# Patient Record
Sex: Female | Born: 1935 | Race: White | Hispanic: No | Smoking: Never smoker
Health system: Southern US, Community
[De-identification: ages and names within clinical notes are randomized; demographics above are authoritative.]

## PROBLEM LIST (undated history)

## (undated) DIAGNOSIS — F419 Anxiety disorder, unspecified: Secondary | ICD-10-CM

## (undated) DIAGNOSIS — I2510 Atherosclerotic heart disease of native coronary artery without angina pectoris: Secondary | ICD-10-CM

## (undated) DIAGNOSIS — E119 Type 2 diabetes mellitus without complications: Secondary | ICD-10-CM

## (undated) DIAGNOSIS — I10 Essential (primary) hypertension: Secondary | ICD-10-CM

## (undated) DIAGNOSIS — N289 Disorder of kidney and ureter, unspecified: Secondary | ICD-10-CM

## (undated) DIAGNOSIS — K219 Gastro-esophageal reflux disease without esophagitis: Secondary | ICD-10-CM

## (undated) HISTORY — PX: CATARACT EXTRACTION: SUR2

---

## 2011-05-16 DIAGNOSIS — Z8249 Family history of ischemic heart disease and other diseases of the circulatory system: Secondary | ICD-10-CM | POA: Insufficient documentation

## 2011-05-16 DIAGNOSIS — E789 Disorder of lipoprotein metabolism, unspecified: Secondary | ICD-10-CM | POA: Insufficient documentation

## 2012-05-07 DIAGNOSIS — M3501 Sicca syndrome with keratoconjunctivitis: Secondary | ICD-10-CM | POA: Insufficient documentation

## 2012-05-07 DIAGNOSIS — H16223 Keratoconjunctivitis sicca, not specified as Sjogren's, bilateral: Secondary | ICD-10-CM | POA: Insufficient documentation

## 2012-05-07 DIAGNOSIS — H4010X0 Unspecified open-angle glaucoma, stage unspecified: Secondary | ICD-10-CM | POA: Insufficient documentation

## 2012-05-07 DIAGNOSIS — H02889 Meibomian gland dysfunction of unspecified eye, unspecified eyelid: Secondary | ICD-10-CM | POA: Insufficient documentation

## 2015-08-31 DIAGNOSIS — R0600 Dyspnea, unspecified: Secondary | ICD-10-CM | POA: Insufficient documentation

## 2015-09-01 DIAGNOSIS — R251 Tremor, unspecified: Secondary | ICD-10-CM | POA: Insufficient documentation

## 2015-10-06 DIAGNOSIS — I472 Ventricular tachycardia, unspecified: Secondary | ICD-10-CM | POA: Insufficient documentation

## 2015-11-15 DIAGNOSIS — R0600 Dyspnea, unspecified: Secondary | ICD-10-CM | POA: Diagnosis not present

## 2015-11-15 DIAGNOSIS — E119 Type 2 diabetes mellitus without complications: Secondary | ICD-10-CM | POA: Diagnosis not present

## 2015-11-15 DIAGNOSIS — I471 Supraventricular tachycardia: Secondary | ICD-10-CM | POA: Diagnosis not present

## 2015-11-15 DIAGNOSIS — I472 Ventricular tachycardia: Secondary | ICD-10-CM | POA: Diagnosis not present

## 2015-11-15 DIAGNOSIS — Z6831 Body mass index (BMI) 31.0-31.9, adult: Secondary | ICD-10-CM | POA: Diagnosis not present

## 2015-11-15 DIAGNOSIS — R251 Tremor, unspecified: Secondary | ICD-10-CM | POA: Diagnosis not present

## 2015-11-15 DIAGNOSIS — E785 Hyperlipidemia, unspecified: Secondary | ICD-10-CM | POA: Diagnosis not present

## 2015-11-15 DIAGNOSIS — I10 Essential (primary) hypertension: Secondary | ICD-10-CM | POA: Diagnosis not present

## 2015-11-26 DIAGNOSIS — R42 Dizziness and giddiness: Secondary | ICD-10-CM | POA: Diagnosis not present

## 2015-11-26 DIAGNOSIS — H6901 Patulous Eustachian tube, right ear: Secondary | ICD-10-CM | POA: Diagnosis not present

## 2015-11-26 DIAGNOSIS — H903 Sensorineural hearing loss, bilateral: Secondary | ICD-10-CM | POA: Diagnosis not present

## 2015-11-26 DIAGNOSIS — H9313 Tinnitus, bilateral: Secondary | ICD-10-CM | POA: Diagnosis not present

## 2015-12-20 DIAGNOSIS — J019 Acute sinusitis, unspecified: Secondary | ICD-10-CM | POA: Diagnosis not present

## 2015-12-20 DIAGNOSIS — E119 Type 2 diabetes mellitus without complications: Secondary | ICD-10-CM | POA: Diagnosis not present

## 2015-12-20 DIAGNOSIS — Z719 Counseling, unspecified: Secondary | ICD-10-CM | POA: Diagnosis not present

## 2016-01-25 DIAGNOSIS — E113291 Type 2 diabetes mellitus with mild nonproliferative diabetic retinopathy without macular edema, right eye: Secondary | ICD-10-CM | POA: Diagnosis not present

## 2016-01-25 DIAGNOSIS — Z961 Presence of intraocular lens: Secondary | ICD-10-CM | POA: Diagnosis not present

## 2016-01-25 DIAGNOSIS — H35372 Puckering of macula, left eye: Secondary | ICD-10-CM | POA: Diagnosis not present

## 2016-01-25 DIAGNOSIS — H2640 Unspecified secondary cataract: Secondary | ICD-10-CM | POA: Diagnosis not present

## 2016-01-25 DIAGNOSIS — H16223 Keratoconjunctivitis sicca, not specified as Sjogren's, bilateral: Secondary | ICD-10-CM | POA: Diagnosis not present

## 2016-01-26 DIAGNOSIS — G500 Trigeminal neuralgia: Secondary | ICD-10-CM | POA: Diagnosis not present

## 2016-01-26 DIAGNOSIS — R5383 Other fatigue: Secondary | ICD-10-CM | POA: Diagnosis not present

## 2016-01-26 DIAGNOSIS — I10 Essential (primary) hypertension: Secondary | ICD-10-CM | POA: Diagnosis not present

## 2016-01-26 DIAGNOSIS — E118 Type 2 diabetes mellitus with unspecified complications: Secondary | ICD-10-CM | POA: Diagnosis not present

## 2016-01-26 DIAGNOSIS — E7800 Pure hypercholesterolemia, unspecified: Secondary | ICD-10-CM | POA: Diagnosis not present

## 2016-01-26 DIAGNOSIS — R5382 Chronic fatigue, unspecified: Secondary | ICD-10-CM | POA: Insufficient documentation

## 2016-02-23 DIAGNOSIS — R2689 Other abnormalities of gait and mobility: Secondary | ICD-10-CM | POA: Diagnosis not present

## 2016-02-23 DIAGNOSIS — E1142 Type 2 diabetes mellitus with diabetic polyneuropathy: Secondary | ICD-10-CM | POA: Diagnosis not present

## 2016-03-02 DIAGNOSIS — M791 Myalgia: Secondary | ICD-10-CM | POA: Diagnosis not present

## 2016-03-02 DIAGNOSIS — G500 Trigeminal neuralgia: Secondary | ICD-10-CM | POA: Diagnosis not present

## 2016-03-06 DIAGNOSIS — R5381 Other malaise: Secondary | ICD-10-CM | POA: Insufficient documentation

## 2016-03-06 DIAGNOSIS — I10 Essential (primary) hypertension: Secondary | ICD-10-CM | POA: Diagnosis not present

## 2016-03-06 DIAGNOSIS — R251 Tremor, unspecified: Secondary | ICD-10-CM | POA: Diagnosis not present

## 2016-03-06 DIAGNOSIS — Z6831 Body mass index (BMI) 31.0-31.9, adult: Secondary | ICD-10-CM | POA: Diagnosis not present

## 2016-03-06 DIAGNOSIS — E785 Hyperlipidemia, unspecified: Secondary | ICD-10-CM | POA: Diagnosis not present

## 2016-03-06 DIAGNOSIS — I471 Supraventricular tachycardia: Secondary | ICD-10-CM | POA: Diagnosis not present

## 2016-03-06 DIAGNOSIS — I472 Ventricular tachycardia: Secondary | ICD-10-CM | POA: Diagnosis not present

## 2016-03-06 DIAGNOSIS — R5383 Other fatigue: Secondary | ICD-10-CM | POA: Diagnosis not present

## 2016-03-09 DIAGNOSIS — R7989 Other specified abnormal findings of blood chemistry: Secondary | ICD-10-CM | POA: Diagnosis not present

## 2016-03-09 DIAGNOSIS — R0600 Dyspnea, unspecified: Secondary | ICD-10-CM | POA: Diagnosis not present

## 2016-03-09 DIAGNOSIS — R791 Abnormal coagulation profile: Secondary | ICD-10-CM | POA: Diagnosis not present

## 2016-03-17 DIAGNOSIS — I472 Ventricular tachycardia: Secondary | ICD-10-CM | POA: Diagnosis not present

## 2016-03-17 DIAGNOSIS — I471 Supraventricular tachycardia: Secondary | ICD-10-CM | POA: Diagnosis not present

## 2016-03-17 DIAGNOSIS — I10 Essential (primary) hypertension: Secondary | ICD-10-CM | POA: Diagnosis not present

## 2016-04-03 DIAGNOSIS — M5442 Lumbago with sciatica, left side: Secondary | ICD-10-CM | POA: Diagnosis not present

## 2016-04-03 DIAGNOSIS — G500 Trigeminal neuralgia: Secondary | ICD-10-CM | POA: Diagnosis not present

## 2016-04-03 DIAGNOSIS — R531 Weakness: Secondary | ICD-10-CM | POA: Diagnosis not present

## 2016-04-03 DIAGNOSIS — K210 Gastro-esophageal reflux disease with esophagitis: Secondary | ICD-10-CM | POA: Diagnosis not present

## 2016-04-03 DIAGNOSIS — G8929 Other chronic pain: Secondary | ICD-10-CM | POA: Diagnosis not present

## 2016-04-03 DIAGNOSIS — M25552 Pain in left hip: Secondary | ICD-10-CM | POA: Diagnosis not present

## 2016-04-03 DIAGNOSIS — Z0000 Encounter for general adult medical examination without abnormal findings: Secondary | ICD-10-CM | POA: Diagnosis not present

## 2016-04-03 DIAGNOSIS — R609 Edema, unspecified: Secondary | ICD-10-CM | POA: Diagnosis not present

## 2016-04-03 DIAGNOSIS — I509 Heart failure, unspecified: Secondary | ICD-10-CM | POA: Diagnosis not present

## 2016-04-03 DIAGNOSIS — M5441 Lumbago with sciatica, right side: Secondary | ICD-10-CM | POA: Diagnosis not present

## 2016-04-03 DIAGNOSIS — M25551 Pain in right hip: Secondary | ICD-10-CM | POA: Diagnosis not present

## 2016-04-25 DIAGNOSIS — I509 Heart failure, unspecified: Secondary | ICD-10-CM | POA: Diagnosis not present

## 2016-04-25 DIAGNOSIS — I110 Hypertensive heart disease with heart failure: Secondary | ICD-10-CM | POA: Diagnosis not present

## 2016-04-25 DIAGNOSIS — E1142 Type 2 diabetes mellitus with diabetic polyneuropathy: Secondary | ICD-10-CM | POA: Diagnosis not present

## 2016-04-25 DIAGNOSIS — Z7984 Long term (current) use of oral hypoglycemic drugs: Secondary | ICD-10-CM | POA: Diagnosis not present

## 2016-04-27 DIAGNOSIS — I509 Heart failure, unspecified: Secondary | ICD-10-CM | POA: Diagnosis not present

## 2016-04-27 DIAGNOSIS — I110 Hypertensive heart disease with heart failure: Secondary | ICD-10-CM | POA: Diagnosis not present

## 2016-04-27 DIAGNOSIS — E1142 Type 2 diabetes mellitus with diabetic polyneuropathy: Secondary | ICD-10-CM | POA: Diagnosis not present

## 2016-04-27 DIAGNOSIS — Z7984 Long term (current) use of oral hypoglycemic drugs: Secondary | ICD-10-CM | POA: Diagnosis not present

## 2016-04-28 DIAGNOSIS — E1142 Type 2 diabetes mellitus with diabetic polyneuropathy: Secondary | ICD-10-CM | POA: Diagnosis not present

## 2016-04-28 DIAGNOSIS — I509 Heart failure, unspecified: Secondary | ICD-10-CM | POA: Diagnosis not present

## 2016-04-28 DIAGNOSIS — Z7984 Long term (current) use of oral hypoglycemic drugs: Secondary | ICD-10-CM | POA: Diagnosis not present

## 2016-04-28 DIAGNOSIS — I110 Hypertensive heart disease with heart failure: Secondary | ICD-10-CM | POA: Diagnosis not present

## 2016-05-01 DIAGNOSIS — Z7984 Long term (current) use of oral hypoglycemic drugs: Secondary | ICD-10-CM | POA: Diagnosis not present

## 2016-05-01 DIAGNOSIS — I509 Heart failure, unspecified: Secondary | ICD-10-CM | POA: Diagnosis not present

## 2016-05-01 DIAGNOSIS — I110 Hypertensive heart disease with heart failure: Secondary | ICD-10-CM | POA: Diagnosis not present

## 2016-05-01 DIAGNOSIS — E1142 Type 2 diabetes mellitus with diabetic polyneuropathy: Secondary | ICD-10-CM | POA: Diagnosis not present

## 2016-05-04 DIAGNOSIS — E1142 Type 2 diabetes mellitus with diabetic polyneuropathy: Secondary | ICD-10-CM | POA: Diagnosis not present

## 2016-05-04 DIAGNOSIS — Z7984 Long term (current) use of oral hypoglycemic drugs: Secondary | ICD-10-CM | POA: Diagnosis not present

## 2016-05-04 DIAGNOSIS — I110 Hypertensive heart disease with heart failure: Secondary | ICD-10-CM | POA: Diagnosis not present

## 2016-05-04 DIAGNOSIS — I509 Heart failure, unspecified: Secondary | ICD-10-CM | POA: Diagnosis not present

## 2016-05-09 DIAGNOSIS — I110 Hypertensive heart disease with heart failure: Secondary | ICD-10-CM | POA: Diagnosis not present

## 2016-05-09 DIAGNOSIS — E1142 Type 2 diabetes mellitus with diabetic polyneuropathy: Secondary | ICD-10-CM | POA: Diagnosis not present

## 2016-05-09 DIAGNOSIS — I509 Heart failure, unspecified: Secondary | ICD-10-CM | POA: Diagnosis not present

## 2016-05-09 DIAGNOSIS — Z7984 Long term (current) use of oral hypoglycemic drugs: Secondary | ICD-10-CM | POA: Diagnosis not present

## 2016-05-11 DIAGNOSIS — I110 Hypertensive heart disease with heart failure: Secondary | ICD-10-CM | POA: Diagnosis not present

## 2016-05-11 DIAGNOSIS — E1142 Type 2 diabetes mellitus with diabetic polyneuropathy: Secondary | ICD-10-CM | POA: Diagnosis not present

## 2016-05-11 DIAGNOSIS — I509 Heart failure, unspecified: Secondary | ICD-10-CM | POA: Diagnosis not present

## 2016-05-11 DIAGNOSIS — Z7984 Long term (current) use of oral hypoglycemic drugs: Secondary | ICD-10-CM | POA: Diagnosis not present

## 2016-05-15 DIAGNOSIS — Z888 Allergy status to other drugs, medicaments and biological substances status: Secondary | ICD-10-CM | POA: Diagnosis not present

## 2016-05-15 DIAGNOSIS — I509 Heart failure, unspecified: Secondary | ICD-10-CM | POA: Diagnosis not present

## 2016-05-15 DIAGNOSIS — Z7984 Long term (current) use of oral hypoglycemic drugs: Secondary | ICD-10-CM | POA: Diagnosis not present

## 2016-05-15 DIAGNOSIS — M3501 Sicca syndrome with keratoconjunctivitis: Secondary | ICD-10-CM | POA: Diagnosis not present

## 2016-05-15 DIAGNOSIS — E113299 Type 2 diabetes mellitus with mild nonproliferative diabetic retinopathy without macular edema, unspecified eye: Secondary | ICD-10-CM | POA: Diagnosis not present

## 2016-05-15 DIAGNOSIS — Z7982 Long term (current) use of aspirin: Secondary | ICD-10-CM | POA: Diagnosis not present

## 2016-05-15 DIAGNOSIS — E7800 Pure hypercholesterolemia, unspecified: Secondary | ICD-10-CM | POA: Diagnosis not present

## 2016-05-15 DIAGNOSIS — Z79899 Other long term (current) drug therapy: Secondary | ICD-10-CM | POA: Diagnosis not present

## 2016-05-15 DIAGNOSIS — E1140 Type 2 diabetes mellitus with diabetic neuropathy, unspecified: Secondary | ICD-10-CM | POA: Diagnosis not present

## 2016-05-15 DIAGNOSIS — H538 Other visual disturbances: Secondary | ICD-10-CM | POA: Diagnosis not present

## 2016-05-15 DIAGNOSIS — Z961 Presence of intraocular lens: Secondary | ICD-10-CM | POA: Diagnosis not present

## 2016-05-15 DIAGNOSIS — H539 Unspecified visual disturbance: Secondary | ICD-10-CM | POA: Diagnosis not present

## 2016-05-15 DIAGNOSIS — I110 Hypertensive heart disease with heart failure: Secondary | ICD-10-CM | POA: Diagnosis not present

## 2016-05-16 DIAGNOSIS — E1142 Type 2 diabetes mellitus with diabetic polyneuropathy: Secondary | ICD-10-CM | POA: Diagnosis not present

## 2016-05-16 DIAGNOSIS — Z7984 Long term (current) use of oral hypoglycemic drugs: Secondary | ICD-10-CM | POA: Diagnosis not present

## 2016-05-16 DIAGNOSIS — I509 Heart failure, unspecified: Secondary | ICD-10-CM | POA: Diagnosis not present

## 2016-05-16 DIAGNOSIS — I110 Hypertensive heart disease with heart failure: Secondary | ICD-10-CM | POA: Diagnosis not present

## 2016-05-19 DIAGNOSIS — I509 Heart failure, unspecified: Secondary | ICD-10-CM | POA: Diagnosis not present

## 2016-05-19 DIAGNOSIS — Z7984 Long term (current) use of oral hypoglycemic drugs: Secondary | ICD-10-CM | POA: Diagnosis not present

## 2016-05-19 DIAGNOSIS — I110 Hypertensive heart disease with heart failure: Secondary | ICD-10-CM | POA: Diagnosis not present

## 2016-05-19 DIAGNOSIS — E1142 Type 2 diabetes mellitus with diabetic polyneuropathy: Secondary | ICD-10-CM | POA: Diagnosis not present

## 2016-05-23 DIAGNOSIS — I509 Heart failure, unspecified: Secondary | ICD-10-CM | POA: Diagnosis not present

## 2016-05-23 DIAGNOSIS — E1142 Type 2 diabetes mellitus with diabetic polyneuropathy: Secondary | ICD-10-CM | POA: Diagnosis not present

## 2016-05-23 DIAGNOSIS — Z7984 Long term (current) use of oral hypoglycemic drugs: Secondary | ICD-10-CM | POA: Diagnosis not present

## 2016-05-23 DIAGNOSIS — I110 Hypertensive heart disease with heart failure: Secondary | ICD-10-CM | POA: Diagnosis not present

## 2016-05-25 DIAGNOSIS — I110 Hypertensive heart disease with heart failure: Secondary | ICD-10-CM | POA: Diagnosis not present

## 2016-05-25 DIAGNOSIS — I509 Heart failure, unspecified: Secondary | ICD-10-CM | POA: Diagnosis not present

## 2016-05-25 DIAGNOSIS — Z7984 Long term (current) use of oral hypoglycemic drugs: Secondary | ICD-10-CM | POA: Diagnosis not present

## 2016-05-25 DIAGNOSIS — E1142 Type 2 diabetes mellitus with diabetic polyneuropathy: Secondary | ICD-10-CM | POA: Diagnosis not present

## 2016-05-26 DIAGNOSIS — K219 Gastro-esophageal reflux disease without esophagitis: Secondary | ICD-10-CM | POA: Diagnosis not present

## 2016-05-30 DIAGNOSIS — I110 Hypertensive heart disease with heart failure: Secondary | ICD-10-CM | POA: Diagnosis not present

## 2016-05-30 DIAGNOSIS — I509 Heart failure, unspecified: Secondary | ICD-10-CM | POA: Diagnosis not present

## 2016-05-30 DIAGNOSIS — E1142 Type 2 diabetes mellitus with diabetic polyneuropathy: Secondary | ICD-10-CM | POA: Diagnosis not present

## 2016-05-30 DIAGNOSIS — Z7984 Long term (current) use of oral hypoglycemic drugs: Secondary | ICD-10-CM | POA: Diagnosis not present

## 2016-05-31 DIAGNOSIS — I10 Essential (primary) hypertension: Secondary | ICD-10-CM | POA: Diagnosis not present

## 2016-05-31 DIAGNOSIS — I471 Supraventricular tachycardia: Secondary | ICD-10-CM | POA: Diagnosis not present

## 2016-05-31 DIAGNOSIS — Z6831 Body mass index (BMI) 31.0-31.9, adult: Secondary | ICD-10-CM | POA: Diagnosis not present

## 2016-05-31 DIAGNOSIS — E785 Hyperlipidemia, unspecified: Secondary | ICD-10-CM | POA: Diagnosis not present

## 2016-05-31 DIAGNOSIS — R0600 Dyspnea, unspecified: Secondary | ICD-10-CM | POA: Diagnosis not present

## 2016-05-31 DIAGNOSIS — E119 Type 2 diabetes mellitus without complications: Secondary | ICD-10-CM | POA: Diagnosis not present

## 2016-05-31 DIAGNOSIS — R5381 Other malaise: Secondary | ICD-10-CM | POA: Diagnosis not present

## 2016-05-31 DIAGNOSIS — R5383 Other fatigue: Secondary | ICD-10-CM | POA: Diagnosis not present

## 2016-05-31 DIAGNOSIS — R251 Tremor, unspecified: Secondary | ICD-10-CM | POA: Diagnosis not present

## 2016-06-02 DIAGNOSIS — E1142 Type 2 diabetes mellitus with diabetic polyneuropathy: Secondary | ICD-10-CM | POA: Diagnosis not present

## 2016-06-02 DIAGNOSIS — Z7984 Long term (current) use of oral hypoglycemic drugs: Secondary | ICD-10-CM | POA: Diagnosis not present

## 2016-06-02 DIAGNOSIS — I509 Heart failure, unspecified: Secondary | ICD-10-CM | POA: Diagnosis not present

## 2016-06-02 DIAGNOSIS — I110 Hypertensive heart disease with heart failure: Secondary | ICD-10-CM | POA: Diagnosis not present

## 2016-06-20 DIAGNOSIS — I509 Heart failure, unspecified: Secondary | ICD-10-CM | POA: Diagnosis not present

## 2016-06-20 DIAGNOSIS — I110 Hypertensive heart disease with heart failure: Secondary | ICD-10-CM | POA: Diagnosis not present

## 2016-06-20 DIAGNOSIS — Z7984 Long term (current) use of oral hypoglycemic drugs: Secondary | ICD-10-CM | POA: Diagnosis not present

## 2016-06-20 DIAGNOSIS — E1142 Type 2 diabetes mellitus with diabetic polyneuropathy: Secondary | ICD-10-CM | POA: Diagnosis not present

## 2016-06-24 DIAGNOSIS — Z7984 Long term (current) use of oral hypoglycemic drugs: Secondary | ICD-10-CM | POA: Diagnosis not present

## 2016-06-24 DIAGNOSIS — I509 Heart failure, unspecified: Secondary | ICD-10-CM | POA: Diagnosis not present

## 2016-06-24 DIAGNOSIS — I110 Hypertensive heart disease with heart failure: Secondary | ICD-10-CM | POA: Diagnosis not present

## 2016-06-24 DIAGNOSIS — E1142 Type 2 diabetes mellitus with diabetic polyneuropathy: Secondary | ICD-10-CM | POA: Diagnosis not present

## 2016-06-27 DIAGNOSIS — E1142 Type 2 diabetes mellitus with diabetic polyneuropathy: Secondary | ICD-10-CM | POA: Diagnosis not present

## 2016-06-27 DIAGNOSIS — I509 Heart failure, unspecified: Secondary | ICD-10-CM | POA: Diagnosis not present

## 2016-06-27 DIAGNOSIS — I110 Hypertensive heart disease with heart failure: Secondary | ICD-10-CM | POA: Diagnosis not present

## 2016-06-27 DIAGNOSIS — Z7984 Long term (current) use of oral hypoglycemic drugs: Secondary | ICD-10-CM | POA: Diagnosis not present

## 2016-07-04 DIAGNOSIS — E1165 Type 2 diabetes mellitus with hyperglycemia: Secondary | ICD-10-CM | POA: Diagnosis not present

## 2016-07-04 DIAGNOSIS — K2100 Gastro-esophageal reflux disease with esophagitis, without bleeding: Secondary | ICD-10-CM | POA: Insufficient documentation

## 2016-07-04 DIAGNOSIS — E1149 Type 2 diabetes mellitus with other diabetic neurological complication: Secondary | ICD-10-CM | POA: Diagnosis not present

## 2016-07-04 DIAGNOSIS — E1140 Type 2 diabetes mellitus with diabetic neuropathy, unspecified: Secondary | ICD-10-CM | POA: Diagnosis not present

## 2016-07-04 DIAGNOSIS — E1169 Type 2 diabetes mellitus with other specified complication: Secondary | ICD-10-CM | POA: Diagnosis not present

## 2016-07-04 DIAGNOSIS — I10 Essential (primary) hypertension: Secondary | ICD-10-CM | POA: Diagnosis not present

## 2016-07-04 DIAGNOSIS — E785 Hyperlipidemia, unspecified: Secondary | ICD-10-CM | POA: Diagnosis not present

## 2016-07-04 DIAGNOSIS — I5022 Chronic systolic (congestive) heart failure: Secondary | ICD-10-CM | POA: Diagnosis not present

## 2016-07-06 DIAGNOSIS — I110 Hypertensive heart disease with heart failure: Secondary | ICD-10-CM | POA: Diagnosis not present

## 2016-07-06 DIAGNOSIS — Z7984 Long term (current) use of oral hypoglycemic drugs: Secondary | ICD-10-CM | POA: Diagnosis not present

## 2016-07-06 DIAGNOSIS — E1142 Type 2 diabetes mellitus with diabetic polyneuropathy: Secondary | ICD-10-CM | POA: Diagnosis not present

## 2016-07-06 DIAGNOSIS — I509 Heart failure, unspecified: Secondary | ICD-10-CM | POA: Diagnosis not present

## 2016-07-07 DIAGNOSIS — G629 Polyneuropathy, unspecified: Secondary | ICD-10-CM | POA: Diagnosis not present

## 2016-07-07 DIAGNOSIS — R293 Abnormal posture: Secondary | ICD-10-CM | POA: Diagnosis not present

## 2016-07-10 DIAGNOSIS — I10 Essential (primary) hypertension: Secondary | ICD-10-CM | POA: Diagnosis not present

## 2016-07-10 DIAGNOSIS — R251 Tremor, unspecified: Secondary | ICD-10-CM | POA: Diagnosis not present

## 2016-07-13 DIAGNOSIS — Z7984 Long term (current) use of oral hypoglycemic drugs: Secondary | ICD-10-CM | POA: Diagnosis not present

## 2016-07-13 DIAGNOSIS — E1142 Type 2 diabetes mellitus with diabetic polyneuropathy: Secondary | ICD-10-CM | POA: Diagnosis not present

## 2016-07-13 DIAGNOSIS — I110 Hypertensive heart disease with heart failure: Secondary | ICD-10-CM | POA: Diagnosis not present

## 2016-07-13 DIAGNOSIS — I509 Heart failure, unspecified: Secondary | ICD-10-CM | POA: Diagnosis not present

## 2016-07-18 DIAGNOSIS — E1142 Type 2 diabetes mellitus with diabetic polyneuropathy: Secondary | ICD-10-CM | POA: Diagnosis not present

## 2016-07-18 DIAGNOSIS — I509 Heart failure, unspecified: Secondary | ICD-10-CM | POA: Diagnosis not present

## 2016-07-18 DIAGNOSIS — Z7984 Long term (current) use of oral hypoglycemic drugs: Secondary | ICD-10-CM | POA: Diagnosis not present

## 2016-07-18 DIAGNOSIS — I110 Hypertensive heart disease with heart failure: Secondary | ICD-10-CM | POA: Diagnosis not present

## 2016-07-25 DIAGNOSIS — I110 Hypertensive heart disease with heart failure: Secondary | ICD-10-CM | POA: Diagnosis not present

## 2016-07-25 DIAGNOSIS — I509 Heart failure, unspecified: Secondary | ICD-10-CM | POA: Diagnosis not present

## 2016-07-25 DIAGNOSIS — E1142 Type 2 diabetes mellitus with diabetic polyneuropathy: Secondary | ICD-10-CM | POA: Diagnosis not present

## 2016-07-25 DIAGNOSIS — Z7984 Long term (current) use of oral hypoglycemic drugs: Secondary | ICD-10-CM | POA: Diagnosis not present

## 2016-08-03 DIAGNOSIS — I509 Heart failure, unspecified: Secondary | ICD-10-CM | POA: Diagnosis not present

## 2016-08-03 DIAGNOSIS — Z7984 Long term (current) use of oral hypoglycemic drugs: Secondary | ICD-10-CM | POA: Diagnosis not present

## 2016-08-03 DIAGNOSIS — E1142 Type 2 diabetes mellitus with diabetic polyneuropathy: Secondary | ICD-10-CM | POA: Diagnosis not present

## 2016-08-03 DIAGNOSIS — I110 Hypertensive heart disease with heart failure: Secondary | ICD-10-CM | POA: Diagnosis not present

## 2016-08-22 DIAGNOSIS — Z23 Encounter for immunization: Secondary | ICD-10-CM | POA: Diagnosis not present

## 2016-08-23 DIAGNOSIS — E1140 Type 2 diabetes mellitus with diabetic neuropathy, unspecified: Secondary | ICD-10-CM | POA: Diagnosis not present

## 2016-08-23 DIAGNOSIS — R2689 Other abnormalities of gait and mobility: Secondary | ICD-10-CM | POA: Diagnosis not present

## 2016-09-20 DIAGNOSIS — R0602 Shortness of breath: Secondary | ICD-10-CM | POA: Diagnosis not present

## 2016-09-20 DIAGNOSIS — R05 Cough: Secondary | ICD-10-CM | POA: Diagnosis not present

## 2016-10-03 DIAGNOSIS — Z23 Encounter for immunization: Secondary | ICD-10-CM | POA: Diagnosis not present

## 2016-10-03 DIAGNOSIS — B379 Candidiasis, unspecified: Secondary | ICD-10-CM | POA: Diagnosis not present

## 2016-10-03 DIAGNOSIS — I5042 Chronic combined systolic (congestive) and diastolic (congestive) heart failure: Secondary | ICD-10-CM | POA: Diagnosis not present

## 2016-10-03 DIAGNOSIS — E538 Deficiency of other specified B group vitamins: Secondary | ICD-10-CM | POA: Diagnosis not present

## 2016-10-03 DIAGNOSIS — E119 Type 2 diabetes mellitus without complications: Secondary | ICD-10-CM | POA: Diagnosis not present

## 2016-10-03 DIAGNOSIS — I10 Essential (primary) hypertension: Secondary | ICD-10-CM | POA: Diagnosis not present

## 2016-10-03 DIAGNOSIS — K219 Gastro-esophageal reflux disease without esophagitis: Secondary | ICD-10-CM | POA: Diagnosis not present

## 2016-10-04 DIAGNOSIS — H93A9 Pulsatile tinnitus, unspecified ear: Secondary | ICD-10-CM | POA: Diagnosis not present

## 2016-10-19 DIAGNOSIS — H8112 Benign paroxysmal vertigo, left ear: Secondary | ICD-10-CM | POA: Diagnosis not present

## 2016-10-19 DIAGNOSIS — R42 Dizziness and giddiness: Secondary | ICD-10-CM | POA: Diagnosis not present

## 2016-11-27 DIAGNOSIS — I471 Supraventricular tachycardia: Secondary | ICD-10-CM | POA: Diagnosis not present

## 2016-11-27 DIAGNOSIS — R5381 Other malaise: Secondary | ICD-10-CM | POA: Diagnosis not present

## 2016-11-27 DIAGNOSIS — R5383 Other fatigue: Secondary | ICD-10-CM | POA: Diagnosis not present

## 2016-11-27 DIAGNOSIS — Z6830 Body mass index (BMI) 30.0-30.9, adult: Secondary | ICD-10-CM | POA: Diagnosis not present

## 2016-11-27 DIAGNOSIS — R0609 Other forms of dyspnea: Secondary | ICD-10-CM | POA: Diagnosis not present

## 2016-11-27 DIAGNOSIS — R251 Tremor, unspecified: Secondary | ICD-10-CM | POA: Diagnosis not present

## 2016-11-27 DIAGNOSIS — I10 Essential (primary) hypertension: Secondary | ICD-10-CM | POA: Diagnosis not present

## 2016-12-13 DIAGNOSIS — M19042 Primary osteoarthritis, left hand: Secondary | ICD-10-CM | POA: Diagnosis not present

## 2016-12-13 DIAGNOSIS — M79642 Pain in left hand: Secondary | ICD-10-CM | POA: Diagnosis not present

## 2017-01-02 DIAGNOSIS — I110 Hypertensive heart disease with heart failure: Secondary | ICD-10-CM | POA: Diagnosis not present

## 2017-01-02 DIAGNOSIS — I10 Essential (primary) hypertension: Secondary | ICD-10-CM | POA: Diagnosis not present

## 2017-01-02 DIAGNOSIS — R4689 Other symptoms and signs involving appearance and behavior: Secondary | ICD-10-CM | POA: Diagnosis not present

## 2017-01-02 DIAGNOSIS — E559 Vitamin D deficiency, unspecified: Secondary | ICD-10-CM | POA: Diagnosis not present

## 2017-01-02 DIAGNOSIS — S61301A Unspecified open wound of left index finger with damage to nail, initial encounter: Secondary | ICD-10-CM | POA: Diagnosis not present

## 2017-01-02 DIAGNOSIS — E1142 Type 2 diabetes mellitus with diabetic polyneuropathy: Secondary | ICD-10-CM | POA: Diagnosis not present

## 2017-01-02 DIAGNOSIS — E1149 Type 2 diabetes mellitus with other diabetic neurological complication: Secondary | ICD-10-CM | POA: Diagnosis not present

## 2017-01-02 DIAGNOSIS — K210 Gastro-esophageal reflux disease with esophagitis: Secondary | ICD-10-CM | POA: Diagnosis not present

## 2017-01-02 DIAGNOSIS — I5042 Chronic combined systolic (congestive) and diastolic (congestive) heart failure: Secondary | ICD-10-CM | POA: Diagnosis not present

## 2017-01-02 DIAGNOSIS — E538 Deficiency of other specified B group vitamins: Secondary | ICD-10-CM | POA: Diagnosis not present

## 2017-01-30 DIAGNOSIS — H01005 Unspecified blepharitis left lower eyelid: Secondary | ICD-10-CM | POA: Diagnosis not present

## 2017-01-30 DIAGNOSIS — E119 Type 2 diabetes mellitus without complications: Secondary | ICD-10-CM | POA: Diagnosis not present

## 2017-01-30 DIAGNOSIS — H01002 Unspecified blepharitis right lower eyelid: Secondary | ICD-10-CM | POA: Diagnosis not present

## 2017-01-30 DIAGNOSIS — H40002 Preglaucoma, unspecified, left eye: Secondary | ICD-10-CM | POA: Diagnosis not present

## 2017-01-30 DIAGNOSIS — H01001 Unspecified blepharitis right upper eyelid: Secondary | ICD-10-CM | POA: Diagnosis not present

## 2017-01-30 DIAGNOSIS — H35372 Puckering of macula, left eye: Secondary | ICD-10-CM | POA: Diagnosis not present

## 2017-01-30 DIAGNOSIS — Z961 Presence of intraocular lens: Secondary | ICD-10-CM | POA: Diagnosis not present

## 2017-01-30 DIAGNOSIS — H01004 Unspecified blepharitis left upper eyelid: Secondary | ICD-10-CM | POA: Diagnosis not present

## 2017-02-05 DIAGNOSIS — F329 Major depressive disorder, single episode, unspecified: Secondary | ICD-10-CM | POA: Insufficient documentation

## 2017-02-05 DIAGNOSIS — M1990 Unspecified osteoarthritis, unspecified site: Secondary | ICD-10-CM | POA: Insufficient documentation

## 2017-02-05 DIAGNOSIS — E1140 Type 2 diabetes mellitus with diabetic neuropathy, unspecified: Secondary | ICD-10-CM | POA: Diagnosis not present

## 2017-02-05 DIAGNOSIS — F320 Major depressive disorder, single episode, mild: Secondary | ICD-10-CM | POA: Diagnosis not present

## 2017-02-05 DIAGNOSIS — E1149 Type 2 diabetes mellitus with other diabetic neurological complication: Secondary | ICD-10-CM | POA: Diagnosis not present

## 2017-02-05 DIAGNOSIS — I10 Essential (primary) hypertension: Secondary | ICD-10-CM | POA: Diagnosis not present

## 2017-02-21 DIAGNOSIS — G500 Trigeminal neuralgia: Secondary | ICD-10-CM | POA: Diagnosis not present

## 2017-02-21 DIAGNOSIS — E1140 Type 2 diabetes mellitus with diabetic neuropathy, unspecified: Secondary | ICD-10-CM | POA: Diagnosis not present

## 2017-02-21 DIAGNOSIS — E1149 Type 2 diabetes mellitus with other diabetic neurological complication: Secondary | ICD-10-CM | POA: Diagnosis not present

## 2017-03-12 DIAGNOSIS — E1140 Type 2 diabetes mellitus with diabetic neuropathy, unspecified: Secondary | ICD-10-CM | POA: Diagnosis not present

## 2017-03-12 DIAGNOSIS — R5383 Other fatigue: Secondary | ICD-10-CM | POA: Diagnosis not present

## 2017-03-12 DIAGNOSIS — I10 Essential (primary) hypertension: Secondary | ICD-10-CM | POA: Diagnosis not present

## 2017-03-12 DIAGNOSIS — E1149 Type 2 diabetes mellitus with other diabetic neurological complication: Secondary | ICD-10-CM | POA: Diagnosis not present

## 2017-03-19 DIAGNOSIS — J019 Acute sinusitis, unspecified: Secondary | ICD-10-CM | POA: Diagnosis not present

## 2017-05-31 DIAGNOSIS — R5382 Chronic fatigue, unspecified: Secondary | ICD-10-CM | POA: Diagnosis not present

## 2017-05-31 DIAGNOSIS — E1142 Type 2 diabetes mellitus with diabetic polyneuropathy: Secondary | ICD-10-CM | POA: Diagnosis not present

## 2017-05-31 DIAGNOSIS — K210 Gastro-esophageal reflux disease with esophagitis: Secondary | ICD-10-CM | POA: Diagnosis not present

## 2017-05-31 DIAGNOSIS — I10 Essential (primary) hypertension: Secondary | ICD-10-CM | POA: Diagnosis not present

## 2017-09-06 DIAGNOSIS — E7800 Pure hypercholesterolemia, unspecified: Secondary | ICD-10-CM | POA: Diagnosis not present

## 2017-09-06 DIAGNOSIS — Z7984 Long term (current) use of oral hypoglycemic drugs: Secondary | ICD-10-CM | POA: Diagnosis not present

## 2017-09-06 DIAGNOSIS — I10 Essential (primary) hypertension: Secondary | ICD-10-CM | POA: Diagnosis not present

## 2017-09-06 DIAGNOSIS — G500 Trigeminal neuralgia: Secondary | ICD-10-CM | POA: Diagnosis not present

## 2017-09-06 DIAGNOSIS — E119 Type 2 diabetes mellitus without complications: Secondary | ICD-10-CM | POA: Diagnosis not present

## 2017-12-05 DIAGNOSIS — Z961 Presence of intraocular lens: Secondary | ICD-10-CM | POA: Diagnosis not present

## 2017-12-05 DIAGNOSIS — E119 Type 2 diabetes mellitus without complications: Secondary | ICD-10-CM | POA: Diagnosis not present

## 2018-01-10 DIAGNOSIS — Z79899 Other long term (current) drug therapy: Secondary | ICD-10-CM | POA: Diagnosis not present

## 2018-01-10 DIAGNOSIS — Z0000 Encounter for general adult medical examination without abnormal findings: Secondary | ICD-10-CM | POA: Diagnosis not present

## 2018-01-10 DIAGNOSIS — K219 Gastro-esophageal reflux disease without esophagitis: Secondary | ICD-10-CM | POA: Diagnosis not present

## 2018-01-10 DIAGNOSIS — Z1389 Encounter for screening for other disorder: Secondary | ICD-10-CM | POA: Diagnosis not present

## 2018-01-10 DIAGNOSIS — Z780 Asymptomatic menopausal state: Secondary | ICD-10-CM | POA: Diagnosis not present

## 2018-01-10 DIAGNOSIS — E119 Type 2 diabetes mellitus without complications: Secondary | ICD-10-CM | POA: Diagnosis not present

## 2018-01-10 DIAGNOSIS — H938X1 Other specified disorders of right ear: Secondary | ICD-10-CM | POA: Diagnosis not present

## 2018-01-10 DIAGNOSIS — E7800 Pure hypercholesterolemia, unspecified: Secondary | ICD-10-CM | POA: Diagnosis not present

## 2018-01-10 DIAGNOSIS — E1121 Type 2 diabetes mellitus with diabetic nephropathy: Secondary | ICD-10-CM | POA: Diagnosis not present

## 2018-01-10 DIAGNOSIS — Z1331 Encounter for screening for depression: Secondary | ICD-10-CM | POA: Diagnosis not present

## 2018-01-10 DIAGNOSIS — I10 Essential (primary) hypertension: Secondary | ICD-10-CM | POA: Diagnosis not present

## 2018-01-10 DIAGNOSIS — N183 Chronic kidney disease, stage 3 (moderate): Secondary | ICD-10-CM | POA: Diagnosis not present

## 2018-01-22 DIAGNOSIS — H903 Sensorineural hearing loss, bilateral: Secondary | ICD-10-CM | POA: Diagnosis not present

## 2018-01-22 DIAGNOSIS — H9311 Tinnitus, right ear: Secondary | ICD-10-CM | POA: Diagnosis not present

## 2018-01-22 DIAGNOSIS — J310 Chronic rhinitis: Secondary | ICD-10-CM | POA: Diagnosis not present

## 2018-01-22 DIAGNOSIS — H6981 Other specified disorders of Eustachian tube, right ear: Secondary | ICD-10-CM | POA: Diagnosis not present

## 2018-01-22 DIAGNOSIS — H6983 Other specified disorders of Eustachian tube, bilateral: Secondary | ICD-10-CM | POA: Diagnosis not present

## 2018-02-14 DIAGNOSIS — Z79899 Other long term (current) drug therapy: Secondary | ICD-10-CM | POA: Diagnosis not present

## 2018-03-01 ENCOUNTER — Encounter (HOSPITAL_COMMUNITY): Payer: Self-pay | Admitting: Emergency Medicine

## 2018-03-01 ENCOUNTER — Inpatient Hospital Stay (HOSPITAL_COMMUNITY)
Admission: EM | Admit: 2018-03-01 | Discharge: 2018-03-04 | DRG: 418 | Disposition: A | Discharge status: Home / Self Care | Payer: Medicare Other | Attending: Family Medicine | Admitting: Family Medicine

## 2018-03-01 ENCOUNTER — Inpatient Hospital Stay (HOSPITAL_COMMUNITY): Payer: Medicare Other

## 2018-03-01 ENCOUNTER — Emergency Department (HOSPITAL_COMMUNITY): Payer: Medicare Other

## 2018-03-01 DIAGNOSIS — N183 Chronic kidney disease, stage 3 (moderate): Secondary | ICD-10-CM | POA: Diagnosis not present

## 2018-03-01 DIAGNOSIS — K219 Gastro-esophageal reflux disease without esophagitis: Secondary | ICD-10-CM | POA: Diagnosis present

## 2018-03-01 DIAGNOSIS — I129 Hypertensive chronic kidney disease with stage 1 through stage 4 chronic kidney disease, or unspecified chronic kidney disease: Secondary | ICD-10-CM | POA: Diagnosis present

## 2018-03-01 DIAGNOSIS — K660 Peritoneal adhesions (postprocedural) (postinfection): Secondary | ICD-10-CM | POA: Diagnosis present

## 2018-03-01 DIAGNOSIS — I471 Supraventricular tachycardia, unspecified: Secondary | ICD-10-CM

## 2018-03-01 DIAGNOSIS — Z7984 Long term (current) use of oral hypoglycemic drugs: Secondary | ICD-10-CM

## 2018-03-01 DIAGNOSIS — K810 Acute cholecystitis: Secondary | ICD-10-CM | POA: Diagnosis not present

## 2018-03-01 DIAGNOSIS — I10 Essential (primary) hypertension: Secondary | ICD-10-CM

## 2018-03-01 DIAGNOSIS — R110 Nausea: Secondary | ICD-10-CM | POA: Diagnosis not present

## 2018-03-01 DIAGNOSIS — R509 Fever, unspecified: Secondary | ICD-10-CM | POA: Diagnosis not present

## 2018-03-01 DIAGNOSIS — R0789 Other chest pain: Secondary | ICD-10-CM | POA: Diagnosis not present

## 2018-03-01 DIAGNOSIS — E119 Type 2 diabetes mellitus without complications: Secondary | ICD-10-CM | POA: Diagnosis not present

## 2018-03-01 DIAGNOSIS — Z66 Do not resuscitate: Secondary | ICD-10-CM | POA: Diagnosis not present

## 2018-03-01 DIAGNOSIS — E1122 Type 2 diabetes mellitus with diabetic chronic kidney disease: Secondary | ICD-10-CM | POA: Diagnosis present

## 2018-03-01 DIAGNOSIS — Z888 Allergy status to other drugs, medicaments and biological substances status: Secondary | ICD-10-CM | POA: Diagnosis not present

## 2018-03-01 DIAGNOSIS — G500 Trigeminal neuralgia: Secondary | ICD-10-CM | POA: Diagnosis not present

## 2018-03-01 DIAGNOSIS — K8012 Calculus of gallbladder with acute and chronic cholecystitis without obstruction: Secondary | ICD-10-CM | POA: Diagnosis not present

## 2018-03-01 DIAGNOSIS — E118 Type 2 diabetes mellitus with unspecified complications: Secondary | ICD-10-CM

## 2018-03-01 DIAGNOSIS — Z79899 Other long term (current) drug therapy: Secondary | ICD-10-CM | POA: Diagnosis not present

## 2018-03-01 DIAGNOSIS — R1013 Epigastric pain: Secondary | ICD-10-CM | POA: Diagnosis not present

## 2018-03-01 DIAGNOSIS — K8000 Calculus of gallbladder with acute cholecystitis without obstruction: Principal | ICD-10-CM | POA: Diagnosis present

## 2018-03-01 DIAGNOSIS — K8020 Calculus of gallbladder without cholecystitis without obstruction: Secondary | ICD-10-CM | POA: Diagnosis not present

## 2018-03-01 DIAGNOSIS — K819 Cholecystitis, unspecified: Secondary | ICD-10-CM

## 2018-03-01 DIAGNOSIS — N179 Acute kidney failure, unspecified: Secondary | ICD-10-CM

## 2018-03-01 DIAGNOSIS — I2510 Atherosclerotic heart disease of native coronary artery without angina pectoris: Secondary | ICD-10-CM | POA: Diagnosis not present

## 2018-03-01 DIAGNOSIS — R079 Chest pain, unspecified: Secondary | ICD-10-CM | POA: Diagnosis not present

## 2018-03-01 DIAGNOSIS — K8066 Calculus of gallbladder and bile duct with acute and chronic cholecystitis without obstruction: Secondary | ICD-10-CM | POA: Diagnosis not present

## 2018-03-01 DIAGNOSIS — R1084 Generalized abdominal pain: Secondary | ICD-10-CM | POA: Diagnosis not present

## 2018-03-01 DIAGNOSIS — F411 Generalized anxiety disorder: Secondary | ICD-10-CM

## 2018-03-01 DIAGNOSIS — K760 Fatty (change of) liver, not elsewhere classified: Secondary | ICD-10-CM | POA: Diagnosis not present

## 2018-03-01 HISTORY — DX: Essential (primary) hypertension: I10

## 2018-03-01 HISTORY — DX: Atherosclerotic heart disease of native coronary artery without angina pectoris: I25.10

## 2018-03-01 HISTORY — DX: Type 2 diabetes mellitus without complications: E11.9

## 2018-03-01 HISTORY — DX: Disorder of kidney and ureter, unspecified: N28.9

## 2018-03-01 HISTORY — DX: Gastro-esophageal reflux disease without esophagitis: K21.9

## 2018-03-01 HISTORY — DX: Anxiety disorder, unspecified: F41.9

## 2018-03-01 LAB — COMPREHENSIVE METABOLIC PANEL
ALK PHOS: 56 U/L (ref 38–126)
ALT: 65 U/L — ABNORMAL HIGH (ref 14–54)
AST: 57 U/L — AB (ref 15–41)
Albumin: 3.6 g/dL (ref 3.5–5.0)
Anion gap: 11 (ref 5–15)
BILIRUBIN TOTAL: 1 mg/dL (ref 0.3–1.2)
BUN: 25 mg/dL — AB (ref 6–20)
CALCIUM: 9.4 mg/dL (ref 8.9–10.3)
CO2: 28 mmol/L (ref 22–32)
Chloride: 98 mmol/L — ABNORMAL LOW (ref 101–111)
Creatinine, Ser: 1.51 mg/dL — ABNORMAL HIGH (ref 0.44–1.00)
GFR, EST AFRICAN AMERICAN: 35 mL/min — AB (ref >60)
GFR, EST NON AFRICAN AMERICAN: 30 mL/min — AB (ref >60)
Glucose, Bld: 199 mg/dL — ABNORMAL HIGH (ref 65–99)
Potassium: 3.7 mmol/L (ref 3.5–5.1)
Sodium: 137 mmol/L (ref 135–145)
Total Protein: 7.5 g/dL (ref 6.5–8.1)

## 2018-03-01 LAB — CBC WITH DIFFERENTIAL/PLATELET
Basophils Absolute: 0 10*3/uL (ref 0.0–0.1)
Basophils Relative: 0 %
EOS PCT: 0 %
Eosinophils Absolute: 0 10*3/uL (ref 0.0–0.7)
HEMATOCRIT: 35.4 % — AB (ref 36.0–46.0)
HEMOGLOBIN: 11.6 g/dL — AB (ref 12.0–15.0)
LYMPHS ABS: 3.1 10*3/uL (ref 0.7–4.0)
LYMPHS PCT: 16 %
MCH: 31.1 pg (ref 26.0–34.0)
MCHC: 32.8 g/dL (ref 30.0–36.0)
MCV: 94.9 fL (ref 78.0–100.0)
Monocytes Absolute: 0.9 10*3/uL (ref 0.1–1.0)
Monocytes Relative: 5 %
NEUTROS ABS: 15 10*3/uL — AB (ref 1.7–7.7)
Neutrophils Relative %: 79 %
PLATELETS: 284 10*3/uL (ref 150–400)
RBC: 3.73 MIL/uL — AB (ref 3.87–5.11)
RDW: 13.2 % (ref 11.5–15.5)
WBC: 19.1 10*3/uL — AB (ref 4.0–10.5)

## 2018-03-01 LAB — LIPASE, BLOOD: LIPASE: 28 U/L (ref 11–51)

## 2018-03-01 LAB — I-STAT TROPONIN, ED: Troponin i, poc: 0.03 ng/mL (ref 0.00–0.08)

## 2018-03-01 MED ORDER — SODIUM CHLORIDE 0.9 % IV SOLN
1.0000 g | Freq: Once | INTRAVENOUS | Status: AC
  Administered 2018-03-01: 1 g via INTRAVENOUS
  Filled 2018-03-01: qty 10

## 2018-03-01 MED ORDER — PANTOPRAZOLE SODIUM 40 MG PO TBEC
40.0000 mg | DELAYED_RELEASE_TABLET | Freq: Every day | ORAL | Status: DC
  Administered 2018-03-02 – 2018-03-04 (×3): 40 mg via ORAL
  Filled 2018-03-01 (×3): qty 1

## 2018-03-01 MED ORDER — SODIUM CHLORIDE 0.9 % IV SOLN
2.0000 g | INTRAVENOUS | Status: AC
  Administered 2018-03-02 – 2018-03-03 (×2): 2 g via INTRAVENOUS
  Filled 2018-03-01 (×2): qty 20

## 2018-03-01 MED ORDER — IOHEXOL 300 MG/ML  SOLN
100.0000 mL | Freq: Once | INTRAMUSCULAR | Status: AC | PRN
  Administered 2018-03-01: 100 mL via INTRAVENOUS

## 2018-03-01 MED ORDER — GABAPENTIN 100 MG PO CAPS
100.0000 mg | ORAL_CAPSULE | Freq: Every day | ORAL | Status: DC
  Administered 2018-03-02 – 2018-03-03 (×2): 100 mg via ORAL
  Filled 2018-03-01 (×2): qty 1

## 2018-03-01 MED ORDER — DEXTROSE-NACL 5-0.45 % IV SOLN
INTRAVENOUS | Status: DC
  Administered 2018-03-02: 16:00:00 via INTRAVENOUS
  Administered 2018-03-02: 100 mL/h via INTRAVENOUS

## 2018-03-01 MED ORDER — ALPRAZOLAM 0.5 MG PO TABS
0.7500 mg | ORAL_TABLET | Freq: Every day | ORAL | Status: DC
  Administered 2018-03-02 – 2018-03-03 (×2): 0.75 mg via ORAL
  Filled 2018-03-01 (×3): qty 1

## 2018-03-01 MED ORDER — CYCLOSPORINE 0.05 % OP EMUL
1.0000 [drp] | Freq: Two times a day (BID) | OPHTHALMIC | Status: DC
  Administered 2018-03-02 – 2018-03-04 (×5): 1 [drp] via OPHTHALMIC
  Filled 2018-03-01 (×7): qty 1

## 2018-03-01 MED ORDER — INSULIN ASPART 100 UNIT/ML ~~LOC~~ SOLN
0.0000 [IU] | Freq: Three times a day (TID) | SUBCUTANEOUS | Status: DC
  Administered 2018-03-02 – 2018-03-03 (×3): 3 [IU] via SUBCUTANEOUS
  Administered 2018-03-03: 11 [IU] via SUBCUTANEOUS
  Administered 2018-03-04: 3 [IU] via SUBCUTANEOUS
  Administered 2018-03-04: 5 [IU] via SUBCUTANEOUS

## 2018-03-01 MED ORDER — SODIUM CHLORIDE 0.9 % IV SOLN
1.0000 g | INTRAVENOUS | Status: AC
  Administered 2018-03-01: 1 g via INTRAVENOUS
  Filled 2018-03-01: qty 10

## 2018-03-01 NOTE — ED Notes (Signed)
Pt was evaluated by NP

## 2018-03-01 NOTE — Consult Note (Signed)
Reason for Consult:abd pain Referring Physician: Dr Julious Oka  Nancy Webster is an 84 y.o. female.  HPI: 24 yof who lives in independent living at [facility] presents with couple days of abd pain. This initially started as chest pressure but now is diffuse abdominal pain but she states is worse in epigastrium.  Her back hurts.  She has no n/v. No change in diet. No change in bms. She states she did have fever of 101 Thursday night when her nurse checked vitals.  She was seen by Dr Felipa Eth today and referred to er. She has ct concerning for cholecystitis with elevated wbc.    Past Medical History:  Diagnosis Date  . Anxiety   . Coronary artery disease   . Diabetes mellitus without complication ([place])   . GERD (gastroesophageal reflux disease)   . Hypertension   . Renal disorder    stage 3   psh appy, gamma knife for trigeminal neuralgia, foot surgery, tonsils  No family history on file.  Social History:  has no tobacco, alcohol, and drug history on file.  Allergies:  Allergies  Allergen Reactions  . Statins Other (See Comments)    "cramps"    Medications:reviewed, on 81 mg asa  Results for orders placed or performed during the hospital encounter of 03/01/18 (from the past 48 hour(s))  CBC with Differential     Status: Abnormal   Collection Time: 03/01/18  4:56 PM  Result Value Ref Range   WBC 19.1 (H) 4.0 - 10.5 K/uL   RBC 3.73 (L) 3.87 - 5.11 MIL/uL   Hemoglobin 11.6 (L) 12.0 - 15.0 g/dL   HCT 35.4 (L) 36.0 - 46.0 %   MCV 94.9 78.0 - 100.0 fL   MCH 31.1 26.0 - 34.0 pg   MCHC 32.8 30.0 - 36.0 g/dL   RDW 13.2 11.5 - 15.5 %   Platelets 284 150 - 400 K/uL   Neutrophils Relative % 79 %   Neutro Abs 15.0 (H) 1.7 - 7.7 K/uL   Lymphocytes Relative 16 %   Lymphs Abs 3.1 0.7 - 4.0 K/uL   Monocytes Relative 5 %   Monocytes Absolute 0.9 0.1 - 1.0 K/uL   Eosinophils Relative 0 %   Eosinophils Absolute 0.0 0.0 - 0.7 K/uL   Basophils Relative 0 %   Basophils Absolute 0.0 0.0 - 0.1  K/uL    Comment: Performed at [facility], [address] [place], [place] [address]  Comprehensive metabolic panel     Status: Abnormal   Collection Time: 03/01/18  4:56 PM  Result Value Ref Range   Sodium 137 135 - 145 mmol/L   Potassium 3.7 3.5 - 5.1 mmol/L   Chloride 98 (L) 101 - 111 mmol/L   CO2 28 22 - 32 mmol/L   Glucose, Bld 199 (H) 65 - 99 mg/dL   BUN 25 (H) 6 - 20 mg/dL   Creatinine, Ser 1.51 (H) 0.44 - 1.00 mg/dL   Calcium 9.4 8.9 - 10.3 mg/dL   Total Protein 7.5 6.5 - 8.1 g/dL   Albumin 3.6 3.5 - 5.0 g/dL   AST 57 (H) 15 - 41 U/L   ALT 65 (H) 14 - 54 U/L   Alkaline Phosphatase 56 38 - 126 U/L   Total Bilirubin 1.0 0.3 - 1.2 mg/dL   GFR calc non Af Amer 30 (L) >60 mL/min   GFR calc Af Amer 35 (L) >60 mL/min    Comment: (NOTE) The eGFR has been calculated using  the CKD EPI equation. This calculation has not been validated in all clinical situations. eGFR's persistently <60 mL/min signify possible Chronic Kidney Disease.    Anion gap 11 5 - 15    Comment: Performed at [facility] [address], [place], [place] [address]  Lipase, blood     Status: None   Collection Time: 03/01/18  4:56 PM  Result Value Ref Range   Lipase 28 11 - 51 U/L    Comment: Performed at [facility] [address], [place], [place] [address]  I-stat troponin, ED     Status: None   Collection Time: 03/01/18  5:19 PM  Result Value Ref Range   Troponin i, poc 0.03 0.00 - 0.08 ng/mL   Comment 3            Comment: Due to the release kinetics of cTnI, a negative result within the first hours of the onset of symptoms does not rule out myocardial infarction with certainty. If myocardial infarction is still suspected, repeat the test at appropriate intervals.     Dg Chest 2 View  Result Date: 03/01/2018 CLINICAL DATA:  Centralized chest pressure EXAM: CHEST - 2 VIEW COMPARISON:  Report 11/03/2011 FINDINGS: Atelectasis or scarring at the left base. No pleural effusion.  Normal heart size. No pneumothorax. IMPRESSION: No active cardiopulmonary disease. Minimal atelectasis or scarring at the left base. Electronically Signed   By: Donavan Foil M.D.   On: 03/01/2018 19:19   Ct Abdomen Pelvis W Contrast  Result Date: 03/01/2018 CLINICAL DATA:  Abdominal pain, fever EXAM: CT ABDOMEN AND PELVIS WITH CONTRAST TECHNIQUE: Multidetector CT imaging of the abdomen and pelvis was performed using the standard protocol following bolus administration of intravenous contrast. CONTRAST:  171m OMNIPAQUE IOHEXOL 300 MG/ML  SOLN COMPARISON:  None. FINDINGS: Lower chest: Lung bases are clear. Hepatobiliary: Liver is within normal limits. Layering small gallstones (series 3/image 30), with mild gallbladder wall thickening/pericholecystic fluid. No intrahepatic or extrahepatic ductal dilatation. Pancreas: Within normal limits. Spleen: Within normal limits. Adrenals/Urinary Tract: Adrenal glands are within normal limits. Right kidney is within normal limits. 3.2 cm posterior interpolar left renal cyst (series 3/image 33). No hydronephrosis. Bladder is within normal limits. Stomach/Bowel: Stomach is notable for a moderate hiatal hernia. No evidence of bowel obstruction. Appendix is not discretely visualized. Sigmoid diverticulosis, without evidence of diverticulitis. Vascular/Lymphatic: No evidence of abdominal aortic aneurysm. Atherosclerotic calcifications of the abdominal aorta and branch vessels. No suspicious abdominopelvic lymphadenopathy. Reproductive: Uterus is within normal limits. Bilateral ovaries are within normal limits. Other: No abdominopelvic ascites. Musculoskeletal: Mild degenerative changes of the visualized thoracolumbar spine. IMPRESSION: Cholelithiasis with mild gallbladder wall thickening/pericholecystic fluid, raising concern for early acute cholecystitis. Consider hepatobiliary nuclear medicine scan for further evaluation, as clinically warranted. No evidence of bowel  obstruction. Appendix not discretely visualized. Sigmoid diverticulosis, without evidence of diverticulitis. Electronically Signed   By: SJulian HyM.D.   On: 03/01/2018 20:32    Review of Systems  Gastrointestinal: Positive for abdominal pain.  Musculoskeletal: Positive for back pain.  All other systems reviewed and are negative.  Blood pressure 107/60, pulse 93, temperature 99.1 F (37.3 C), temperature source Oral, resp. rate (!) 22, height 5' 3.5" (1.613 m), weight 73 kg (161 lb), SpO2 94 %. Physical Exam  Vitals reviewed. Constitutional: She is oriented to person, place, and time. She appears well-developed and well-nourished.  HENT:  Head: Normocephalic and atraumatic.  Mouth/Throat: Oropharynx is clear and moist.  Eyes: No  scleral icterus.  Neck: Neck supple.  Cardiovascular: Normal rate and regular rhythm.  Respiratory: Effort normal and breath sounds normal.  GI: Soft. Bowel sounds are normal. She exhibits no distension. There is tenderness in the right upper quadrant and epigastric area.  Lymphadenopathy:    She has no cervical adenopathy.  Neurological: She is alert and oriented to person, place, and time.  Skin: Skin is warm and dry.  Psychiatric: Her behavior is normal.    Assessment/Plan: Likely cholecystitis  I think by exam and ct she has cholecystitis.  Medicine has agreed to see and will make sure no cardiac source of pain.  Will get [place] as is better test to visualize gb.  If this appears to be gb will make npo after midnight and likely plan for cholecystectomy tomorrow. I discussed this plan with patient and her daughter.   Rolm Bookbinder 03/01/2018, 9:54 PM

## 2018-03-01 NOTE — ED Notes (Signed)
Patient transported to X-ray 

## 2018-03-01 NOTE — Progress Notes (Signed)
Pharmacy Antibiotic Note  Nancy Webster is a 84 y.o. female admitted on 03/01/2018 with intra-abdominal infection.  Pharmacy has been consulted for ceftriaxone dosing. Already given ceftriaxone 1g IV x 1 dose in the ED.  Plan: Ceftriaxone additional 1g IV x 1 now; then ceftriaxone 2g IV q24h Monitor clinical progress, LOT  Height: 5' 3.5" (161.3 cm) Weight: 161 lb (73 kg) IBW/kg (Calculated) : 53.55  Temp (24hrs), Avg:99.1 F (37.3 C), Min:99.1 F (37.3 C), Max:99.1 F (37.3 C)  Recent Labs  Lab 03/01/18 1656  WBC 19.1*  CREATININE 1.51*    Estimated Creatinine Clearance: 25.4 mL/min (A) (by C-G formula based on SCr of 1.51 mg/dL (H)).    Allergies  Allergen Reactions  . Statins Other (See Comments)    "cramps"   Elicia Lamp, PharmD, BCPS Clinical Pharmacist 03/01/2018 10:34 PM

## 2018-03-01 NOTE — ED Notes (Signed)
Taken to US at this time. 

## 2018-03-01 NOTE — H&P (Addendum)
History and Physical    Nancy Webster ZOX:[id number] DOB: 1928-09-25 DOA: 03/01/2018  PCP: Lajean Manes, MD  Patient coming from: home   Chief Complaint: abdominal pain  HPI: Nancy Webster is a 84 y.o. female with medical history significant for htn, dm, trigeminal neuralgia, paroxysmal svt, presenting with one day of abdominal pain.  New pain. Generalized upper abdominal but worse right upper quadrant. No nausea/vomiting but appetite is decreased. Is a constant pain, worse w/ movement, not associated w/ eating. No diarrhea or constipation. No chest pain or sob. No cough or pleuritic chest discomfort. Denies recent antibiotics. No hematuria or dysuria or suprapubic pain.  ED Course: ceftriaxone, imaging, labs, gen surg consult ([facility])  Review of Systems: As per HPI otherwise 10 point review of systems negative.    Past Medical History:  Diagnosis Date  . Anxiety   . Coronary artery disease   . Diabetes mellitus without complication ([place])   . GERD (gastroesophageal reflux disease)   . Hypertension   . Renal disorder    stage 3      has no tobacco, alcohol, and drug history on file.  Allergies  Allergen Reactions  . Statins Other (See Comments)    "cramps"    No family history on file.  Prior to Admission medications   Not on File    Physical Exam: Vitals:   03/01/18 1900 03/01/18 1915 03/01/18 1945 03/01/18 2030  BP: (!) 106/93  (!) 117/55 107/60  Pulse:  99 92 93  Resp:  20 (!) 29 (!) 22  Temp:      TempSrc:      SpO2:  95% 93% 94%  Weight:      Height:        Constitutional: No acute distress Head: Atraumatic Eyes: Conjunctiva clear ENM: Moist mucous membranes. Normal dentition.  Neck: Supple Respiratory: Clear to auscultation bilaterally, no wheezing/rales/rhonchi. Normal respiratory effort. No accessory muscle use. . Cardiovascular: Regular rate and rhythm. No murmurs/rubs/gallops. Abdomen: soft, ttp upper quadrants worse on the right, mild  rebound Musculoskeletal: No joint deformity upper and lower extremities. Normal ROM, no contractures. Normal muscle tone.  Skin: No rashes, lesions, or ulcers.  Extremities: No peripheral edema. Palpable peripheral pulses. Neurologic: Alert, moving all 4 extremities. Psychiatric: Normal insight and judgement.   Labs on Admission: I have personally reviewed following labs and imaging studies  CBC: Recent Labs  Lab 03/01/18 1656  WBC 19.1*  NEUTROABS 15.0*  HGB 11.6*  HCT 35.4*  MCV 94.9  PLT 811   Basic Metabolic Panel: Recent Labs  Lab 03/01/18 1656  NA 137  K 3.7  CL 98*  CO2 28  GLUCOSE 199*  BUN 25*  CREATININE 1.51*  CALCIUM 9.4   GFR: Estimated Creatinine Clearance: 25.4 mL/min (A) (by C-G formula based on SCr of 1.51 mg/dL (H)). Liver Function Tests: Recent Labs  Lab 03/01/18 1656  AST 57*  ALT 65*  ALKPHOS 56  BILITOT 1.0  PROT 7.5  ALBUMIN 3.6   Recent Labs  Lab 03/01/18 1656  LIPASE 28   No results for input(s): AMMONIA in the last 168 hours. Coagulation Profile: No results for input(s): INR, PROTIME in the last 168 hours. Cardiac Enzymes: No results for input(s): CKTOTAL, CKMB, CKMBINDEX, TROPONINI in the last 168 hours. BNP (last 3 results) No results for input(s): PROBNP in the last 8760 hours. HbA1C: No results for input(s): HGBA1C in the last 72 hours. CBG: No results for input(s): GLUCAP in the last 168  hours. Lipid Profile: No results for input(s): CHOL, HDL, LDLCALC, TRIG, CHOLHDL, LDLDIRECT in the last 72 hours. Thyroid Function Tests: No results for input(s): TSH, T4TOTAL, FREET4, T3FREE, THYROIDAB in the last 72 hours. Anemia Panel: No results for input(s): VITAMINB12, FOLATE, FERRITIN, TIBC, IRON, RETICCTPCT in the last 72 hours. Urine analysis: No results found for: COLORURINE, APPEARANCEUR, [place], [place], GLUCOSEU, HGBUR, BILIRUBINUR, KETONESUR, PROTEINUR, UROBILINOGEN, NITRITE, LEUKOCYTESUR  Radiological Exams on  Admission: Dg Chest 2 View  Result Date: 03/01/2018 CLINICAL DATA:  Centralized chest pressure EXAM: CHEST - 2 VIEW COMPARISON:  Report 11/03/2011 FINDINGS: Atelectasis or scarring at the left base. No pleural effusion. Normal heart size. No pneumothorax. IMPRESSION: No active cardiopulmonary disease. Minimal atelectasis or scarring at the left base. Electronically Signed   By: Donavan Foil M.D.   On: 03/01/2018 19:19   Ct Abdomen Pelvis W Contrast  Result Date: 03/01/2018 CLINICAL DATA:  Abdominal pain, fever EXAM: CT ABDOMEN AND PELVIS WITH CONTRAST TECHNIQUE: Multidetector CT imaging of the abdomen and pelvis was performed using the standard protocol following bolus administration of intravenous contrast. CONTRAST:  181mL OMNIPAQUE IOHEXOL 300 MG/ML  SOLN COMPARISON:  None. FINDINGS: Lower chest: Lung bases are clear. Hepatobiliary: Liver is within normal limits. Layering small gallstones (series 3/image 30), with mild gallbladder wall thickening/pericholecystic fluid. No intrahepatic or extrahepatic ductal dilatation. Pancreas: Within normal limits. Spleen: Within normal limits. Adrenals/Urinary Tract: Adrenal glands are within normal limits. Right kidney is within normal limits. 3.2 cm posterior interpolar left renal cyst (series 3/image 33). No hydronephrosis. Bladder is within normal limits. Stomach/Bowel: Stomach is notable for a moderate hiatal hernia. No evidence of bowel obstruction. Appendix is not discretely visualized. Sigmoid diverticulosis, without evidence of diverticulitis. Vascular/Lymphatic: No evidence of abdominal aortic aneurysm. Atherosclerotic calcifications of the abdominal aorta and branch vessels. No suspicious abdominopelvic lymphadenopathy. Reproductive: Uterus is within normal limits. Bilateral ovaries are within normal limits. Other: No abdominopelvic ascites. Musculoskeletal: Mild degenerative changes of the visualized thoracolumbar spine. IMPRESSION: Cholelithiasis with mild  gallbladder wall thickening/pericholecystic fluid, raising concern for early acute cholecystitis. Consider hepatobiliary nuclear medicine scan for further evaluation, as clinically warranted. No evidence of bowel obstruction. Appendix not discretely visualized. Sigmoid diverticulosis, without evidence of diverticulitis. Electronically Signed   By: Julian Hy M.D.   On: 03/01/2018 20:32    EKG: Independently reviewed. Non-ischemic  Assessment/Plan Active Problems:   AKI (acute kidney injury) ([place])   Essential hypertension   Paroxysmal SVT (supraventricular tachycardia) ([facility])   Diabetes mellitus ([facility])   Acute cholecystitis   Trigeminal neuralgia   Generalized anxiety disorder   # acute cholesystitis - hemodynamically stable. Symptoms consistent. Labs sig for leukocytosis, possible aki vs ckd, mild transaminitis. CT suggestive. Gen surg has seen, advising u/s, if still suggestive of cholecystitis, surgery in AM. Is not complaining of chest pain, trop is negative after 24 hrs of pain, and ekg non-ischemic - do not think cardiac cause. - continue ceftriaxone - npo past midnight - appreciate further surgery recs  # Transaminitis - mild, likely 2/2 above - trend, further w/u if persists  # Acute kidney injury - though unclear if baseline ckd as recent cr not available.  - d5 1/2 ns @ 100/hr - trend  # HTN - hold home benazepril, imdur, metoprolol, and lasix give low normal BPs and active infection  # DM - here glucose upper 100s.  - f/u a1c - ssi - hold home glipizide and metformin  # trigeminal neuralgia - continue gabapentin  # anxiety -  continue alprazolam qhs  # paroxysmal svt - here nsr - re-start home metop when able  DVT prophylaxis: SCDs for now, holding on pharmacologic given likely surgery in AM Code Status: dnr, confirmed w/ pt  Family Communication: daughter 15 chicc[address]lla  Disposition Plan: tbd  Consults called: [facility] (gen surg)  Admission status:  med/surg    Desma Maxim MD [facility] Pager [phone]  If 7PM-7AM, please contact night-coverage [email or web address] Password [facility]  03/01/2018, 9:57 PM

## 2018-03-01 NOTE — ED Provider Notes (Signed)
[facility] EMERGENCY DEPARTMENT Provider Note   CSN: [id number] Arrival date & time: 03/01/18  1621     History   Chief Complaint Chief Complaint  Patient presents with  . Chest Pain    HPI Nancy Webster is a 84 y.o. female with a past medical history of CAD, hypertension, diabetes, who presents to ED for evaluation of epigastric abdominal pain radiating to her back.  Symptoms began Wednesday night/Thursday morning.  Today is Friday.  On Wednesday night, she ate half a sandwich and then felt like "it turned into cardboard, it was just stuck there in my stomach."  She reports intermittent nausea as well.  States that she did have some chest discomfort when the symptoms began but it is now resolved and now only located in her abdomen.  The nurse at the skilled nursing facility checked her temperature last night and it was 101.18F.  She was seen and evaluated by her PCP earlier this morning and was afebrile but was sent over for evaluation of her abdominal pain for possible diverticulitis versus cholecystitis.  She denies any dysuria, hematuria, shortness of breath, hemoptysis, leg swelling, constipation, diarrhea, vomiting.  Prior abdominal surgeries include appendectomy in the 1970s.  HPI  Past Medical History:  Diagnosis Date  . Anxiety   . Coronary artery disease   . Diabetes mellitus without complication ([place])   . GERD (gastroesophageal reflux disease)   . Hypertension   . Renal disorder    stage 3    There are no active problems to display for this patient.     OB History   None      Home Medications    Prior to Admission medications   Not on File    Family History No family history on file.  Social History Social History   Tobacco Use  . Smoking status: Not on file  Substance Use Topics  . Alcohol use: Not on file  . Drug use: Not on file     Allergies   Patient has no allergy information on record.   Review of Systems Review of  Systems  Constitutional: Negative for appetite change, chills and fever.  HENT: Negative for ear pain, rhinorrhea, sneezing and sore throat.   Eyes: Negative for photophobia and visual disturbance.  Respiratory: Negative for cough, chest tightness, shortness of breath and wheezing.   Cardiovascular: Positive for chest pain. Negative for palpitations.  Gastrointestinal: Positive for abdominal pain and nausea. Negative for blood in stool, constipation, diarrhea and vomiting.  Genitourinary: Positive for flank pain. Negative for dysuria, hematuria and urgency.  Musculoskeletal: Negative for myalgias.  Skin: Negative for rash.  Neurological: Negative for dizziness, weakness and light-headedness.     Physical Exam Updated Vital Signs BP (!) 117/55   Pulse 92   Temp 99.1 F (37.3 C) (Oral)   Resp (!) 29   Ht 5' 3.5" (1.613 m)   Wt 73 kg (161 lb)   SpO2 93%   BMI 28.07 kg/m   Physical Exam  Constitutional: She appears well-developed and well-nourished. No distress.  HENT:  Head: Normocephalic and atraumatic.  Nose: Nose normal.  Eyes: Conjunctivae and EOM are normal. Left eye exhibits no discharge. No scleral icterus.  Neck: Normal range of motion. Neck supple.  Cardiovascular: Normal rate, regular rhythm, normal heart sounds and intact distal pulses. Exam reveals no gallop and no friction rub.  No murmur heard. Pulmonary/Chest: Effort normal and breath sounds normal. No respiratory distress.  Abdominal:  Soft. Bowel sounds are normal. She exhibits no distension. There is tenderness in the right upper quadrant and epigastric area. There is CVA tenderness (R). There is no guarding.    Musculoskeletal: Normal range of motion. She exhibits no edema.  Neurological: She is alert. She exhibits normal muscle tone. Coordination normal.  Skin: Skin is warm and dry. No rash noted.  Psychiatric: She has a normal mood and affect.  Nursing note and vitals reviewed.    ED Treatments /  Results  Labs (all labs ordered are listed, but only abnormal results are displayed) Labs Reviewed  CBC WITH DIFFERENTIAL/PLATELET - Abnormal; Notable for the following components:      Result Value   WBC 19.1 (*)    RBC 3.73 (*)    Hemoglobin 11.6 (*)    HCT 35.4 (*)    Neutro Abs 15.0 (*)    All other components within normal limits  COMPREHENSIVE METABOLIC PANEL - Abnormal; Notable for the following components:   Chloride 98 (*)    Glucose, Bld 199 (*)    BUN 25 (*)    Creatinine, Ser 1.51 (*)    AST 57 (*)    ALT 65 (*)    GFR calc non Af Amer 30 (*)    GFR calc Af Amer 35 (*)    All other components within normal limits  LIPASE, BLOOD  I-STAT TROPONIN, ED    EKG EKG Interpretation  Date/Time:  Friday Mar 01 2018 16:36:13 EDT Ventricular Rate:  96 PR Interval:    QRS Duration: 72 QT Interval:  372 QTC Calculation: 469 R Axis:   74 Text Interpretation:  no evidence of ischemia noted.  Confirmed by Zenovia Jarred [phone]) on 03/01/2018 7:20:14 PM   Radiology Dg Chest 2 View  Result Date: 03/01/2018 CLINICAL DATA:  Centralized chest pressure EXAM: CHEST - 2 VIEW COMPARISON:  Report 11/03/2011 FINDINGS: Atelectasis or scarring at the left base. No pleural effusion. Normal heart size. No pneumothorax. IMPRESSION: No active cardiopulmonary disease. Minimal atelectasis or scarring at the left base. Electronically Signed   By: Donavan Foil M.D.   On: 03/01/2018 19:19   Ct Abdomen Pelvis W Contrast  Result Date: 03/01/2018 CLINICAL DATA:  Abdominal pain, fever EXAM: CT ABDOMEN AND PELVIS WITH CONTRAST TECHNIQUE: Multidetector CT imaging of the abdomen and pelvis was performed using the standard protocol following bolus administration of intravenous contrast. CONTRAST:  134mL OMNIPAQUE IOHEXOL 300 MG/ML  SOLN COMPARISON:  None. FINDINGS: Lower chest: Lung bases are clear. Hepatobiliary: Liver is within normal limits. Layering small gallstones (series 3/image 30), with mild  gallbladder wall thickening/pericholecystic fluid. No intrahepatic or extrahepatic ductal dilatation. Pancreas: Within normal limits. Spleen: Within normal limits. Adrenals/Urinary Tract: Adrenal glands are within normal limits. Right kidney is within normal limits. 3.2 cm posterior interpolar left renal cyst (series 3/image 33). No hydronephrosis. Bladder is within normal limits. Stomach/Bowel: Stomach is notable for a moderate hiatal hernia. No evidence of bowel obstruction. Appendix is not discretely visualized. Sigmoid diverticulosis, without evidence of diverticulitis. Vascular/Lymphatic: No evidence of abdominal aortic aneurysm. Atherosclerotic calcifications of the abdominal aorta and branch vessels. No suspicious abdominopelvic lymphadenopathy. Reproductive: Uterus is within normal limits. Bilateral ovaries are within normal limits. Other: No abdominopelvic ascites. Musculoskeletal: Mild degenerative changes of the visualized thoracolumbar spine. IMPRESSION: Cholelithiasis with mild gallbladder wall thickening/pericholecystic fluid, raising concern for early acute cholecystitis. Consider hepatobiliary nuclear medicine scan for further evaluation, as clinically warranted. No evidence of bowel obstruction. Appendix not discretely visualized. Sigmoid  diverticulosis, without evidence of diverticulitis. Electronically Signed   By: Julian Hy M.D.   On: 03/01/2018 20:32    Procedures Procedures (including critical care time)  Medications Ordered in ED Medications  cefTRIAXone (ROCEPHIN) 1 g in sodium chloride 0.9 % 100 mL IVPB (has no administration in time range)  iohexol (OMNIPAQUE) 300 MG/ML solution 100 mL (100 mLs Intravenous Contrast Given 03/01/18 2004)     Initial Impression / Assessment and Plan / ED Course  I have reviewed the triage vital signs and the nursing notes.  Pertinent labs & imaging results that were available during my care of the patient were reviewed by me and  considered in my medical decision making (see chart for details).     Patient presents to ED for evaluation of 2-day history of epigastric abdominal pain radiating to her back.  She states that the symptoms began after she ate a sandwich.  She reported some chest discomfort when the symptoms began but it has now resolved and is now only located in her abdomen.  She reports intermittent nausea as well.  She was febrile to 101.3 yesterday.  She denies any hematuria, dysuria, shortness of breath, hemoptysis, history of DVT, PE or MI.  She does have epigastric and right upper quadrant tenderness to palpation without rebound or guarding.  She is afebrile here.  Other vital signs are within normal limits.  Lab work significant for mild elevation in AST and ALT, leukocytosis at 19.  Troponin is negative.  EKG with no ischemic changes.  Chest x-ray unremarkable.  CT of the abdomen and pelvis showing early acute cholecystitis.  Spoke to Dr. Donne Hazel of surgery who recommends hospital admission, antibiotics and consult.  Will admit to hospitalist for further management.  Portions of this note were generated with [employer]. Dictation errors may occur despite best attempts at proofreading.   Final Clinical Impressions(s) / ED Diagnoses   Final diagnoses:  None    ED Discharge Orders    None       Delia Heady, PA-C 03/01/18 2103    Macarthur Critchley, MD 03/01/18 2148

## 2018-03-01 NOTE — ED Provider Notes (Signed)
Patient placed in Quick Look pathway, seen and evaluated   Chief Complaint: chest pain and abdominal pain  HPI:   Nancy Webster is a 84 y.o. female who presents to the ED with centralized chest pain that started yesterday and has gotten worse. Patient reports that the pain radiates to her back and abdomen and that her legs feel weak. Patient denies UTI symptoms. Patient was evaluated by the RN where she lives and it was reported that she had a fever. Patient went to her PCP but did not have a fever there.   ROS: General: ? fever, chills   GI: abdominal pain  CV: chest pain    Physical Exam:  BP (!) 124/50 (BP Location: Right Arm)   Pulse 96   Temp 99.1 F (37.3 C) (Oral)   Resp 18   Ht 5' 3.5" (1.613 m)   Wt 73 kg (161 lb)   SpO2 95%   BMI 28.07 kg/m    Gen: No distress  Neuro: Awake and Alert  Skin: Warm and dry  Abdomen: soft, generalized tenderness with increased pain in the epigastric area. Right CVA tenderness.        Initiation of care has begun. The patient has been counseled on the process, plan, and necessity for staying for the completion/evaluation, and the remainder of the medical screening examination    Ashley Murrain, NP 03/01/18 1708    Carmin Muskrat, MD 03/02/18 [phone]

## 2018-03-01 NOTE — ED Triage Notes (Signed)
Pt states centralized chest pressure starting yesterday morning radiating into the stomach and bilateral sides, sore to touch, also having lower back pain with leg weakness.

## 2018-03-02 ENCOUNTER — Encounter (HOSPITAL_COMMUNITY)
Admission: EM | Disposition: A | Discharge status: Home / Self Care | Payer: Self-pay | Source: Home / Self Care | Attending: Family Medicine

## 2018-03-02 ENCOUNTER — Other Ambulatory Visit: Payer: Self-pay

## 2018-03-02 ENCOUNTER — Inpatient Hospital Stay (HOSPITAL_COMMUNITY): Payer: Medicare Other | Admitting: Certified Registered"

## 2018-03-02 ENCOUNTER — Encounter (HOSPITAL_COMMUNITY): Payer: Self-pay

## 2018-03-02 DIAGNOSIS — K810 Acute cholecystitis: Secondary | ICD-10-CM

## 2018-03-02 HISTORY — PX: CHOLECYSTECTOMY: SHX55

## 2018-03-02 LAB — CBC
HEMATOCRIT: 34.5 % — AB (ref 36.0–46.0)
HEMOGLOBIN: 11.2 g/dL — AB (ref 12.0–15.0)
MCH: 30.9 pg (ref 26.0–34.0)
MCHC: 32.5 g/dL (ref 30.0–36.0)
MCV: 95 fL (ref 78.0–100.0)
Platelets: 262 10*3/uL (ref 150–400)
RBC: 3.63 MIL/uL — AB (ref 3.87–5.11)
RDW: 13.3 % (ref 11.5–15.5)
WBC: 16.3 10*3/uL — AB (ref 4.0–10.5)

## 2018-03-02 LAB — GLUCOSE, CAPILLARY
GLUCOSE-CAPILLARY: 223 mg/dL — AB (ref 65–99)
GLUCOSE-CAPILLARY: 192 mg/dL — AB (ref 65–99)
Glucose-Capillary: 221 mg/dL — ABNORMAL HIGH (ref 65–99)
Glucose-Capillary: 147 mg/dL — ABNORMAL HIGH (ref 65–99)
Glucose-Capillary: 156 mg/dL — ABNORMAL HIGH (ref 65–99)
Glucose-Capillary: 264 mg/dL — ABNORMAL HIGH (ref 65–99)

## 2018-03-02 LAB — COMPREHENSIVE METABOLIC PANEL
ALT: 51 U/L (ref 14–54)
AST: 39 U/L (ref 15–41)
Albumin: 3.2 g/dL — ABNORMAL LOW (ref 3.5–5.0)
Alkaline Phosphatase: 58 U/L (ref 38–126)
Anion gap: 10 (ref 5–15)
BUN: 23 mg/dL — ABNORMAL HIGH (ref 6–20)
CHLORIDE: 97 mmol/L — AB (ref 101–111)
CO2: 29 mmol/L (ref 22–32)
Calcium: 9 mg/dL (ref 8.9–10.3)
Creatinine, Ser: 1.26 mg/dL — ABNORMAL HIGH (ref 0.44–1.00)
GFR calc Af Amer: 43 mL/min — ABNORMAL LOW (ref >60)
GFR, EST NON AFRICAN AMERICAN: 37 mL/min — AB (ref >60)
Glucose, Bld: 167 mg/dL — ABNORMAL HIGH (ref 65–99)
POTASSIUM: 4.4 mmol/L (ref 3.5–5.1)
SODIUM: 136 mmol/L (ref 135–145)
Total Bilirubin: 0.8 mg/dL (ref 0.3–1.2)
Total Protein: 6.4 g/dL — ABNORMAL LOW (ref 6.5–8.1)

## 2018-03-02 LAB — TYPE AND SCREEN
ABO/RH(D): O POS
Antibody Screen: NEGATIVE

## 2018-03-02 LAB — HEMOGLOBIN A1C
Hgb A1c MFr Bld: 7.8 % — ABNORMAL HIGH (ref 4.8–5.6)
Mean Plasma Glucose: 177.16 mg/dL

## 2018-03-02 LAB — ABO/RH: ABO/RH(D): O POS

## 2018-03-02 LAB — PROTIME-INR
INR: 1.25
PROTHROMBIN TIME: 15.6 s — AB (ref 11.4–15.2)

## 2018-03-02 SURGERY — LAPAROSCOPIC CHOLECYSTECTOMY
Anesthesia: General | Site: Abdomen

## 2018-03-02 MED ORDER — HYDROCORTISONE 1 % EX CREA: 1.0000 "application " | TOPICAL_CREAM | Freq: Three times a day (TID) | CUTANEOUS | Status: DC | PRN

## 2018-03-02 MED ORDER — HEMOSTATIC AGENTS (NO CHARGE) OPTIME
TOPICAL | Status: DC | PRN
  Administered 2018-03-02: 1 via TOPICAL

## 2018-03-02 MED ORDER — FENTANYL CITRATE (PF) 100 MCG/2ML IJ SOLN
INTRAMUSCULAR | Status: AC
  Filled 2018-03-02: qty 2

## 2018-03-02 MED ORDER — GLYCOPYRROLATE 0.2 MG/ML IJ SOLN
INTRAMUSCULAR | Status: DC | PRN
  Administered 2018-03-02: .4 mg via INTRAVENOUS

## 2018-03-02 MED ORDER — BUPIVACAINE-EPINEPHRINE 0.25% -1:200000 IJ SOLN
INTRAMUSCULAR | Status: DC | PRN
  Administered 2018-03-02: 15 mL

## 2018-03-02 MED ORDER — SODIUM CHLORIDE 0.9 % IR SOLN
Status: DC | PRN
  Administered 2018-03-02: 1000 mL

## 2018-03-02 MED ORDER — CISATRACURIUM BESYLATE 20 MG/10ML IV SOLN
INTRAVENOUS | Status: AC
  Filled 2018-03-02: qty 10

## 2018-03-02 MED ORDER — PHENYLEPHRINE 40 MCG/ML (10ML) SYRINGE FOR IV PUSH (FOR BLOOD PRESSURE SUPPORT)
PREFILLED_SYRINGE | INTRAVENOUS | Status: AC
  Filled 2018-03-02: qty 10

## 2018-03-02 MED ORDER — ASPIRIN-ACETAMINOPHEN-CAFFEINE 250-250-65 MG PO TABS
1.0000 | ORAL_TABLET | Freq: Three times a day (TID) | ORAL | Status: DC | PRN
  Filled 2018-03-02: qty 1

## 2018-03-02 MED ORDER — METOCLOPRAMIDE HCL 5 MG/ML IJ SOLN: 10.0000 mg | Freq: Four times a day (QID) | INTRAMUSCULAR | Status: DC | PRN

## 2018-03-02 MED ORDER — FENTANYL CITRATE (PF) 250 MCG/5ML IJ SOLN
INTRAMUSCULAR | Status: AC
  Filled 2018-03-02: qty 5

## 2018-03-02 MED ORDER — LIP MEDEX EX OINT
TOPICAL_OINTMENT | Freq: Two times a day (BID) | CUTANEOUS | Status: DC
Start: 2018-03-02 — End: 2018-03-04
  Administered 2018-03-02: 1 via TOPICAL
  Administered 2018-03-02 – 2018-03-04 (×3): via TOPICAL
  Filled 2018-03-02: qty 7

## 2018-03-02 MED ORDER — INSULIN ASPART 100 UNIT/ML ~~LOC~~ SOLN
SUBCUTANEOUS | Status: AC
  Administered 2018-03-02: 5 [IU] via SUBCUTANEOUS
  Filled 2018-03-02: qty 1

## 2018-03-02 MED ORDER — ARTIFICIAL TEARS OPHTHALMIC OINT
TOPICAL_OINTMENT | OPHTHALMIC | Status: AC
  Filled 2018-03-02: qty 3.5

## 2018-03-02 MED ORDER — PROPOFOL 10 MG/ML IV BOLUS
INTRAVENOUS | Status: AC
  Filled 2018-03-02: qty 20

## 2018-03-02 MED ORDER — PHENYLEPHRINE HCL 10 MG/ML IJ SOLN
INTRAMUSCULAR | Status: DC | PRN
  Administered 2018-03-02: 80 ug via INTRAVENOUS

## 2018-03-02 MED ORDER — HYDROCORTISONE 2.5 % RE CREA
1.0000 | TOPICAL_CREAM | Freq: Four times a day (QID) | RECTAL | Status: DC | PRN
Start: 2018-03-02 — End: 2018-03-04
  Filled 2018-03-02: qty 28.35

## 2018-03-02 MED ORDER — CISATRACURIUM BESYLATE (PF) 10 MG/5ML IV SOLN
INTRAVENOUS | Status: DC | PRN
  Administered 2018-03-02: 12 mg via INTRAVENOUS

## 2018-03-02 MED ORDER — MORPHINE SULFATE (PF) 2 MG/ML IV SOLN: 1.0000 mg | INTRAVENOUS | Status: DC | PRN

## 2018-03-02 MED ORDER — LIDOCAINE HCL (CARDIAC) PF 100 MG/5ML IV SOSY
PREFILLED_SYRINGE | INTRAVENOUS | Status: DC | PRN
  Administered 2018-03-02: 60 mg via INTRAVENOUS

## 2018-03-02 MED ORDER — ENALAPRILAT 1.25 MG/ML IV SOLN
0.6250 mg | Freq: Four times a day (QID) | INTRAVENOUS | Status: DC | PRN
  Filled 2018-03-02: qty 1

## 2018-03-02 MED ORDER — SODIUM CHLORIDE 0.9 % IV SOLN
INTRAVENOUS | Status: DC | PRN
  Administered 2018-03-02: .01 mL

## 2018-03-02 MED ORDER — METOPROLOL TARTRATE 5 MG/5ML IV SOLN
INTRAVENOUS | Status: DC | PRN
  Administered 2018-03-02: 2 mg via INTRAVENOUS

## 2018-03-02 MED ORDER — MENTHOL 3 MG MT LOZG: 1.0000 | LOZENGE | OROMUCOSAL | Status: DC | PRN

## 2018-03-02 MED ORDER — METOPROLOL TARTRATE 5 MG/5ML IV SOLN: 5.0000 mg | Freq: Four times a day (QID) | INTRAVENOUS | Status: DC | PRN

## 2018-03-02 MED ORDER — EPHEDRINE 5 MG/ML INJ
INTRAVENOUS | Status: AC
  Filled 2018-03-02: qty 10

## 2018-03-02 MED ORDER — LIDOCAINE 2% (20 MG/ML) 5 ML SYRINGE
INTRAMUSCULAR | Status: AC
  Filled 2018-03-02: qty 10

## 2018-03-02 MED ORDER — PROCHLORPERAZINE EDISYLATE 10 MG/2ML IJ SOLN: 5.0000 mg | INTRAMUSCULAR | Status: DC | PRN

## 2018-03-02 MED ORDER — ALUM & MAG HYDROXIDE-SIMETH 200-200-20 MG/5ML PO SUSP: 30.0000 mL | Freq: Four times a day (QID) | ORAL | Status: DC | PRN

## 2018-03-02 MED ORDER — GABAPENTIN 300 MG PO CAPS: 300.0000 mg | ORAL_CAPSULE | ORAL | Status: DC

## 2018-03-02 MED ORDER — INSULIN ASPART 100 UNIT/ML ~~LOC~~ SOLN
5.0000 [IU] | Freq: Once | SUBCUTANEOUS | Status: AC
  Administered 2018-03-02: 5 [IU] via SUBCUTANEOUS

## 2018-03-02 MED ORDER — PHENOL 1.4 % MT LIQD: 1.0000 | OROMUCOSAL | Status: DC | PRN

## 2018-03-02 MED ORDER — SODIUM CHLORIDE 0.9 % IV SOLN
INTRAVENOUS | Status: DC
  Administered 2018-03-02: 11:00:00 via INTRAVENOUS

## 2018-03-02 MED ORDER — ENOXAPARIN SODIUM 40 MG/0.4ML ~~LOC~~ SOLN
40.0000 mg | SUBCUTANEOUS | Status: DC
Start: 2018-03-03 — End: 2018-03-04
  Administered 2018-03-03 – 2018-03-04 (×2): 40 mg via SUBCUTANEOUS
  Filled 2018-03-02 (×2): qty 0.4

## 2018-03-02 MED ORDER — ACETAMINOPHEN 500 MG PO TABS
500.0000 mg | ORAL_TABLET | Freq: Four times a day (QID) | ORAL | Status: DC
  Administered 2018-03-02 – 2018-03-04 (×6): 500 mg via ORAL
  Filled 2018-03-02 (×6): qty 1

## 2018-03-02 MED ORDER — NEOSTIGMINE METHYLSULFATE 10 MG/10ML IV SOLN
INTRAVENOUS | Status: DC | PRN
  Administered 2018-03-02: 3 mg via INTRAVENOUS

## 2018-03-02 MED ORDER — ONDANSETRON HCL 4 MG/2ML IJ SOLN
INTRAMUSCULAR | Status: DC | PRN
  Administered 2018-03-02: 4 mg via INTRAVENOUS

## 2018-03-02 MED ORDER — BUPIVACAINE-EPINEPHRINE (PF) 0.25% -1:200000 IJ SOLN
INTRAMUSCULAR | Status: AC
  Filled 2018-03-02: qty 30

## 2018-03-02 MED ORDER — LACTATED RINGERS IV BOLUS: 1000.0000 mL | Freq: Three times a day (TID) | INTRAVENOUS | Status: DC | PRN

## 2018-03-02 MED ORDER — CHLORHEXIDINE GLUCONATE CLOTH 2 % EX PADS: 6.0000 | MEDICATED_PAD | Freq: Once | CUTANEOUS | Status: DC

## 2018-03-02 MED ORDER — ACETAMINOPHEN 500 MG PO TABS: 1000.0000 mg | ORAL_TABLET | ORAL | Status: DC

## 2018-03-02 MED ORDER — METHOCARBAMOL 1000 MG/10ML IJ SOLN
1000.0000 mg | Freq: Four times a day (QID) | INTRAVENOUS | Status: DC | PRN
  Filled 2018-03-02: qty 10

## 2018-03-02 MED ORDER — EPHEDRINE SULFATE 50 MG/ML IJ SOLN
INTRAMUSCULAR | Status: DC | PRN
  Administered 2018-03-02: 5 mg via INTRAVENOUS

## 2018-03-02 MED ORDER — ONDANSETRON HCL 4 MG/2ML IJ SOLN
INTRAMUSCULAR | Status: AC
  Filled 2018-03-02: qty 2

## 2018-03-02 MED ORDER — GUAIFENESIN-DM 100-10 MG/5ML PO SYRP: 10.0000 mL | ORAL_SOLUTION | ORAL | Status: DC | PRN

## 2018-03-02 MED ORDER — OXYCODONE HCL 5 MG PO TABS: 5.0000 mg | ORAL_TABLET | Freq: Once | ORAL | Status: DC | PRN

## 2018-03-02 MED ORDER — ROCURONIUM BROMIDE 50 MG/5ML IV SOLN
INTRAVENOUS | Status: AC
  Filled 2018-03-02: qty 1

## 2018-03-02 MED ORDER — ONDANSETRON HCL 4 MG/2ML IJ SOLN: 4.0000 mg | Freq: Once | INTRAMUSCULAR | Status: DC | PRN

## 2018-03-02 MED ORDER — ESMOLOL HCL 100 MG/10ML IV SOLN
INTRAVENOUS | Status: AC
  Filled 2018-03-02: qty 10

## 2018-03-02 MED ORDER — METHOCARBAMOL 1000 MG/10ML IJ SOLN
1000.0000 mg | Freq: Three times a day (TID) | INTRAMUSCULAR | Status: DC | PRN
  Filled 2018-03-02: qty 10

## 2018-03-02 MED ORDER — ASPIRIN EC 81 MG PO TBEC: 81.0000 mg | DELAYED_RELEASE_TABLET | Freq: Every day | ORAL | Status: DC

## 2018-03-02 MED ORDER — MAGIC MOUTHWASH
15.0000 mL | Freq: Four times a day (QID) | ORAL | Status: DC | PRN
  Administered 2018-03-02: 15 mL via ORAL
  Administered 2018-03-02: 5 mL via ORAL
  Administered 2018-03-03 (×3): 15 mL via ORAL
  Filled 2018-03-02 (×5): qty 15

## 2018-03-02 MED ORDER — BISACODYL 10 MG RE SUPP: 10.0000 mg | Freq: Two times a day (BID) | RECTAL | Status: DC | PRN

## 2018-03-02 MED ORDER — METOPROLOL TARTRATE 5 MG/5ML IV SOLN
INTRAVENOUS | Status: AC
  Filled 2018-03-02: qty 5

## 2018-03-02 MED ORDER — OXYCODONE HCL 5 MG PO TABS
2.5000 mg | ORAL_TABLET | ORAL | Status: DC | PRN
  Administered 2018-03-02 – 2018-03-04 (×3): 5 mg via ORAL
  Filled 2018-03-02 (×3): qty 1

## 2018-03-02 MED ORDER — FENTANYL CITRATE (PF) 100 MCG/2ML IJ SOLN
25.0000 ug | INTRAMUSCULAR | Status: DC | PRN
  Administered 2018-03-02 (×3): 25 ug via INTRAVENOUS

## 2018-03-02 MED ORDER — IOPAMIDOL (ISOVUE-300) INJECTION 61%
INTRAVENOUS | Status: AC
  Filled 2018-03-02: qty 50

## 2018-03-02 MED ORDER — DEXAMETHASONE SODIUM PHOSPHATE 10 MG/ML IJ SOLN
INTRAMUSCULAR | Status: AC
  Filled 2018-03-02: qty 1

## 2018-03-02 MED ORDER — ISOSORBIDE MONONITRATE ER 30 MG PO TB24
30.0000 mg | ORAL_TABLET | Freq: Every day | ORAL | Status: DC
  Administered 2018-03-03 – 2018-03-04 (×2): 30 mg via ORAL
  Filled 2018-03-02 (×2): qty 1

## 2018-03-02 MED ORDER — CHLORHEXIDINE GLUCONATE CLOTH 2 % EX PADS
6.0000 | MEDICATED_PAD | Freq: Once | CUTANEOUS | Status: AC
  Administered 2018-03-02: 6 via TOPICAL

## 2018-03-02 MED ORDER — FENTANYL CITRATE (PF) 100 MCG/2ML IJ SOLN
INTRAMUSCULAR | Status: DC | PRN
  Administered 2018-03-02: 50 ug via INTRAVENOUS
  Administered 2018-03-02 (×2): 25 ug via INTRAVENOUS

## 2018-03-02 MED ORDER — 0.9 % SODIUM CHLORIDE (POUR BTL) OPTIME
TOPICAL | Status: DC | PRN
  Administered 2018-03-02: 1000 mL

## 2018-03-02 MED ORDER — PROPOFOL 10 MG/ML IV BOLUS
INTRAVENOUS | Status: DC | PRN
  Administered 2018-03-02: 110 mg via INTRAVENOUS

## 2018-03-02 MED ORDER — BLISTEX MEDICATED EX OINT
1.0000 "application " | TOPICAL_OINTMENT | Freq: Two times a day (BID) | CUTANEOUS | Status: DC
  Filled 2018-03-02: qty 6.3

## 2018-03-02 MED ORDER — NEOSTIGMINE METHYLSULFATE 5 MG/5ML IV SOSY
PREFILLED_SYRINGE | INTRAVENOUS | Status: AC
  Filled 2018-03-02: qty 5

## 2018-03-02 MED ORDER — LIDOCAINE 2% (20 MG/ML) 5 ML SYRINGE
INTRAMUSCULAR | Status: AC
  Filled 2018-03-02: qty 5

## 2018-03-02 MED ORDER — OXYCODONE HCL 5 MG/5ML PO SOLN: 5.0000 mg | Freq: Once | ORAL | Status: DC | PRN

## 2018-03-02 SURGICAL SUPPLY — 45 items
APPLIER CLIP 5 13 M/L LIGAMAX5 (MISCELLANEOUS) ×4
BANDAGE ADH SHEER 1  50/CT (GAUZE/BANDAGES/DRESSINGS) ×3 IMPLANT
BENZOIN TINCTURE PRP APPL 2/3 (GAUZE/BANDAGES/DRESSINGS) ×4 IMPLANT
CANISTER SUCT 3000ML PPV (MISCELLANEOUS) ×4 IMPLANT
CHLORAPREP W/TINT 26ML (MISCELLANEOUS) ×4 IMPLANT
CLIP APPLIE 5 13 M/L LIGAMAX5 (MISCELLANEOUS) ×2 IMPLANT
CLOSURE WOUND 1/2 X4 (GAUZE/BANDAGES/DRESSINGS) ×1
COVER MAYO STAND STRL (DRAPES) ×1 IMPLANT
COVER SURGICAL LIGHT HANDLE (MISCELLANEOUS) ×4 IMPLANT
DRAPE C-ARM 42X72 X-RAY (DRAPES) ×1 IMPLANT
DRSG TEGADERM 4X4.75 (GAUZE/BANDAGES/DRESSINGS) ×4 IMPLANT
ELECT REM PT RETURN 9FT ADLT (ELECTROSURGICAL) ×4
ELECTRODE REM PT RTRN 9FT ADLT (ELECTROSURGICAL) ×2 IMPLANT
GAUZE SPONGE 2X2 8PLY STRL LF (GAUZE/BANDAGES/DRESSINGS) ×2 IMPLANT
GLOVE BIOGEL M STRL SZ7.5 (GLOVE) ×4 IMPLANT
GLOVE BIOGEL PI IND STRL 8 (GLOVE) ×3 IMPLANT
GLOVE BIOGEL PI INDICATOR 8 (GLOVE) ×2
GOWN STRL REUS W/ TWL LRG LVL3 (GOWN DISPOSABLE) ×4 IMPLANT
GOWN STRL REUS W/ TWL XL LVL3 (GOWN DISPOSABLE) ×2 IMPLANT
GOWN STRL REUS W/TWL 2XL LVL3 (GOWN DISPOSABLE) ×4 IMPLANT
GOWN STRL REUS W/TWL LRG LVL3 (GOWN DISPOSABLE) ×2
GOWN STRL REUS W/TWL XL LVL3 (GOWN DISPOSABLE) ×4
GRASPER SUT TROCAR 14GX15 (MISCELLANEOUS) IMPLANT
HEMOSTAT SNOW SURGICEL 2X4 (HEMOSTASIS) ×3 IMPLANT
KIT BASIN OR (CUSTOM PROCEDURE TRAY) ×4 IMPLANT
KIT TURNOVER KIT B (KITS) ×4 IMPLANT
NS IRRIG 1000ML POUR BTL (IV SOLUTION) ×4 IMPLANT
PAD ARMBOARD 7.5X6 YLW CONV (MISCELLANEOUS) ×4 IMPLANT
POUCH RETRIEVAL ECOSAC 10 (ENDOMECHANICALS) ×2 IMPLANT
POUCH RETRIEVAL ECOSAC 10MM (ENDOMECHANICALS) ×2
SCISSORS LAP 5X35 DISP (ENDOMECHANICALS) ×4 IMPLANT
SET CHOLANGIOGRAPH 5 50 .035 (SET/KITS/TRAYS/PACK) ×1 IMPLANT
SET IRRIG TUBING LAPAROSCOPIC (IRRIGATION / IRRIGATOR) ×4 IMPLANT
SLEEVE ENDOPATH XCEL 5M (ENDOMECHANICALS) ×11 IMPLANT
SPECIMEN JAR SMALL (MISCELLANEOUS) ×4 IMPLANT
SPONGE GAUZE 2X2 STER 10/PKG (GAUZE/BANDAGES/DRESSINGS) ×2
STRIP CLOSURE SKIN 1/2X4 (GAUZE/BANDAGES/DRESSINGS) ×3 IMPLANT
SUT MNCRL AB 4-0 PS2 18 (SUTURE) ×7 IMPLANT
SUT VICRYL 0 UR6 27IN ABS (SUTURE) IMPLANT
TOWEL OR 17X24 6PK STRL BLUE (TOWEL DISPOSABLE) ×4 IMPLANT
TRAY LAPAROSCOPIC MC (CUSTOM PROCEDURE TRAY) ×4 IMPLANT
TROCAR XCEL BLUNT TIP 100MML (ENDOMECHANICALS) ×4 IMPLANT
TROCAR XCEL NON-BLD 5MMX100MML (ENDOMECHANICALS) ×4 IMPLANT
TUBING INSUFFLATION (TUBING) ×4 IMPLANT
WATER STERILE IRR 1000ML POUR (IV SOLUTION) ×4 IMPLANT

## 2018-03-02 NOTE — Anesthesia Preprocedure Evaluation (Signed)
Anesthesia Evaluation  Patient identified by MRN, date of birth, ID band Patient awake    Reviewed: Allergy & Precautions, NPO status , Patient's Chart, lab work & pertinent test results  Airway Mallampati: II  TM Distance: >3 FB Neck ROM: Full    Dental  (+) Dental Advisory Given, Teeth Intact   Pulmonary    breath sounds clear to auscultation       Cardiovascular hypertension,  Rhythm:Regular Rate:Normal     Neuro/Psych    GI/Hepatic   Endo/Other  diabetes  Renal/GU      Musculoskeletal   Abdominal   Peds  Hematology   Anesthesia Other Findings   Reproductive/Obstetrics                             Anesthesia Physical Anesthesia Plan  ASA: III  Anesthesia Plan: General   Post-op Pain Management:    Induction: Intravenous  PONV Risk Score and Plan: Ondansetron  Airway Management Planned: Oral ETT  Additional Equipment:   Intra-op Plan:   Post-operative Plan: Extubation in OR  Informed Consent: I have reviewed the patients History and Physical, chart, labs and discussed the procedure including the risks, benefits and alternatives for the proposed anesthesia with the patient or authorized representative who has indicated his/her understanding and acceptance.   Dental advisory given  Plan Discussed with: CRNA and Anesthesiologist  Anesthesia Plan Comments:         Anesthesia Quick Evaluation

## 2018-03-02 NOTE — Progress Notes (Signed)
Looked for family in waiting area but was unable to locate anyone.  Called and let 5W RN know.

## 2018-03-02 NOTE — Progress Notes (Addendum)
Report called to Short stay. Informed nurse that orders had just been placed for surgery so nothing preop has been done. Per short stay nurse. Check blood sugar and give first CHG bath and he can handle the rest in short stay.

## 2018-03-02 NOTE — Progress Notes (Signed)
[place]  [address] [place], [place] [address] Phone: [phone]  FAX: [phone]      Nancy Webster [id number] Sep 27, 1928  CARE TEAM:  PCP: Lajean Manes, MD  Outpatient Care Team: Patient Care Team: Lajean Manes, MD as PCP - General (Internal Medicine)  Inpatient Treatment Team: Treatment Team: Attending Provider: Roxan Hockey, MD; Consulting Physician: Edison Pace, Md, MD; Rounding Team: Sharyon Medicus, MD; Registered Nurse: Jackson, Niger N, RN   Problem List:   Principal Problem:   Acute cholecystitis Active Problems:   AKI (acute kidney injury) [facility])   Essential hypertension   Paroxysmal SVT (supraventricular tachycardia) ([place])   Diabetes mellitus ([place])   Trigeminal neuralgia   Generalized anxiety disorder      * No surgery found *     Assessment  Acute cholecystitis  Plan:  Lap chole today vs AM:  The anatomy & physiology of hepatobiliary & pancreatic function was discussed.  The pathophysiology of gallbladder dysfunction was discussed.  Natural history risks without surgery was discussed.   I feel the risks of no intervention will lead to serious problems that outweigh the operative risks; therefore, I recommended cholecystectomy to remove the pathology.  I explained laparoscopic techniques with possible need for an open approach.  Probable cholangiogram to evaluate the bilary tract was explained as well.    Risks such as bleeding, infection, abscess, leak, injury to other organs, need for repair of tissues / organs, need for further treatment, stroke, heart attack, death, and other risks were discussed.  I noted a good likelihood this will help address the problem.  Possibility that this will not correct all abdominal symptoms was explained.  Goals of post-operative recovery were discussed as well.  We will work to minimize complications.  An educational handout further explaining the pathology and  treatment options was given as well.  Questions were answered.  The patient expresses understanding & wishes to proceed with surgery.  -IV Abx - ceftriaxone -HTN - PRN for for.  Hold lasix for now -HA - Excedrin OK -VTE prophylaxis- SCDs, etc -mobilize as tolerated to help recovery  25 minutes spent in review, evaluation, examination, counseling, and coordination of care.  More than 50% of that time was spent in counseling.  Adin Hector, M.D., F.A.C.S. Gastrointestinal and Minimally Invasive Surgery [facility] [address] [place], [place] [address] [phone] Main / Paging   03/02/2018    Subjective: (Chief complaint)  Less sore since admission last night C/o HA Worried about missing home meds Wants to get surgery ASAP & "not wait forever"  Objective:  Vital signs:  Vitals:   03/01/18 2330 03/01/18 2341 03/02/18 0006 03/02/18 0536  BP: (!) 119/56  (!) 128/53 (!) 110/44  Pulse: 88  78 81  Resp: (!) 25  18 18   Temp:  98.7 F (37.1 C) 99.2 F (37.3 C)   TempSrc:  Oral    SpO2: 92%  95% 93%  Weight:      Height:        Last BM Date: 02/28/18  Intake/Output   Yesterday:  05/03 0701 - 05/04 0700 In: 740 [I.V.:540; IV Piggyback:200] Out: -  This shift:  No intake/output data recorded.  Bowel function:  Flatus: YES  BM:  No  Drain: (No drain)   Physical Exam:  General: Pt awake/alert/oriented x4 in no acute distress Eyes: PERRL, normal EOM.  Sclera clear.  No icterus Neuro: CN II-XII intact  w/o focal sensory/motor deficits. Lymph: No head/neck/groin lymphadenopathy Psych:  No delerium/psychosis/paranoia HENT: Normocephalic, Mucus membranes moist.  No thrush Neck: Supple, No tracheal deviation Chest: No chest wall pain w good excursion CV:  Pulses intact.  Regular rhythm MS: Normal AROM mjr joints.  No obvious deformity  Abdomen: Soft.  Nondistended.  Tenderness at RUQ/epigastric.  No evidence of  peritonitis.  No incarcerated hernias.  Ext:  No deformity.  No mjr edema.  No cyanosis Skin: No petechiae / purpura  Results:   Labs: Results for orders placed or performed during the hospital encounter of 03/01/18 (from the past 48 hour(s))  CBC with Differential     Status: Abnormal   Collection Time: 03/01/18  4:56 PM  Result Value Ref Range   WBC 19.1 (H) 4.0 - 10.5 K/uL   RBC 3.73 (L) 3.87 - 5.11 MIL/uL   Hemoglobin 11.6 (L) 12.0 - 15.0 g/dL   HCT 35.4 (L) 36.0 - 46.0 %   MCV 94.9 78.0 - 100.0 fL   MCH 31.1 26.0 - 34.0 pg   MCHC 32.8 30.0 - 36.0 g/dL   RDW 13.2 11.5 - 15.5 %   Platelets 284 150 - 400 K/uL   Neutrophils Relative % 79 %   Neutro Abs 15.0 (H) 1.7 - 7.7 K/uL   Lymphocytes Relative 16 %   Lymphs Abs 3.1 0.7 - 4.0 K/uL   Monocytes Relative 5 %   Monocytes Absolute 0.9 0.1 - 1.0 K/uL   Eosinophils Relative 0 %   Eosinophils Absolute 0.0 0.0 - 0.7 K/uL   Basophils Relative 0 %   Basophils Absolute 0.0 0.0 - 0.1 K/uL    Comment: Performed at [facility], [address], [place], [place] [address]  Comprehensive metabolic panel     Status: Abnormal   Collection Time: 03/01/18  4:56 PM  Result Value Ref Range   Sodium 137 135 - 145 mmol/L   Potassium 3.7 3.5 - 5.1 mmol/L   Chloride 98 (L) 101 - 111 mmol/L   CO2 28 22 - 32 mmol/L   Glucose, Bld 199 (H) 65 - 99 mg/dL   BUN 25 (H) 6 - 20 mg/dL   Creatinine, Ser 1.51 (H) 0.44 - 1.00 mg/dL   Calcium 9.4 8.9 - 10.3 mg/dL   Total Protein 7.5 6.5 - 8.1 g/dL   Albumin 3.6 3.5 - 5.0 g/dL   AST 57 (H) 15 - 41 U/L   ALT 65 (H) 14 - 54 U/L   Alkaline Phosphatase 56 38 - 126 U/L   Total Bilirubin 1.0 0.3 - 1.2 mg/dL   GFR calc non Af Amer 30 (L) >60 mL/min   GFR calc Af Amer 35 (L) >60 mL/min    Comment: (NOTE) The eGFR has been calculated using the CKD EPI equation. This calculation has not been validated in all clinical situations. eGFR's persistently <60 mL/min signify possible Chronic Kidney Disease.     Anion gap 11 5 - 15    Comment: Performed at [facility] [address] [place], [place] [address]  Lipase, blood     Status: None   Collection Time: 03/01/18  4:56 PM  Result Value Ref Range   Lipase 28 11 - 51 U/L    Comment: Performed at [facility] [address], [place], [place] [address]  I-stat troponin, ED     Status: None   Collection Time: 03/01/18  5:19 PM  Result Value Ref Range  Troponin i, poc 0.03 0.00 - 0.08 ng/mL   Comment 3            Comment: Due to the release kinetics of cTnI, a negative result within the first hours of the onset of symptoms does not rule out myocardial infarction with certainty. If myocardial infarction is still suspected, repeat the test at appropriate intervals.   Comprehensive metabolic panel     Status: Abnormal   Collection Time: 03/02/18 12:42 AM  Result Value Ref Range   Sodium 136 135 - 145 mmol/L   Potassium 4.4 3.5 - 5.1 mmol/L   Chloride 97 (L) 101 - 111 mmol/L   CO2 29 22 - 32 mmol/L   Glucose, Bld 167 (H) 65 - 99 mg/dL   BUN 23 (H) 6 - 20 mg/dL   Creatinine, Ser 1.26 (H) 0.44 - 1.00 mg/dL   Calcium 9.0 8.9 - 10.3 mg/dL   Total Protein 6.4 (L) 6.5 - 8.1 g/dL   Albumin 3.2 (L) 3.5 - 5.0 g/dL   AST 39 15 - 41 U/L   ALT 51 14 - 54 U/L   Alkaline Phosphatase 58 38 - 126 U/L   Total Bilirubin 0.8 0.3 - 1.2 mg/dL   GFR calc non Af Amer 37 (L) >60 mL/min   GFR calc Af Amer 43 (L) >60 mL/min    Comment: (NOTE) The eGFR has been calculated using the CKD EPI equation. This calculation has not been validated in all clinical situations. eGFR's persistently <60 mL/min signify possible Chronic Kidney Disease.    Anion gap 10 5 - 15    Comment: Performed at [facility] [address], [place], [place] [address]  CBC     Status: Abnormal   Collection Time: 03/02/18 12:42 AM  Result Value Ref Range   WBC 16.3 (H) 4.0 - 10.5 K/uL   RBC 3.63 (L) 3.87 - 5.11 MIL/uL   Hemoglobin 11.2 (L) 12.0 - 15.0 g/dL    HCT 34.5 (L) 36.0 - 46.0 %   MCV 95.0 78.0 - 100.0 fL   MCH 30.9 26.0 - 34.0 pg   MCHC 32.5 30.0 - 36.0 g/dL   RDW 13.3 11.5 - 15.5 %   Platelets 262 150 - 400 K/uL    Comment: Performed at [facility], [place] [address], [place], [place] [address]  Protime-INR     Status: Abnormal   Collection Time: 03/02/18 12:42 AM  Result Value Ref Range   Prothrombin Time 15.6 (H) 11.4 - 15.2 seconds   INR 1.25     Comment: Performed at [facility] [address], [place], [place] [address]  Hemoglobin A1c     Status: Abnormal   Collection Time: 03/02/18 12:42 AM  Result Value Ref Range   Hgb A1c MFr Bld 7.8 (H) 4.8 - 5.6 %    Comment: (NOTE) Pre diabetes:          5.7%-6.4% Diabetes:              >6.4% Glycemic control for   <7.0% adults with diabetes    Mean Plasma Glucose 177.16 mg/dL    Comment: Performed at [facility] [address], [place], [place] [address]  Type and screen [place]     Status: None   Collection Time: 03/02/18 12:48 AM  Result Value Ref Range   ABO/RH(D) O POS    Antibody Screen NEG    Sample Expiration  03/05/2018 Performed at [facility] [address], [place], [place] [address]   ABO/Rh     Status: None   Collection Time: 03/02/18 12:48 AM  Result Value Ref Range   ABO/RH(D)      O POS Performed at [facility] [address] [place], [place] [address]   Glucose, capillary     Status: Abnormal   Collection Time: 03/02/18  8:18 AM  Result Value Ref Range   Glucose-Capillary 221 (H) 65 - 99 mg/dL    Imaging / Studies: Dg Chest 2 View  Result Date: 03/01/2018 CLINICAL DATA:  Centralized chest pressure EXAM: CHEST - 2 VIEW COMPARISON:  Report 11/03/2011 FINDINGS: Atelectasis or scarring at the left base. No pleural effusion. Normal heart size. No pneumothorax. IMPRESSION: No active cardiopulmonary disease. Minimal atelectasis or scarring at the left base. Electronically Signed   By: Donavan Foil M.D.   On: 03/01/2018 19:19   Ct Abdomen Pelvis W Contrast  Result Date: 03/01/2018 CLINICAL DATA:  Abdominal pain, fever EXAM: CT ABDOMEN AND PELVIS WITH CONTRAST TECHNIQUE: Multidetector CT imaging of the abdomen and pelvis was performed using the standard protocol following bolus administration of intravenous contrast. CONTRAST:  17m OMNIPAQUE IOHEXOL 300 MG/ML  SOLN COMPARISON:  None. FINDINGS: Lower chest: Lung bases are clear. Hepatobiliary: Liver is within normal limits. Layering small gallstones (series 3/image 30), with mild gallbladder wall thickening/pericholecystic fluid. No intrahepatic or extrahepatic ductal dilatation. Pancreas: Within normal limits. Spleen: Within normal limits. Adrenals/Urinary Tract: Adrenal glands are within normal limits. Right kidney is within normal limits. 3.2 cm posterior interpolar left renal cyst (series 3/image 33). No hydronephrosis. Bladder is within normal limits. Stomach/Bowel: Stomach is notable for a moderate hiatal hernia. No evidence of bowel obstruction. Appendix is not discretely visualized. Sigmoid diverticulosis, without evidence of diverticulitis. Vascular/Lymphatic: No evidence of abdominal aortic aneurysm. Atherosclerotic calcifications of the abdominal aorta and branch vessels. No suspicious abdominopelvic lymphadenopathy. Reproductive: Uterus is within normal limits. Bilateral ovaries are within normal limits. Other: No abdominopelvic ascites. Musculoskeletal: Mild degenerative changes of the visualized thoracolumbar spine. IMPRESSION: Cholelithiasis with mild gallbladder wall thickening/pericholecystic fluid, raising concern for early acute cholecystitis. Consider hepatobiliary nuclear medicine scan for further evaluation, as clinically warranted. No evidence of bowel obstruction. Appendix not discretely visualized. Sigmoid diverticulosis, without evidence of diverticulitis. Electronically Signed   By: SJulian HyM.D.   On: 03/01/2018  20:32   UKoreaAbdomen Limited Ruq  Result Date: 03/01/2018 CLINICAL DATA:  84year old female with abnormal CT appearance of the gallbladder earlier today in the setting of abdominal pain and fever. EXAM: ULTRASOUND ABDOMEN LIMITED RIGHT UPPER QUADRANT COMPARISON:  CT Abdomen and Pelvis 2013 hours on 03/01/2018. FINDINGS: Gallbladder: Echogenic gallstones are identified individually estimated at 6 millimeters diameter. There is gallbladder wall thickening of 4-5 millimeters. Associated gallbladder wall edema or trace pericholecystic fluid. Equivocal sonographic Murphy sign (epigastric pain during scanning). Common bile duct: Diameter: 4-5 millimeters, normal. Liver: Diffusely echogenic liver parenchyma. No intrahepatic biliary ductal dilatation. No discrete liver lesion identified. Portal vein is patent on color Doppler imaging with normal direction of blood flow towards the liver. Other findings: Negative visible right kidney. IMPRESSION: 1. Highly suspicious for Acute Cholecystitis: cholelithiasis with edematous gallbladder wall but equivocal sonographic Murphy sign. 2. No evidence of CBD obstruction. 3. Fatty liver disease. Electronically Signed   By: HGenevie AnnM.D.   On: 03/01/2018 23:36    Medications / Allergies: per chart  Antibiotics: Anti-infectives (From admission, onward)  Start     Dose/Rate Route Frequency Ordered Stop   03/02/18 2200  cefTRIAXone (ROCEPHIN) 2 g in sodium chloride 0.9 % 100 mL IVPB     2 g 200 mL/hr over 30 Minutes Intravenous Every 24 hours 03/01/18 2233     03/01/18 2245  cefTRIAXone (ROCEPHIN) 1 g in sodium chloride 0.9 % 100 mL IVPB     1 g 200 mL/hr over 30 Minutes Intravenous NOW 03/01/18 2233 03/01/18 2317   03/01/18 2115  cefTRIAXone (ROCEPHIN) 1 g in sodium chloride 0.9 % 100 mL IVPB     1 g 200 mL/hr over 30 Minutes Intravenous  Once 03/01/18 2100 03/01/18 2159        Note: Portions of this report may have been transcribed using voice recognition  software. Every effort was made to ensure accuracy; however, inadvertent computerized transcription errors may be present.   Any transcriptional errors that result from this process are unintentional.     Adin Hector, M.D., F.A.C.S. Gastrointestinal and Minimally Invasive Surgery [facility] [address] [place], [place] [address] [phone] Main / Paging   03/02/2018

## 2018-03-02 NOTE — Transfer of Care (Signed)
Immediate Anesthesia Transfer of Care Note  Patient: Nancy Webster  Procedure(s) Performed: LAPAROSCOPIC CHOLECYSTECTOMY (N/A Abdomen)  Patient Location: PACU  Anesthesia Type:General  Level of Consciousness: awake, alert , oriented and patient cooperative  Airway & Oxygen Therapy: Patient Spontanous Breathing and Patient connected to face mask oxygen  Post-op Assessment: Report given to RN and Post -op Vital signs reviewed and stable  Post vital signs: Reviewed and stable  Last Vitals:  Vitals Value Taken Time  BP 168/66 03/02/2018  1:43 PM  Temp    Pulse 72 03/02/2018  1:48 PM  Resp 21 03/02/2018  1:48 PM  SpO2 100 % 03/02/2018  1:48 PM  Vitals shown include unvalidated device data.  Last Pain:  Vitals:   03/02/18 0900  TempSrc:   PainSc: 2       Patients Stated Pain Goal: 4 (95/74/73 4037)  Complications: No apparent anesthesia complications

## 2018-03-02 NOTE — Interval H&P Note (Signed)
History and Physical Interval Note:  03/02/2018 12:02 PM  [name]  has presented today for surgery, with the diagnosis of cholecystitis  The various methods of treatment have been discussed with the patient and family. After consideration of risks, benefits and other options for treatment, the patient has consented to  Procedure(s): LAPAROSCOPIC CHOLECYSTECTOMY WITH INTRAOPERATIVE CHOLANGIOGRAM (N/A) as a surgical intervention .  The patient's history has been reviewed, patient examined, no change in status, stable for surgery.  I have reviewed the patient's chart and labs.  Questions were answered to the patient's satisfaction.    I believe the patient's symptoms are consistent with gallbladder disease.  We discussed gallbladder disease.   I discussed laparoscopic cholecystectomy with IOC in detail.  The patient was shown diagrams detailing the procedure.  We discussed the risks and benefits of a laparoscopic cholecystectomy including, but not limited to bleeding, infection, injury to surrounding structures such as the intestine or liver, bile leak, retained gallstones, need to convert to an open procedure, prolonged diarrhea, blood clots such as  DVT, common bile duct injury, anesthesia risks, and possible need for additional procedures.  We discussed the typical post-operative recovery course. I explained that the likelihood of improvement of their symptoms is good.   Nancy Webster

## 2018-03-02 NOTE — Op Note (Signed)
Nancy Webster [id number] Oct 24, 1928 03/02/2018  Laparoscopic Cholecystectomy  Procedure Note  Indications: This patient presents with symptomatic gallbladder disease and will undergo laparoscopic cholecystectomy.  Pre-operative Diagnosis: Calculus of gallbladder with acute cholecystitis, without mention of obstruction  Post-operative Diagnosis: purulent calculous cholecystitis  Surgeon: Greer Pickerel MD FACS  Assistants: Michael Boston MD FACS-an assistant was requested due to the acute inflammatory nature of her cholecystitis.  Moreover a fourth trocar and to be added to help retract the left lobe of the liver to adequately expose the gallbladder  Anesthesia: General endotracheal anesthesia   Procedure Details  The patient was seen again in the Holding Room. The risks, benefits, complications, treatment options, and expected outcomes were discussed with the patient. The possibilities of reaction to medication, pulmonary aspiration, perforation of viscus, bleeding, recurrent infection, finding a normal gallbladder, the need for additional procedures, failure to diagnose a condition, the possible need to convert to an open procedure, and creating a complication requiring transfusion or operation were discussed with the patient. The likelihood of improving the patient's symptoms with return to their baseline status is good.  The patient and/or family concurred with the proposed plan, giving informed consent. The site of surgery properly noted. The patient was taken to Operating Room, identified as Nancy Webster and the procedure verified as Laparoscopic Cholecystectomy with Intraoperative Cholangiogram. A Time Out was held and the above information confirmed. Antibiotic prophylaxis was administered.   Prior to the induction of general anesthesia, antibiotic prophylaxis was administered. General endotracheal anesthesia was then administered and tolerated well. After the induction, the abdomen was prepped  with Chloraprep and draped in the sterile fashion. The patient was positioned in the supine position.  Local anesthetic agent was injected into the skin near the umbilicus and an incision made. We dissected down to the abdominal fascia with blunt dissection.  The fascia was incised vertically and we entered the peritoneal cavity bluntly.  A pursestring suture of 0-Vicryl was placed around the fascial opening.  The Hasson cannula was inserted and secured with the stay suture.  Pneumoperitoneum was then created with CO2 and tolerated well without any adverse changes in the patient's vital signs. An 5-mm port was placed in the subxiphoid position.  Two 5-mm ports were placed in the right upper quadrant. All skin incisions were infiltrated with a local anesthetic agent before making the incision and placing the trocars.   We positioned the patient in reverse Trendelenburg, tilted slightly to the patient's left.  The gallbladder was identified.  There was some purulent exudate along the medial portion of the gallbladder.  The gallbladder was distended.  It was aspirated in order to decompress it. the fundus was grasped and retracted cephalad.  Friable omental adhesions were lysed bluntly and with the electrocautery where indicated, taking care not to injure any adjacent organs or viscus.  The left lobe of the liver draped over the infundibulum of the gallbladder.  Therefore I elected to place a fourth 5 mm trocar in the left mid abdomen under direct visualizations of the left lobe of the liver could be retracted out of the way to expose the infundibulum. the infundibulum was grasped and retracted laterally, exposing the peritoneum overlying the triangle of Calot. This was then divided and exposed in a blunt fashion. A critical view of the cystic duct and cystic artery was obtained.  The cystic duct was clearly identified and bluntly dissected circumferentially.  The cystic duct appeared shortened due to all the  surrounding inflammation  therefore I decided not to do a cholangiogram.    The cystic duct was then ligated with clips and divided. The cystic artery which had been identified & dissected free was ligated with clips and divided as well.   The gallbladder was dissected from the liver bed in retrograde fashion with the electrocautery.  The posterior wall of the gallbladder was adhered to the liver capsule due to its inflammation.  The gallbladder was removed and placed in an Ecco sac.  The gallbladder and Ecco sac were then removed through the umbilical port site. The liver bed was irrigated and inspected. Hemostasis was achieved with the electrocautery. Copious irrigation was utilized and was repeatedly aspirated until clear.  I did elect to place a piece of surgical snow in the gallbladder fossa.  The pursestring suture was used to close the umbilical fascia.    We again inspected the right upper quadrant for hemostasis.  The umbilical closure was inspected and there was no air leak and nothing trapped within the closure. Pneumoperitoneum was released as we removed the trocars.  4-0 Monocryl was used to close the skin.   Benzoin, steri-strips, and clean dressings were applied. The patient was then extubated and brought to the recovery room in stable condition. Instrument, sponge, and needle counts were correct at closure and at the conclusion of the case.   Findings: Acute Cholecystitis with Cholelithiasis +snow  Estimated Blood Loss: less than 50 mL         Drains: none         Specimens: Gallbladder           Complications: None; patient tolerated the procedure well.         Disposition: PACU - hemodynamically stable.         Condition: stable  Nancy Webster. Nancy Pulling, MD, FACS General, Bariatric, & Minimally Invasive Surgery [facility], [place]

## 2018-03-02 NOTE — Anesthesia Procedure Notes (Signed)
Procedure Name: Intubation Date/Time: 03/02/2018 12:18 PM Performed by: Cleda Daub, CRNA Pre-anesthesia Checklist: Patient identified, Suction available, Emergency Drugs available and Patient being monitored Patient Re-evaluated:Patient Re-evaluated prior to induction Oxygen Delivery Method: Circle system utilized Preoxygenation: Pre-oxygenation with 100% oxygen Induction Type: IV induction Ventilation: Mask ventilation without difficulty Laryngoscope Size: Mac and 3 Grade View: Grade I Tube type: Oral Tube size: 7.0 mm Number of attempts: 1 Airway Equipment and Method: Stylet Placement Confirmation: ETT inserted through vocal cords under direct vision,  positive ETCO2 and breath sounds checked- equal and bilateral Secured at: 19 cm Tube secured with: Tape Dental Injury: Teeth and Oropharynx as per pre-operative assessment

## 2018-03-02 NOTE — Anesthesia Postprocedure Evaluation (Signed)
Anesthesia Post Note  Patient: Nancy Webster  Procedure(s) Performed: LAPAROSCOPIC CHOLECYSTECTOMY (N/A Abdomen)     Patient location during evaluation: PACU Anesthesia Type: General Level of consciousness: awake and alert Pain management: pain level controlled Vital Signs Assessment: post-procedure vital signs reviewed and stable Respiratory status: spontaneous breathing, nonlabored ventilation, respiratory function stable and patient connected to nasal cannula oxygen Cardiovascular status: blood pressure returned to baseline and stable Postop Assessment: no apparent nausea or vomiting Anesthetic complications: no    Last Vitals:  Vitals:   03/02/18 1423 03/02/18 1430  BP:  (!) 166/62  Pulse:  80  Resp:  13  Temp: 36.5 C   SpO2:  100%    Last Pain:  Vitals:   03/02/18 1423  TempSrc:   PainSc: 8                  Jillana Selph COKER

## 2018-03-02 NOTE — Progress Notes (Signed)
Patient Demographics:    Nancy Webster, is a 84 y.o. female, DOB - 1928/02/15, HUD:[id number]  Admit date - 03/01/2018   Admitting Physician Gwynne Edinger, MD  Outpatient Primary MD for the patient is Lajean Manes, MD  LOS - 1   Chief Complaint  Patient presents with  . Chest Pain        Subjective:    Nancy Webster today has no fevers, no emesis,  No chest pain, family at bedside, questions answered  Assessment  & Plan :    Principal Problem:   Acute cholecystitis Active Problems:   AKI (acute kidney injury) ([place])   Essential hypertension   Paroxysmal SVT (supraventricular tachycardia) ([facility])   Diabetes mellitus ([place])   Trigeminal neuralgia   Generalized anxiety disorder   Brief Summary 84 y.o. female with medical history significant for htn, dm, trigeminal neuralgia, paroxysmal svt admitted on 03/01/2018 with abdominal pain secondary to acute calculus cholecystitis   Plan- 1)purulent calculous cholecystitis- Planned  cholecystectomy on 03/02/2018, further care per surgical team, follow LFT  2)AKI-acute kidney injury, due to poor oral intake in the setting of abdominal pain and acute cholecystitis, hydrate and follow renal function closely, continue to hold benazepril and Lasix until patient is euvolemic  3)DM-last A1c 7.8, Allow some permissive Hyperglycemia rather than risk life-threatening hypoglycemia in a patient with unreliable oral intake. Use Novolog/Humalog Sliding scale insulin with Accu-Cheks/Fingersticks as ordered  4)HTN-  may use IV Hydralazine 10 mg  Every 4 hours Prn for systolic blood pressure over 160 mmhg, otherwise hold routine  BP meds for now   Code Status : DNR   Disposition Plan  : TBD  Consults  :  Gen Surge   DVT Prophylaxis  :  Lovenox -  Lab Results  Component Value Date   PLT 262 03/02/2018    Inpatient Medications  Scheduled Meds: . acetaminophen   500 mg Oral Q6H  . ALPRAZolam  0.75 mg Oral QHS  . cycloSPORINE  1 drop Both Eyes BID  . [START ON 03/03/2018] enoxaparin (LOVENOX) injection  40 mg Subcutaneous Q24H  . fentaNYL      . gabapentin  100 mg Oral QHS  . insulin aspart  0-15 Units Subcutaneous TID WC  . isosorbide mononitrate  30 mg Oral Daily  . lip balm   Topical BID  . pantoprazole  40 mg Oral Daily   Continuous Infusions: . cefTRIAXone (ROCEPHIN)  IV    . dextrose 5 % and 0.45% NaCl 50 mL/hr at 03/02/18 1602  . methocarbamol (ROBAXIN)  IV     PRN Meds:.alum & mag hydroxide-simeth, aspirin-acetaminophen-caffeine, bisacodyl, enalaprilat, guaiFENesin-dextromethorphan, hydrocortisone, hydrocortisone cream, magic mouthwash, menthol-cetylpyridinium, methocarbamol (ROBAXIN)  IV, metoCLOPramide (REGLAN) injection, metoprolol tartrate, morphine injection, oxyCODONE, phenol, prochlorperazine    Anti-infectives (From admission, onward)   Start     Dose/Rate Route Frequency Ordered Stop   03/02/18 2200  cefTRIAXone (ROCEPHIN) 2 g in sodium chloride 0.9 % 100 mL IVPB     2 g 200 mL/hr over 30 Minutes Intravenous Every 24 hours 03/01/18 2233     03/01/18 2245  cefTRIAXone (ROCEPHIN) 1 g in sodium chloride 0.9 % 100 mL IVPB     1 g 200 mL/hr over 30 Minutes Intravenous NOW  03/01/18 2233 03/01/18 2317   03/01/18 2115  cefTRIAXone (ROCEPHIN) 1 g in sodium chloride 0.9 % 100 mL IVPB     1 g 200 mL/hr over 30 Minutes Intravenous  Once 03/01/18 2100 03/01/18 2159        Objective:   Vitals:   03/02/18 1423 03/02/18 1430 03/02/18 1500 03/02/18 1556  BP:  (!) 166/62 131/86 (!) 138/47  Pulse:  80 78 67  Resp:  13 15 20   Temp: 97.7 F (36.5 C)  97.6 F (36.4 C) (!) 97.5 F (36.4 C)  TempSrc:   Oral Oral  SpO2:  100% 100% 100%  Weight:      Height:        Wt Readings from Last 3 Encounters:  03/01/18 73 kg (161 lb)     Intake/Output Summary (Last 24 hours) at 03/02/2018 1750 Last data filed at 03/02/2018 1602 Gross per 24  hour  Intake 1605 ml  Output 5 ml  Net 1600 ml     Physical Exam  Gen:- Awake Alert,   HEENT:- [place].AT, No sclera icterus Neck-Supple Neck,No JVD,.  Lungs-  CTAB , good air movement CV- S1, S2 normal Abd-  +ve B.Sounds, Abd Soft, right upper quadrant and epigastric area tenderness, Extremity/Skin:- No  edema,   Good pulses Psych-affect is appropriate, oriented x3 Neuro-no new focal deficits, no tremors   Data Review:   Micro Results No results found for this or any previous visit (from the past 240 hour(s)).  Radiology Reports Dg Chest 2 View  Result Date: 03/01/2018 CLINICAL DATA:  Centralized chest pressure EXAM: CHEST - 2 VIEW COMPARISON:  Report 11/03/2011 FINDINGS: Atelectasis or scarring at the left base. No pleural effusion. Normal heart size. No pneumothorax. IMPRESSION: No active cardiopulmonary disease. Minimal atelectasis or scarring at the left base. Electronically Signed   By: Donavan Foil M.D.   On: 03/01/2018 19:19   Ct Abdomen Pelvis W Contrast  Result Date: 03/01/2018 CLINICAL DATA:  Abdominal pain, fever EXAM: CT ABDOMEN AND PELVIS WITH CONTRAST TECHNIQUE: Multidetector CT imaging of the abdomen and pelvis was performed using the standard protocol following bolus administration of intravenous contrast. CONTRAST:  15mL OMNIPAQUE IOHEXOL 300 MG/ML  SOLN COMPARISON:  None. FINDINGS: Lower chest: Lung bases are clear. Hepatobiliary: Liver is within normal limits. Layering small gallstones (series 3/image 30), with mild gallbladder wall thickening/pericholecystic fluid. No intrahepatic or extrahepatic ductal dilatation. Pancreas: Within normal limits. Spleen: Within normal limits. Adrenals/Urinary Tract: Adrenal glands are within normal limits. Right kidney is within normal limits. 3.2 cm posterior interpolar left renal cyst (series 3/image 33). No hydronephrosis. Bladder is within normal limits. Stomach/Bowel: Stomach is notable for a moderate hiatal hernia. No evidence of  bowel obstruction. Appendix is not discretely visualized. Sigmoid diverticulosis, without evidence of diverticulitis. Vascular/Lymphatic: No evidence of abdominal aortic aneurysm. Atherosclerotic calcifications of the abdominal aorta and branch vessels. No suspicious abdominopelvic lymphadenopathy. Reproductive: Uterus is within normal limits. Bilateral ovaries are within normal limits. Other: No abdominopelvic ascites. Musculoskeletal: Mild degenerative changes of the visualized thoracolumbar spine. IMPRESSION: Cholelithiasis with mild gallbladder wall thickening/pericholecystic fluid, raising concern for early acute cholecystitis. Consider hepatobiliary nuclear medicine scan for further evaluation, as clinically warranted. No evidence of bowel obstruction. Appendix not discretely visualized. Sigmoid diverticulosis, without evidence of diverticulitis. Electronically Signed   By: Julian Hy M.D.   On: 03/01/2018 20:32   US Abdomen Limited Ruq  Result Date: 03/01/2018 CLINICAL DATA:  84 year old female with abnormal CT appearance of the gallbladder earlier  today in the setting of abdominal pain and fever. EXAM: ULTRASOUND ABDOMEN LIMITED RIGHT UPPER QUADRANT COMPARISON:  CT Abdomen and Pelvis 2013 hours on 03/01/2018. FINDINGS: Gallbladder: Echogenic gallstones are identified individually estimated at 6 millimeters diameter. There is gallbladder wall thickening of 4-5 millimeters. Associated gallbladder wall edema or trace pericholecystic fluid. Equivocal sonographic Murphy sign (epigastric pain during scanning). Common bile duct: Diameter: 4-5 millimeters, normal. Liver: Diffusely echogenic liver parenchyma. No intrahepatic biliary ductal dilatation. No discrete liver lesion identified. Portal vein is patent on color Doppler imaging with normal direction of blood flow towards the liver. Other findings: Negative visible right kidney. IMPRESSION: 1. Highly suspicious for Acute Cholecystitis: cholelithiasis  with edematous gallbladder wall but equivocal sonographic Murphy sign. 2. No evidence of CBD obstruction. 3. Fatty liver disease. Electronically Signed   By: Genevie Ann M.D.   On: 03/01/2018 23:36     CBC Recent Labs  Lab 03/01/18 1656 03/02/18 0042  WBC 19.1* 16.3*  HGB 11.6* 11.2*  HCT 35.4* 34.5*  PLT 284 262  MCV 94.9 95.0  MCH 31.1 30.9  MCHC 32.8 32.5  RDW 13.2 13.3  LYMPHSABS 3.1  --   MONOABS 0.9  --   EOSABS 0.0  --   BASOSABS 0.0  --     Chemistries  Recent Labs  Lab 03/01/18 1656 03/02/18 0042  NA 137 136  K 3.7 4.4  CL 98* 97*  CO2 28 29  GLUCOSE 199* 167*  BUN 25* 23*  CREATININE 1.51* 1.26*  CALCIUM 9.4 9.0  AST 57* 39  ALT 65* 51  ALKPHOS 56 58  BILITOT 1.0 0.8   ------------------------------------------------------------------------------------------------------------------ No results for input(s): CHOL, HDL, LDLCALC, TRIG, CHOLHDL, LDLDIRECT in the last 72 hours.  Lab Results  Component Value Date   HGBA1C 7.8 (H) 03/02/2018   ------------------------------------------------------------------------------------------------------------------ No results for input(s): TSH, T4TOTAL, T3FREE, THYROIDAB in the last 72 hours.  Invalid input(s): FREET3 ------------------------------------------------------------------------------------------------------------------ No results for input(s): VITAMINB12, FOLATE, FERRITIN, TIBC, IRON, RETICCTPCT in the last 72 hours.  Coagulation profile Recent Labs  Lab 03/02/18 0042  INR 1.25    No results for input(s): DDIMER in the last 72 hours.  Cardiac Enzymes No results for input(s): CKMB, TROPONINI, MYOGLOBIN in the last 168 hours.  Invalid input(s): CK ------------------------------------------------------------------------------------------------------------------ No results found for: BNP   Roxan Hockey M.D on 03/02/2018 at 5:50 PM  Between 7am to 7pm - Pager - [phone]  After 7pm go to  [email or web address] - password [email or web address]  [facility] -  Office  [phone]   Voice Recognition Viviann Spare dictation system was used to create this note, attempts have been made to correct errors. Please contact the author with questions and/or clarifications.

## 2018-03-02 NOTE — Progress Notes (Signed)
Per. Dr.  Denton Brick it is ok for patient to transport to surgery off tele monitor.

## 2018-03-02 NOTE — Progress Notes (Signed)
NURSING PROGRESS NOTE  [name] [id number] Admission Data: 03/02/2018 5:36 AM Attending Provider: Roxan Hockey, MD [id number], Hal, MD Code Status: DNR  Allergies:  Statins Past Medical History:   has a past medical history of Anxiety, Coronary artery disease, Diabetes mellitus without complication ([place]), GERD (gastroesophageal reflux disease), Hypertension, and Renal disorder. Past Surgical History:   has no past surgical history on file. Social History:   reports that she has never smoked. She has never used smokeless tobacco. She reports that she drank alcohol. She reports that she does not use drugs.  Nancy Webster is a 84 y.o. female patient admitted from ED:   Last Documented Vital Signs: Blood pressure (!) 128/53, pulse 78, temperature 99.2 F (37.3 C), resp. rate 18, height 5' 3.5" (1.613 m), weight 73 kg (161 lb), SpO2 95 %.  Cardiac Monitoring: Box # 25 in place. Cardiac monitor yields:normal sinus rhythm.  IV Fluids:  IV in place, occlusive dsg intact without redness, IV cath antecubital left, condition patent and no redness none.   Skin: Appropriate for ethnicity and intact with exception of abrasion on right lower leg. Patient reports this is a scratch from her cat.  Patient orientated to room. Information packet given to patient/family. Admission inpatient armband information verified with patient to include name and date of birth and placed on patient arm. Side rails up x 2, fall assessment and education completed with patient. Patient able to verbalize understanding of risk associated with falls and verbalized understanding to call for assistance before getting out of bed. Call light within reach. Patient able to voice and demonstrate understanding of unit orientation instructions.    Will continue to evaluate and treat per MD orders.  Clydell Hakim RN, BSN

## 2018-03-02 NOTE — H&P (View-Only) (Signed)
[address]  [address] [place], [place] [address] Phone: [phone]  FAX: [phone]      Nancy Webster [id number] 1928-06-12  CARE TEAM:  PCP: Lajean Manes, MD  Outpatient Care Team: Patient Care Team: Lajean Manes, MD as PCP - General (Internal Medicine)  Inpatient Treatment Team: Treatment Team: Attending Provider: Roxan Hockey, MD; Consulting Physician: Edison Pace, Md, MD; Rounding Team: Sharyon Medicus, MD; Registered Nurse: Jackson, Niger N, RN   Problem List:   Principal Problem:   Acute cholecystitis Active Problems:   AKI (acute kidney injury) [facility])   Essential hypertension   Paroxysmal SVT (supraventricular tachycardia) ([place])   Diabetes mellitus ([place])   Trigeminal neuralgia   Generalized anxiety disorder      * No surgery found *     Assessment  Acute cholecystitis  Plan:  Lap chole today vs AM:  The anatomy & physiology of hepatobiliary & pancreatic function was discussed.  The pathophysiology of gallbladder dysfunction was discussed.  Natural history risks without surgery was discussed.   I feel the risks of no intervention will lead to serious problems that outweigh the operative risks; therefore, I recommended cholecystectomy to remove the pathology.  I explained laparoscopic techniques with possible need for an open approach.  Probable cholangiogram to evaluate the bilary tract was explained as well.    Risks such as bleeding, infection, abscess, leak, injury to other organs, need for repair of tissues / organs, need for further treatment, stroke, heart attack, death, and other risks were discussed.  I noted a good likelihood this will help address the problem.  Possibility that this will not correct all abdominal symptoms was explained.  Goals of post-operative recovery were discussed as well.  We will work to minimize complications.  An educational handout further explaining the pathology and  treatment options was given as well.  Questions were answered.  The patient expresses understanding & wishes to proceed with surgery.  -IV Abx - ceftriaxone -HTN - PRN for for.  Hold lasix for now -HA - Excedrin OK -VTE prophylaxis- SCDs, etc -mobilize as tolerated to help recovery  25 minutes spent in review, evaluation, examination, counseling, and coordination of care.  More than 50% of that time was spent in counseling.  Adin Hector, M.D., F.A.C.S. Gastrointestinal and Minimally Invasive Surgery [facility] [address], [place], [place] [address] [phone] Main / Paging   03/02/2018    Subjective: (Chief complaint)  Less sore since admission last night C/o HA Worried about missing home meds Wants to get surgery ASAP & "not wait forever"  Objective:  Vital signs:  Vitals:   03/01/18 2330 03/01/18 2341 03/02/18 0006 03/02/18 0536  BP: (!) 119/56  (!) 128/53 (!) 110/44  Pulse: 88  78 81  Resp: (!) 25  18 18   Temp:  98.7 F (37.1 C) 99.2 F (37.3 C)   TempSrc:  Oral    SpO2: 92%  95% 93%  Weight:      Height:        Last BM Date: 02/28/18  Intake/Output   Yesterday:  05/03 0701 - 05/04 0700 In: 740 [I.V.:540; IV Piggyback:200] Out: -  This shift:  No intake/output data recorded.  Bowel function:  Flatus: YES  BM:  No  Drain: (No drain)   Physical Exam:  General: Pt awake/alert/oriented x4 in no acute distress Eyes: PERRL, normal EOM.  Sclera clear.  No icterus Neuro: CN II-XII intact  w/o focal sensory/motor deficits. Lymph: No head/neck/groin lymphadenopathy Psych:  No delerium/psychosis/paranoia HENT: Normocephalic, Mucus membranes moist.  No thrush Neck: Supple, No tracheal deviation Chest: No chest wall pain w good excursion CV:  Pulses intact.  Regular rhythm MS: Normal AROM mjr joints.  No obvious deformity  Abdomen: Soft.  Nondistended.  Tenderness at RUQ/epigastric.  No evidence of  peritonitis.  No incarcerated hernias.  Ext:  No deformity.  No mjr edema.  No cyanosis Skin: No petechiae / purpura  Results:   Labs: Results for orders placed or performed during the hospital encounter of 03/01/18 (from the past 48 hour(s))  CBC with Differential     Status: Abnormal   Collection Time: 03/01/18  4:56 PM  Result Value Ref Range   WBC 19.1 (H) 4.0 - 10.5 K/uL   RBC 3.73 (L) 3.87 - 5.11 MIL/uL   Hemoglobin 11.6 (L) 12.0 - 15.0 g/dL   HCT 35.4 (L) 36.0 - 46.0 %   MCV 94.9 78.0 - 100.0 fL   MCH 31.1 26.0 - 34.0 pg   MCHC 32.8 30.0 - 36.0 g/dL   RDW 13.2 11.5 - 15.5 %   Platelets 284 150 - 400 K/uL   Neutrophils Relative % 79 %   Neutro Abs 15.0 (H) 1.7 - 7.7 K/uL   Lymphocytes Relative 16 %   Lymphs Abs 3.1 0.7 - 4.0 K/uL   Monocytes Relative 5 %   Monocytes Absolute 0.9 0.1 - 1.0 K/uL   Eosinophils Relative 0 %   Eosinophils Absolute 0.0 0.0 - 0.7 K/uL   Basophils Relative 0 %   Basophils Absolute 0.0 0.0 - 0.1 K/uL    Comment: Performed at [facility], [address], [place], [place] [address]  Comprehensive metabolic panel     Status: Abnormal   Collection Time: 03/01/18  4:56 PM  Result Value Ref Range   Sodium 137 135 - 145 mmol/L   Potassium 3.7 3.5 - 5.1 mmol/L   Chloride 98 (L) 101 - 111 mmol/L   CO2 28 22 - 32 mmol/L   Glucose, Bld 199 (H) 65 - 99 mg/dL   BUN 25 (H) 6 - 20 mg/dL   Creatinine, Ser 1.51 (H) 0.44 - 1.00 mg/dL   Calcium 9.4 8.9 - 10.3 mg/dL   Total Protein 7.5 6.5 - 8.1 g/dL   Albumin 3.6 3.5 - 5.0 g/dL   AST 57 (H) 15 - 41 U/L   ALT 65 (H) 14 - 54 U/L   Alkaline Phosphatase 56 38 - 126 U/L   Total Bilirubin 1.0 0.3 - 1.2 mg/dL   GFR calc non Af Amer 30 (L) >60 mL/min   GFR calc Af Amer 35 (L) >60 mL/min    Comment: (NOTE) The eGFR has been calculated using the CKD EPI equation. This calculation has not been validated in all clinical situations. eGFR's persistently <60 mL/min signify possible Chronic Kidney Disease.     Anion gap 11 5 - 15    Comment: Performed at [facility] [address] [place], [place] [address]  Lipase, blood     Status: None   Collection Time: 03/01/18  4:56 PM  Result Value Ref Range   Lipase 28 11 - 51 U/L    Comment: Performed at [facility] [address], [place], [place] [address]  I-stat troponin, ED     Status: None   Collection Time: 03/01/18  5:19 PM  Result Value Ref Range  Troponin i, poc 0.03 0.00 - 0.08 ng/mL   Comment 3            Comment: Due to the release kinetics of cTnI, a negative result within the first hours of the onset of symptoms does not rule out myocardial infarction with certainty. If myocardial infarction is still suspected, repeat the test at appropriate intervals.   Comprehensive metabolic panel     Status: Abnormal   Collection Time: 03/02/18 12:42 AM  Result Value Ref Range   Sodium 136 135 - 145 mmol/L   Potassium 4.4 3.5 - 5.1 mmol/L   Chloride 97 (L) 101 - 111 mmol/L   CO2 29 22 - 32 mmol/L   Glucose, Bld 167 (H) 65 - 99 mg/dL   BUN 23 (H) 6 - 20 mg/dL   Creatinine, Ser 1.26 (H) 0.44 - 1.00 mg/dL   Calcium 9.0 8.9 - 10.3 mg/dL   Total Protein 6.4 (L) 6.5 - 8.1 g/dL   Albumin 3.2 (L) 3.5 - 5.0 g/dL   AST 39 15 - 41 U/L   ALT 51 14 - 54 U/L   Alkaline Phosphatase 58 38 - 126 U/L   Total Bilirubin 0.8 0.3 - 1.2 mg/dL   GFR calc non Af Amer 37 (L) >60 mL/min   GFR calc Af Amer 43 (L) >60 mL/min    Comment: (NOTE) The eGFR has been calculated using the CKD EPI equation. This calculation has not been validated in all clinical situations. eGFR's persistently <60 mL/min signify possible Chronic Kidney Disease.    Anion gap 10 5 - 15    Comment: Performed at [facility] [address], [place], [place] [address]  CBC     Status: Abnormal   Collection Time: 03/02/18 12:42 AM  Result Value Ref Range   WBC 16.3 (H) 4.0 - 10.5 K/uL   RBC 3.63 (L) 3.87 - 5.11 MIL/uL   Hemoglobin 11.2 (L) 12.0 - 15.0 g/dL    HCT 34.5 (L) 36.0 - 46.0 %   MCV 95.0 78.0 - 100.0 fL   MCH 30.9 26.0 - 34.0 pg   MCHC 32.5 30.0 - 36.0 g/dL   RDW 13.3 11.5 - 15.5 %   Platelets 262 150 - 400 K/uL    Comment: Performed at [facility], [place] [address], [place], [place] [address]  Protime-INR     Status: Abnormal   Collection Time: 03/02/18 12:42 AM  Result Value Ref Range   Prothrombin Time 15.6 (H) 11.4 - 15.2 seconds   INR 1.25     Comment: Performed at [facility] [address], [place], [place] [address]  Hemoglobin A1c     Status: Abnormal   Collection Time: 03/02/18 12:42 AM  Result Value Ref Range   Hgb A1c MFr Bld 7.8 (H) 4.8 - 5.6 %    Comment: (NOTE) Pre diabetes:          5.7%-6.4% Diabetes:              >6.4% Glycemic control for   <7.0% adults with diabetes    Mean Plasma Glucose 177.16 mg/dL    Comment: Performed at [facility] [address] [place],  [address]  Type and screen [place]     Status: None   Collection Time: 03/02/18 12:48 AM  Result Value Ref Range   ABO/RH(D) O POS    Antibody Screen NEG    Sample Expiration  03/05/2018 Performed at [facility] [address] [place], [place] [address]   ABO/Rh     Status: None   Collection Time: 03/02/18 12:48 AM  Result Value Ref Range   ABO/RH(D)      O POS Performed at [facility] [address], [place], [place] [address]   Glucose, capillary     Status: Abnormal   Collection Time: 03/02/18  8:18 AM  Result Value Ref Range   Glucose-Capillary 221 (H) 65 - 99 mg/dL    Imaging / Studies: Dg Chest 2 View  Result Date: 03/01/2018 CLINICAL DATA:  Centralized chest pressure EXAM: CHEST - 2 VIEW COMPARISON:  Report 11/03/2011 FINDINGS: Atelectasis or scarring at the left base. No pleural effusion. Normal heart size. No pneumothorax. IMPRESSION: No active cardiopulmonary disease. Minimal atelectasis or scarring at the left base. Electronically Signed   By: Donavan Foil M.D.   On: 03/01/2018 19:19   Ct Abdomen Pelvis W Contrast  Result Date: 03/01/2018 CLINICAL DATA:  Abdominal pain, fever EXAM: CT ABDOMEN AND PELVIS WITH CONTRAST TECHNIQUE: Multidetector CT imaging of the abdomen and pelvis was performed using the standard protocol following bolus administration of intravenous contrast. CONTRAST:  13m OMNIPAQUE IOHEXOL 300 MG/ML  SOLN COMPARISON:  None. FINDINGS: Lower chest: Lung bases are clear. Hepatobiliary: Liver is within normal limits. Layering small gallstones (series 3/image 30), with mild gallbladder wall thickening/pericholecystic fluid. No intrahepatic or extrahepatic ductal dilatation. Pancreas: Within normal limits. Spleen: Within normal limits. Adrenals/Urinary Tract: Adrenal glands are within normal limits. Right kidney is within normal limits. 3.2 cm posterior interpolar left renal cyst (series 3/image 33). No hydronephrosis. Bladder is within normal limits. Stomach/Bowel: Stomach is notable for a moderate hiatal hernia. No evidence of bowel obstruction. Appendix is not discretely visualized. Sigmoid diverticulosis, without evidence of diverticulitis. Vascular/Lymphatic: No evidence of abdominal aortic aneurysm. Atherosclerotic calcifications of the abdominal aorta and branch vessels. No suspicious abdominopelvic lymphadenopathy. Reproductive: Uterus is within normal limits. Bilateral ovaries are within normal limits. Other: No abdominopelvic ascites. Musculoskeletal: Mild degenerative changes of the visualized thoracolumbar spine. IMPRESSION: Cholelithiasis with mild gallbladder wall thickening/pericholecystic fluid, raising concern for early acute cholecystitis. Consider hepatobiliary nuclear medicine scan for further evaluation, as clinically warranted. No evidence of bowel obstruction. Appendix not discretely visualized. Sigmoid diverticulosis, without evidence of diverticulitis. Electronically Signed   By: SJulian HyM.D.   On: 03/01/2018  20:32   [place]domen Limited Ruq  Result Date: 03/01/2018 CLINICAL DATA:  84year old female with abnormal CT appearance of the gallbladder earlier today in the setting of abdominal pain and fever. EXAM: ULTRASOUND ABDOMEN LIMITED RIGHT UPPER QUADRANT COMPARISON:  CT Abdomen and Pelvis 2013 hours on 03/01/2018. FINDINGS: Gallbladder: Echogenic gallstones are identified individually estimated at 6 millimeters diameter. There is gallbladder wall thickening of 4-5 millimeters. Associated gallbladder wall edema or trace pericholecystic fluid. Equivocal sonographic Murphy sign (epigastric pain during scanning). Common bile duct: Diameter: 4-5 millimeters, normal. Liver: Diffusely echogenic liver parenchyma. No intrahepatic biliary ductal dilatation. No discrete liver lesion identified. Portal vein is patent on color Doppler imaging with normal direction of blood flow towards the liver. Other findings: Negative visible right kidney. IMPRESSION: 1. Highly suspicious for Acute Cholecystitis: cholelithiasis with edematous gallbladder wall but equivocal sonographic Murphy sign. 2. No evidence of CBD obstruction. 3. Fatty liver disease. Electronically Signed   By: HGenevie AnnM.D.   On: 03/01/2018 23:36    Medications / Allergies: per chart  Antibiotics: Anti-infectives (From admission, onward)  Start     Dose/Rate Route Frequency Ordered Stop   03/02/18 2200  cefTRIAXone (ROCEPHIN) 2 g in sodium chloride 0.9 % 100 mL IVPB     2 g 200 mL/hr over 30 Minutes Intravenous Every 24 hours 03/01/18 2233     03/01/18 2245  cefTRIAXone (ROCEPHIN) 1 g in sodium chloride 0.9 % 100 mL IVPB     1 g 200 mL/hr over 30 Minutes Intravenous NOW 03/01/18 2233 03/01/18 2317   03/01/18 2115  cefTRIAXone (ROCEPHIN) 1 g in sodium chloride 0.9 % 100 mL IVPB     1 g 200 mL/hr over 30 Minutes Intravenous  Once 03/01/18 2100 03/01/18 2159        Note: Portions of this report may have been transcribed using voice recognition  software. Every effort was made to ensure accuracy; however, inadvertent computerized transcription errors may be present.   Any transcriptional errors that result from this process are unintentional.     Adin Hector, M.D., F.A.C.S. Gastrointestinal and Minimally Invasive Surgery [facility] [address] [place], [place] [address] [phone] Main / Paging   03/02/2018

## 2018-03-03 ENCOUNTER — Encounter (HOSPITAL_COMMUNITY): Payer: Self-pay | Admitting: General Surgery

## 2018-03-03 LAB — BASIC METABOLIC PANEL
ANION GAP: 11 (ref 5–15)
BUN: 10 mg/dL (ref 6–20)
CO2: 25 mmol/L (ref 22–32)
Calcium: 8 mg/dL — ABNORMAL LOW (ref 8.9–10.3)
Chloride: 99 mmol/L — ABNORMAL LOW (ref 101–111)
Creatinine, Ser: 1.05 mg/dL — ABNORMAL HIGH (ref 0.44–1.00)
GFR, EST AFRICAN AMERICAN: 54 mL/min — AB (ref >60)
GFR, EST NON AFRICAN AMERICAN: 46 mL/min — AB (ref >60)
GLUCOSE: 263 mg/dL — AB (ref 65–99)
POTASSIUM: 4.1 mmol/L (ref 3.5–5.1)
Sodium: 135 mmol/L (ref 135–145)

## 2018-03-03 LAB — CBC
HEMATOCRIT: 36.7 % (ref 36.0–46.0)
Hemoglobin: 11.9 g/dL — ABNORMAL LOW (ref 12.0–15.0)
MCH: 31 pg (ref 26.0–34.0)
MCHC: 32.4 g/dL (ref 30.0–36.0)
MCV: 95.6 fL (ref 78.0–100.0)
PLATELETS: 270 10*3/uL (ref 150–400)
RBC: 3.84 MIL/uL — AB (ref 3.87–5.11)
RDW: 13.4 % (ref 11.5–15.5)
WBC: 14.4 10*3/uL — AB (ref 4.0–10.5)

## 2018-03-03 LAB — GLUCOSE, CAPILLARY
GLUCOSE-CAPILLARY: 164 mg/dL — AB (ref 65–99)
Glucose-Capillary: 315 mg/dL — ABNORMAL HIGH (ref 65–99)
Glucose-Capillary: 184 mg/dL — ABNORMAL HIGH (ref 65–99)
Glucose-Capillary: 233 mg/dL — ABNORMAL HIGH (ref 65–99)

## 2018-03-03 MED ORDER — METOPROLOL TARTRATE 12.5 MG HALF TABLET
12.5000 mg | ORAL_TABLET | Freq: Two times a day (BID) | ORAL | Status: DC
  Filled 2018-03-03: qty 1

## 2018-03-03 MED ORDER — METOPROLOL TARTRATE 25 MG PO TABS
25.0000 mg | ORAL_TABLET | Freq: Two times a day (BID) | ORAL | Status: DC
Start: 2018-03-03 — End: 2018-03-04
  Administered 2018-03-03 – 2018-03-04 (×3): 25 mg via ORAL
  Filled 2018-03-03 (×3): qty 1

## 2018-03-03 MED ORDER — TRAMADOL HCL 50 MG PO TABS: 50.0000 mg | ORAL_TABLET | Freq: Four times a day (QID) | ORAL | 0 refills | Status: DC | PRN

## 2018-03-03 NOTE — Progress Notes (Signed)
Informed Dr. Denton Brick that patients was having 5-7 beats runs of SVT with no symptoms. Md to place orders.

## 2018-03-03 NOTE — Progress Notes (Signed)
[address] [place], [place] [address] Phone: [phone]  FAX: [phone]      Nancy Webster [id number] Mar 16, 1928  CARE TEAM:  PCP: Lajean Manes, MD  Outpatient Care Team: Patient Care Team: Lajean Manes, MD as PCP - General (Internal Medicine)  Inpatient Treatment Team: Treatment Team: Attending Provider: Roxan Hockey, MD; Consulting Physician: Edison Pace, Md, MD; Rounding Team: Sharyon Medicus, MD; Registered Nurse: Jackson, Niger N, RN   Problem List:   Principal Problem:   Acute cholecystitis Active Problems:   AKI (acute kidney injury) [facility])   Essential hypertension   Paroxysmal SVT (supraventricular tachycardia) ([place])   Diabetes mellitus ([place])   Trigeminal neuralgia   Generalized anxiety disorder   1 Day Post-Op  Laparoscopic Cholecystectomy  Procedure Note  Indications: This patient presents with symptomatic gallbladder disease and will undergo laparoscopic cholecystectomy.  Pre-operative Diagnosis: Calculus of gallbladder with acute cholecystitis, without mention of obstruction  Post-operative Diagnosis: purulent calculous cholecystitis  Surgeon: Greer Pickerel MD FACS    Assessment  Acute cholecystitis s/p lap chole - stable  Plan:  -OK to d/c from surgery standpoint later today vs AM - call w ?s -F/u [facility] clinic [facility] in 2-3 weeks -IV Abx - ceftriaxone - no need to continue beyond today, 5/5 -HTN - PRN.  Hold lasix for now -HA - Excedrin OK -VTE prophylaxis- SCDs, etc -mobilize as tolerated to help recovery  25 minutes spent in review, evaluation, examination, counseling, and coordination of care.  More than 50% of that time was spent in counseling.  Adin Hector, M.D., F.A.C.S. Gastrointestinal and Minimally Invasive Surgery [facility] [address], [place], [place] [address] [phone] Main /  Paging   03/03/2018    Subjective: (Chief complaint)  Feeling better tol PO Hoping to go home tomorrow  Objective:  Vital signs:  Vitals:   03/02/18 1500 03/02/18 1556 03/02/18 2110 03/03/18 0426  BP: 131/86 (!) 138/47 139/64 139/71  Pulse: 78 67 89 (!) 47  Resp: 15 20 18 18   Temp: 97.6 F (36.4 C) (!) 97.5 F (36.4 C) 97.8 F (36.6 C) 98.3 F (36.8 C)  TempSrc: Oral Oral Oral Oral  SpO2: 100% 100% 99% 98%  Weight:      Height:        Last BM Date: 03/01/18  Intake/Output   Yesterday:  05/04 0701 - 05/05 0700 In: 1805 [P.O.:840; I.V.:865; IV Piggyback:100] Out: 5 [Blood:5] This shift:  No intake/output data recorded.  Bowel function:  Flatus: YES  BM:  No  Drain: (No drain)   Physical Exam:  General: Pt awake/alert/oriented x4 in no acute distress Eyes: PERRL, normal EOM.  Sclera clear.  No icterus Neuro: CN II-XII intact w/o focal sensory/motor deficits. Lymph: No head/neck/groin lymphadenopathy Psych:  No delerium/psychosis/paranoia HENT: Normocephalic, Mucus membranes moist.  No thrush Neck: Supple, No tracheal deviation Chest: No chest wall pain w good excursion CV:  Pulses intact.  Regular rhythm MS: Normal AROM mjr joints.  No obvious deformity  Abdomen: Soft.  Nondistended.  Mildly tender at incisions only.   No evidence of peritonitis.  No incarcerated hernias.  Ext:  No deformity.  No mjr edema.  No cyanosis Skin: No petechiae / purpura  Results:   Labs: Results for orders placed or performed during the hospital encounter of 03/01/18 (from the past 48 hour(s))  CBC with Differential     Status: Abnormal   Collection Time: 03/01/18  4:56 PM  Result Value Ref Range   WBC 19.1 (H) 4.0 - 10.5 K/uL   RBC 3.73 (L) 3.87 - 5.11 MIL/uL   Hemoglobin 11.6 (L) 12.0 - 15.0 g/dL   HCT 35.4 (L) 36.0 - 46.0 %   MCV 94.9 78.0 - 100.0 fL   MCH 31.1 26.0 - 34.0 pg   MCHC 32.8 30.0 - 36.0 g/dL   RDW 13.2 11.5 - 15.5 %   Platelets 284 150 - 400  K/uL   Neutrophils Relative % 79 %   Neutro Abs 15.0 (H) 1.7 - 7.7 K/uL   Lymphocytes Relative 16 %   Lymphs Abs 3.1 0.7 - 4.0 K/uL   Monocytes Relative 5 %   Monocytes Absolute 0.9 0.1 - 1.0 K/uL   Eosinophils Relative 0 %   Eosinophils Absolute 0.0 0.0 - 0.7 K/uL   Basophils Relative 0 %   Basophils Absolute 0.0 0.0 - 0.1 K/uL    Comment: Performed at [facility], [address] [place], [place] [address]  Comprehensive metabolic panel     Status: Abnormal   Collection Time: 03/01/18  4:56 PM  Result Value Ref Range   Sodium 137 135 - 145 mmol/L   Potassium 3.7 3.5 - 5.1 mmol/L   Chloride 98 (L) 101 - 111 mmol/L   CO2 28 22 - 32 mmol/L   Glucose, Bld 199 (H) 65 - 99 mg/dL   BUN 25 (H) 6 - 20 mg/dL   Creatinine, Ser 1.51 (H) 0.44 - 1.00 mg/dL   Calcium 9.4 8.9 - 10.3 mg/dL   Total Protein 7.5 6.5 - 8.1 g/dL   Albumin 3.6 3.5 - 5.0 g/dL   AST 57 (H) 15 - 41 U/L   ALT 65 (H) 14 - 54 U/L   Alkaline Phosphatase 56 38 - 126 U/L   Total Bilirubin 1.0 0.3 - 1.2 mg/dL   GFR calc non Af Amer 30 (L) >60 mL/min   GFR calc Af Amer 35 (L) >60 mL/min    Comment: (NOTE) The eGFR has been calculated using the CKD EPI equation. This calculation has not been validated in all clinical situations. eGFR's persistently <60 mL/min signify possible Chronic Kidney Disease.    Anion gap 11 5 - 15    Comment: Performed at [facility] [address], [place], [place] [address]  Lipase, blood     Status: None   Collection Time: 03/01/18  4:56 PM  Result Value Ref Range   Lipase 28 11 - 51 U/L    Comment: Performed at [facility] [address], [place], [place] [address]  I-stat troponin, ED     Status: None   Collection Time: 03/01/18  5:19 PM  Result Value Ref Range   Troponin i, poc 0.03 0.00 - 0.08 ng/mL   Comment 3            Comment: Due to the release kinetics of cTnI, a negative result within the first hours of the onset of symptoms does not rule out myocardial  infarction with certainty. If myocardial infarction is still suspected, repeat the test at appropriate intervals.   Comprehensive metabolic panel     Status: Abnormal   Collection Time: 03/02/18 12:42 AM  Result Value Ref Range   Sodium 136 135 - 145 mmol/L   Potassium 4.4 3.5 - 5.1 mmol/L   Chloride 97 (L) 101 - 111 mmol/L   CO2 29 22 - 32 mmol/L   Glucose,  Bld 167 (H) 65 - 99 mg/dL   BUN 23 (H) 6 - 20 mg/dL   Creatinine, Ser 1.26 (H) 0.44 - 1.00 mg/dL   Calcium 9.0 8.9 - 10.3 mg/dL   Total Protein 6.4 (L) 6.5 - 8.1 g/dL   Albumin 3.2 (L) 3.5 - 5.0 g/dL   AST 39 15 - 41 U/L   ALT 51 14 - 54 U/L   Alkaline Phosphatase 58 38 - 126 U/L   Total Bilirubin 0.8 0.3 - 1.2 mg/dL   GFR calc non Af Amer 37 (L) >60 mL/min   GFR calc Af Amer 43 (L) >60 mL/min    Comment: (NOTE) The eGFR has been calculated using the CKD EPI equation. This calculation has not been validated in all clinical situations. eGFR's persistently <60 mL/min signify possible Chronic Kidney Disease.    Anion gap 10 5 - 15    Comment: Performed at [facility] [address], [place], [place] [address]  CBC     Status: Abnormal   Collection Time: 03/02/18 12:42 AM  Result Value Ref Range   WBC 16.3 (H) 4.0 - 10.5 K/uL   RBC 3.63 (L) 3.87 - 5.11 MIL/uL   Hemoglobin 11.2 (L) 12.0 - 15.0 g/dL   HCT 34.5 (L) 36.0 - 46.0 %   MCV 95.0 78.0 - 100.0 fL   MCH 30.9 26.0 - 34.0 pg   MCHC 32.5 30.0 - 36.0 g/dL   RDW 13.3 11.5 - 15.5 %   Platelets 262 150 - 400 K/uL    Comment: Performed at [facility], [place] [address] [place], [place] [address]  Protime-INR     Status: Abnormal   Collection Time: 03/02/18 12:42 AM  Result Value Ref Range   Prothrombin Time 15.6 (H) 11.4 - 15.2 seconds   INR 1.25     Comment: Performed at [facility] [address] [place], [place] [address]  Hemoglobin A1c     Status: Abnormal   Collection Time: 03/02/18 12:42 AM  Result Value Ref Range   Hgb A1c MFr Bld 7.8  (H) 4.8 - 5.6 %    Comment: (NOTE) Pre diabetes:          5.7%-6.4% Diabetes:              >6.4% Glycemic control for   <7.0% adults with diabetes    Mean Plasma Glucose 177.16 mg/dL    Comment: Performed at [facility] [address] [place], [place] [address]  Type and screen [place]     Status: None   Collection Time: 03/02/18 12:48 AM  Result Value Ref Range   ABO/RH(D) O POS    Antibody Screen NEG    Sample Expiration      03/05/2018 Performed at [facility], [place] [address] [place], [place] [address]   ABO/Rh     Status: None   Collection Time: 03/02/18 12:48 AM  Result Value Ref Range   ABO/RH(D)      O POS Performed at [facility] [address] [place], [place] [address]   Glucose, capillary     Status: Abnormal   Collection Time: 03/02/18  8:18 AM  Result Value Ref Range   Glucose-Capillary 221 (H) 65 - 99 mg/dL  Glucose, capillary     Status: Abnormal   Collection Time: 03/02/18 10:17 AM  Result Value Ref Range   Glucose-Capillary 223 (H) 65 - 99 mg/dL  Glucose, capillary     Status: Abnormal   Collection Time: 03/02/18  1:43 PM  Result Value Ref Range   Glucose-Capillary 147 (H) 65 - 99 mg/dL  Glucose, capillary     Status: Abnormal   Collection Time: 03/02/18  3:20 PM  Result Value Ref Range   Glucose-Capillary 156 (H) 65 - 99 mg/dL  Glucose, capillary     Status: Abnormal   Collection Time: 03/02/18  5:35 PM  Result Value Ref Range   Glucose-Capillary 192 (H) 65 - 99 mg/dL  Glucose, capillary     Status: Abnormal   Collection Time: 03/02/18  9:58 PM  Result Value Ref Range   Glucose-Capillary 264 (H) 65 - 99 mg/dL  CBC     Status: Abnormal   Collection Time: 03/03/18  4:58 AM  Result Value Ref Range   WBC 14.4 (H) 4.0 - 10.5 K/uL   RBC 3.84 (L) 3.87 - 5.11 MIL/uL   Hemoglobin 11.9 (L) 12.0 - 15.0 g/dL   HCT 36.7 36.0 - 46.0 %   MCV 95.6 78.0 - 100.0 fL   MCH 31.0 26.0 - 34.0 pg   MCHC 32.4 30.0 -  36.0 g/dL   RDW 13.4 11.5 - 15.5 %   Platelets 270 150 - 400 K/uL    Comment: Performed at [facility], [place]. [address] [place], [place] [address]  Basic metabolic panel     Status: Abnormal   Collection Time: 03/03/18  4:58 AM  Result Value Ref Range   Sodium 135 135 - 145 mmol/L   Potassium 4.1 3.5 - 5.1 mmol/L   Chloride 99 (L) 101 - 111 mmol/L   CO2 25 22 - 32 mmol/L   Glucose, Bld 263 (H) 65 - 99 mg/dL   BUN 10 6 - 20 mg/dL   Creatinine, Ser 1.05 (H) 0.44 - 1.00 mg/dL   Calcium 8.0 (L) 8.9 - 10.3 mg/dL   GFR calc non Af Amer 46 (L) >60 mL/min   GFR calc Af Amer 54 (L) >60 mL/min    Comment: (NOTE) The eGFR has been calculated using the CKD EPI equation. This calculation has not been validated in all clinical situations. eGFR's persistently <60 mL/min signify possible Chronic Kidney Disease.    Anion gap 11 5 - 15    Comment: Performed at [facility] [address], [place], [place] [address]  Glucose, capillary     Status: Abnormal   Collection Time: 03/03/18  8:15 AM  Result Value Ref Range   Glucose-Capillary 315 (H) 65 - 99 mg/dL    Imaging / Studies: Dg Chest 2 View  Result Date: 03/01/2018 CLINICAL DATA:  Centralized chest pressure EXAM: CHEST - 2 VIEW COMPARISON:  Report 11/03/2011 FINDINGS: Atelectasis or scarring at the left base. No pleural effusion. Normal heart size. No pneumothorax. IMPRESSION: No active cardiopulmonary disease. Minimal atelectasis or scarring at the left base. Electronically Signed   By: Donavan Foil M.D.   On: 03/01/2018 19:19   Ct Abdomen Pelvis W Contrast  Result Date: 03/01/2018 CLINICAL DATA:  Abdominal pain, fever EXAM: CT ABDOMEN AND PELVIS WITH CONTRAST TECHNIQUE: Multidetector CT imaging of the abdomen and pelvis was performed using the standard protocol following bolus administration of intravenous contrast. CONTRAST:  196m OMNIPAQUE IOHEXOL 300 MG/ML  SOLN COMPARISON:  None. FINDINGS: Lower chest: Lung bases are clear.  Hepatobiliary: Liver is within normal limits. Layering small gallstones (series 3/image 30), with mild gallbladder wall thickening/pericholecystic fluid. No intrahepatic or  extrahepatic ductal dilatation. Pancreas: Within normal limits. Spleen: Within normal limits. Adrenals/Urinary Tract: Adrenal glands are within normal limits. Right kidney is within normal limits. 3.2 cm posterior interpolar left renal cyst (series 3/image 33). No hydronephrosis. Bladder is within normal limits. Stomach/Bowel: Stomach is notable for a moderate hiatal hernia. No evidence of bowel obstruction. Appendix is not discretely visualized. Sigmoid diverticulosis, without evidence of diverticulitis. Vascular/Lymphatic: No evidence of abdominal aortic aneurysm. Atherosclerotic calcifications of the abdominal aorta and branch vessels. No suspicious abdominopelvic lymphadenopathy. Reproductive: Uterus is within normal limits. Bilateral ovaries are within normal limits. Other: No abdominopelvic ascites. Musculoskeletal: Mild degenerative changes of the visualized thoracolumbar spine. IMPRESSION: Cholelithiasis with mild gallbladder wall thickening/pericholecystic fluid, raising concern for early acute cholecystitis. Consider hepatobiliary nuclear medicine scan for further evaluation, as clinically warranted. No evidence of bowel obstruction. Appendix not discretely visualized. Sigmoid diverticulosis, without evidence of diverticulitis. Electronically Signed   By: Julian Hy M.D.   On: 03/01/2018 20:32   US Abdomen Limited Ruq  Result Date: 03/01/2018 CLINICAL DATA:  84 year old female with abnormal CT appearance of the gallbladder earlier today in the setting of abdominal pain and fever. EXAM: ULTRASOUND ABDOMEN LIMITED RIGHT UPPER QUADRANT COMPARISON:  CT Abdomen and Pelvis 2013 hours on 03/01/2018. FINDINGS: Gallbladder: Echogenic gallstones are identified individually estimated at 6 millimeters diameter. There is gallbladder wall  thickening of 4-5 millimeters. Associated gallbladder wall edema or trace pericholecystic fluid. Equivocal sonographic Murphy sign (epigastric pain during scanning). Common bile duct: Diameter: 4-5 millimeters, normal. Liver: Diffusely echogenic liver parenchyma. No intrahepatic biliary ductal dilatation. No discrete liver lesion identified. Portal vein is patent on color Doppler imaging with normal direction of blood flow towards the liver. Other findings: Negative visible right kidney. IMPRESSION: 1. Highly suspicious for Acute Cholecystitis: cholelithiasis with edematous gallbladder wall but equivocal sonographic Murphy sign. 2. No evidence of CBD obstruction. 3. Fatty liver disease. Electronically Signed   By: Genevie Ann M.D.   On: 03/01/2018 23:36    Medications / Allergies: per chart  Antibiotics: Anti-infectives (From admission, onward)   Start     Dose/Rate Route Frequency Ordered Stop   03/02/18 2200  cefTRIAXone (ROCEPHIN) 2 g in sodium chloride 0.9 % 100 mL IVPB     2 g 200 mL/hr over 30 Minutes Intravenous Every 24 hours 03/01/18 2233     03/01/18 2245  cefTRIAXone (ROCEPHIN) 1 g in sodium chloride 0.9 % 100 mL IVPB     1 g 200 mL/hr over 30 Minutes Intravenous NOW 03/01/18 2233 03/01/18 2317   03/01/18 2115  cefTRIAXone (ROCEPHIN) 1 g in sodium chloride 0.9 % 100 mL IVPB     1 g 200 mL/hr over 30 Minutes Intravenous  Once 03/01/18 2100 03/01/18 2159        Note: Portions of this report may have been transcribed using voice recognition software. Every effort was made to ensure accuracy; however, inadvertent computerized transcription errors may be present.   Any transcriptional errors that result from this process are unintentional.     Adin Hector, M.D., F.A.C.S. Gastrointestinal and Minimally Invasive Surgery [facility] [address] [place], [place] [address] [phone] Main / Paging   03/03/2018

## 2018-03-03 NOTE — Progress Notes (Signed)
Patient Demographics:    Nancy Webster, is a 84 y.o. female, DOB - Jan 10, 1928, ESP:[id number]  Admit date - 03/01/2018   Admitting Physician Gwynne Edinger, MD  Outpatient Primary MD for the patient is Lajean Manes, MD  LOS - 2   Chief Complaint  Patient presents with  . Chest Pain        Subjective:    Nancy Webster today has no fevers, no emesis,  No chest pain, son and daughter-in-law at bedside, questions answered, tolerating clear liquid diet well, not passing gas yet  Assessment  & Plan :    Principal Problem:   Acute cholecystitis Active Problems:   AKI (acute kidney injury) ([place])   Essential hypertension   Paroxysmal SVT (supraventricular tachycardia) ([facility])   Diabetes mellitus ([place])   Trigeminal neuralgia   Generalized anxiety disorder   Brief Summary 84 y.o. female with medical history significant for htn, dm, trigeminal neuralgia, paroxysmal svt admitted on 03/01/2018 with abdominal pain secondary to acute calculus cholecystitis, had lap chole 03/02/18   Plan- 1)Purulent Calculous Cholecystitis-  s/p  cholecystectomy on 03/02/2018, further care per surgical team, follow LFT, diet has been advised by surgical team,  2)AKI-creatinine peaked at 1.5, creatinine is back down to 1.0, acute kidney injury was due to poor oral intake in the setting of abdominal pain and acute cholecystitis,  continue to hold benazepril and Lasix until patient is euvolemic  3)DM-last A1c 7.8, patient is starting to tolerate oral intake, Use Novolog/Humalog Sliding scale insulin with Accu-Cheks/Fingersticks as ordered    4)HTN-stable, May resume metoprolol at 25 mg twice daily,  may use IV Hydralazine 10 mg  Every 4 hours Prn for systolic blood pressure over 160 mmhg,    Code Status : DNR  Disposition Plan  : home on 03/04/18... If tolerating oral  intake and no further problems  Consults  :  Gen Surgery  DVT  Prophylaxis  :  Lovenox -  Lab Results  Component Value Date   PLT 270 03/03/2018    Inpatient Medications  Scheduled Meds: . acetaminophen  500 mg Oral Q6H  . ALPRAZolam  0.75 mg Oral QHS  . cycloSPORINE  1 drop Both Eyes BID  . enoxaparin (LOVENOX) injection  40 mg Subcutaneous Q24H  . gabapentin  100 mg Oral QHS  . insulin aspart  0-15 Units Subcutaneous TID WC  . isosorbide mononitrate  30 mg Oral Daily  . lip balm   Topical BID  . metoprolol tartrate  12.5 mg Oral BID  . pantoprazole  40 mg Oral Daily   Continuous Infusions: . cefTRIAXone (ROCEPHIN)  IV 2 g (03/02/18 2147)  . methocarbamol (ROBAXIN)  IV     PRN Meds:.alum & mag hydroxide-simeth, aspirin-acetaminophen-caffeine, bisacodyl, enalaprilat, guaiFENesin-dextromethorphan, hydrocortisone, hydrocortisone cream, magic mouthwash, menthol-cetylpyridinium, methocarbamol (ROBAXIN)  IV, metoCLOPramide (REGLAN) injection, metoprolol tartrate, morphine injection, oxyCODONE, phenol, prochlorperazine   Anti-infectives (From admission, onward)   Start     Dose/Rate Route Frequency Ordered Stop   03/02/18 2200  cefTRIAXone (ROCEPHIN) 2 g in sodium chloride 0.9 % 100 mL IVPB     2 g 200 mL/hr over 30 Minutes Intravenous Every 24 hours 03/01/18 2233 03/04/18 2159   03/01/18 2245  cefTRIAXone (ROCEPHIN) 1 g in sodium  chloride 0.9 % 100 mL IVPB     1 g 200 mL/hr over 30 Minutes Intravenous NOW 03/01/18 2233 03/01/18 2317   03/01/18 2115  cefTRIAXone (ROCEPHIN) 1 g in sodium chloride 0.9 % 100 mL IVPB     1 g 200 mL/hr over 30 Minutes Intravenous  Once 03/01/18 2100 03/01/18 2159        Objective:   Vitals:   03/02/18 1500 03/02/18 1556 03/02/18 2110 03/03/18 0426  BP: 131/86 (!) 138/47 139/64 139/71  Pulse: 78 67 89 (!) 47  Resp: 15 20 18 18   Temp: 97.6 F (36.4 C) (!) 97.5 F (36.4 C) 97.8 F (36.6 C) 98.3 F (36.8 C)  TempSrc: Oral Oral Oral Oral  SpO2: 100% 100% 99% 98%  Weight:      Height:        Wt  Readings from Last 3 Encounters:  03/01/18 73 kg (161 lb)    Intake/Output Summary (Last 24 hours) at 03/03/2018 1413 Last data filed at 03/02/2018 2147 Gross per 24 hour  Intake 1305 ml  Output -  Net 1305 ml    Physical Exam  Gen:- Awake Alert,   HEENT:- [place].AT, No sclera icterus Neck-Supple Neck,No JVD,.  Lungs-  CTAB , good air movement CV- S1, S2 normal Abd-  +ve B.Sounds, Abd Soft, appropriate postop tenderness,  Extremity/Skin:- No  edema,   Good pulses Psych-affect is appropriate, oriented x3 Neuro-no new focal deficits, no tremors   Data Review:   Micro Results No results found for this or any previous visit (from the past 240 hour(s)).  Radiology Reports Dg Chest 2 View  Result Date: 03/01/2018 CLINICAL DATA:  Centralized chest pressure EXAM: CHEST - 2 VIEW COMPARISON:  Report 11/03/2011 FINDINGS: Atelectasis or scarring at the left base. No pleural effusion. Normal heart size. No pneumothorax. IMPRESSION: No active cardiopulmonary disease. Minimal atelectasis or scarring at the left base. Electronically Signed   By: Donavan Foil M.D.   On: 03/01/2018 19:19   Ct Abdomen Pelvis W Contrast  Result Date: 03/01/2018 CLINICAL DATA:  Abdominal pain, fever EXAM: CT ABDOMEN AND PELVIS WITH CONTRAST TECHNIQUE: Multidetector CT imaging of the abdomen and pelvis was performed using the standard protocol following bolus administration of intravenous contrast. CONTRAST:  175mL OMNIPAQUE IOHEXOL 300 MG/ML  SOLN COMPARISON:  None. FINDINGS: Lower chest: Lung bases are clear. Hepatobiliary: Liver is within normal limits. Layering small gallstones (series 3/image 30), with mild gallbladder wall thickening/pericholecystic fluid. No intrahepatic or extrahepatic ductal dilatation. Pancreas: Within normal limits. Spleen: Within normal limits. Adrenals/Urinary Tract: Adrenal glands are within normal limits. Right kidney is within normal limits. 3.2 cm posterior interpolar left renal cyst (series  3/image 33). No hydronephrosis. Bladder is within normal limits. Stomach/Bowel: Stomach is notable for a moderate hiatal hernia. No evidence of bowel obstruction. Appendix is not discretely visualized. Sigmoid diverticulosis, without evidence of diverticulitis. Vascular/Lymphatic: No evidence of abdominal aortic aneurysm. Atherosclerotic calcifications of the abdominal aorta and branch vessels. No suspicious abdominopelvic lymphadenopathy. Reproductive: Uterus is within normal limits. Bilateral ovaries are within normal limits. Other: No abdominopelvic ascites. Musculoskeletal: Mild degenerative changes of the visualized thoracolumbar spine. IMPRESSION: Cholelithiasis with mild gallbladder wall thickening/pericholecystic fluid, raising concern for early acute cholecystitis. Consider hepatobiliary nuclear medicine scan for further evaluation, as clinically warranted. No evidence of bowel obstruction. Appendix not discretely visualized. Sigmoid diverticulosis, without evidence of diverticulitis. Electronically Signed   By: Julian Hy M.D.   On: 03/01/2018 20:32   US Abdomen Limited Ruq  Result Date: 03/01/2018 CLINICAL DATA:  84 year old female with abnormal CT appearance of the gallbladder earlier today in the setting of abdominal pain and fever. EXAM: ULTRASOUND ABDOMEN LIMITED RIGHT UPPER QUADRANT COMPARISON:  CT Abdomen and Pelvis 2013 hours on 03/01/2018. FINDINGS: Gallbladder: Echogenic gallstones are identified individually estimated at 6 millimeters diameter. There is gallbladder wall thickening of 4-5 millimeters. Associated gallbladder wall edema or trace pericholecystic fluid. Equivocal sonographic Murphy sign (epigastric pain during scanning). Common bile duct: Diameter: 4-5 millimeters, normal. Liver: Diffusely echogenic liver parenchyma. No intrahepatic biliary ductal dilatation. No discrete liver lesion identified. Portal vein is patent on color Doppler imaging with normal direction of blood  flow towards the liver. Other findings: Negative visible right kidney. IMPRESSION: 1. Highly suspicious for Acute Cholecystitis: cholelithiasis with edematous gallbladder wall but equivocal sonographic Murphy sign. 2. No evidence of CBD obstruction. 3. Fatty liver disease. Electronically Signed   By: Genevie Ann M.D.   On: 03/01/2018 23:36     CBC Recent Labs  Lab 03/01/18 1656 03/02/18 0042 03/03/18 0458  WBC 19.1* 16.3* 14.4*  HGB 11.6* 11.2* 11.9*  HCT 35.4* 34.5* 36.7  PLT 284 262 270  MCV 94.9 95.0 95.6  MCH 31.1 30.9 31.0  MCHC 32.8 32.5 32.4  RDW 13.2 13.3 13.4  LYMPHSABS 3.1  --   --   MONOABS 0.9  --   --   EOSABS 0.0  --   --   BASOSABS 0.0  --   --     Chemistries  Recent Labs  Lab 03/01/18 1656 03/02/18 0042 03/03/18 0458  NA 137 136 135  K 3.7 4.4 4.1  CL 98* 97* 99*  CO2 28 29 25   GLUCOSE 199* 167* 263*  BUN 25* 23* 10  CREATININE 1.51* 1.26* 1.05*  CALCIUM 9.4 9.0 8.0*  AST 57* 39  --   ALT 65* 51  --   ALKPHOS 56 58  --   BILITOT 1.0 0.8  --    ------------------------------------------------------------------------------------------------------------------ No results for input(s): CHOL, HDL, LDLCALC, TRIG, CHOLHDL, LDLDIRECT in the last 72 hours.  Lab Results  Component Value Date   HGBA1C 7.8 (H) 03/02/2018   ------------------------------------------------------------------------------------------------------------------ No results for input(s): TSH, T4TOTAL, T3FREE, THYROIDAB in the last 72 hours.  Invalid input(s): FREET3 ------------------------------------------------------------------------------------------------------------------ No results for input(s): VITAMINB12, FOLATE, FERRITIN, TIBC, IRON, RETICCTPCT in the last 72 hours.  Coagulation profile Recent Labs  Lab 03/02/18 0042  INR 1.25    No results for input(s): DDIMER in the last 72 hours.  Cardiac Enzymes No results for input(s): CKMB, TROPONINI, MYOGLOBIN in the last 168  hours.  Invalid input(s): CK ------------------------------------------------------------------------------------------------------------------ No results found for: BNP   Roxan Hockey M.D on 03/03/2018 at 2:13 PM  Between 7am to 7pm - Pager - [phone]  After 7pm go to [email or web address] - password [email or web address]  [facility] -  Office  [phone]   Voice Recognition Viviann Spare dictation system was used to create this note, attempts have been made to correct errors. Please contact the author with questions and/or clarifications.

## 2018-03-04 LAB — COMPREHENSIVE METABOLIC PANEL
ALBUMIN: 2.6 g/dL — AB (ref 3.5–5.0)
ALK PHOS: 167 U/L — AB (ref 38–126)
ALT: 125 U/L — ABNORMAL HIGH (ref 14–54)
ANION GAP: 9 (ref 5–15)
AST: 79 U/L — AB (ref 15–41)
BUN: 12 mg/dL (ref 6–20)
CALCIUM: 8.3 mg/dL — AB (ref 8.9–10.3)
CO2: 28 mmol/L (ref 22–32)
Chloride: 98 mmol/L — ABNORMAL LOW (ref 101–111)
Creatinine, Ser: 1.07 mg/dL — ABNORMAL HIGH (ref 0.44–1.00)
GFR calc Af Amer: 53 mL/min — ABNORMAL LOW (ref >60)
GFR calc non Af Amer: 45 mL/min — ABNORMAL LOW (ref >60)
GLUCOSE: 212 mg/dL — AB (ref 65–99)
Potassium: 4.5 mmol/L (ref 3.5–5.1)
SODIUM: 135 mmol/L (ref 135–145)
TOTAL PROTEIN: 6.4 g/dL — AB (ref 6.5–8.1)
Total Bilirubin: 0.6 mg/dL (ref 0.3–1.2)

## 2018-03-04 LAB — CBC
HCT: 37.5 % (ref 36.0–46.0)
Hemoglobin: 12 g/dL (ref 12.0–15.0)
MCH: 30.6 pg (ref 26.0–34.0)
MCHC: 32 g/dL (ref 30.0–36.0)
MCV: 95.7 fL (ref 78.0–100.0)
Platelets: 330 10*3/uL (ref 150–400)
RBC: 3.92 MIL/uL (ref 3.87–5.11)
RDW: 13.4 % (ref 11.5–15.5)
WBC: 15 10*3/uL — AB (ref 4.0–10.5)

## 2018-03-04 LAB — GLUCOSE, CAPILLARY
GLUCOSE-CAPILLARY: 205 mg/dL — AB (ref 65–99)
GLUCOSE-CAPILLARY: 204 mg/dL — AB (ref 65–99)
Glucose-Capillary: 188 mg/dL — ABNORMAL HIGH (ref 65–99)

## 2018-03-04 MED ORDER — ONDANSETRON 4 MG PO TBDP: 4.0000 mg | ORAL_TABLET | Freq: Three times a day (TID) | ORAL | 0 refills | Status: DC | PRN

## 2018-03-04 MED ORDER — FUROSEMIDE 20 MG PO TABS: 20.0000 mg | ORAL_TABLET | Freq: Every day | ORAL | 1 refills | Status: DC

## 2018-03-04 NOTE — Discharge Summary (Signed)
Nancy Webster, is a 84 y.o. female  DOB 1928-04-16  MRN [id number].  Admission date:  03/01/2018  Admitting Physician  Gwynne Edinger, MD  Discharge Date:  03/04/2018   Primary MD  Lajean Manes, MD  Recommendations for primary care physician for things to follow:   1) take medications as prescribed, please note changes to your Medications 2)Follow with Primary care Physician and General surgery as advised/scheduled  Admission Diagnosis  Cholecystitis [K81.9] Gallstones [K80.20]   Discharge Diagnosis  Cholecystitis [K81.9] Gallstones [K80.20]    Principal Problem:   Acute cholecystitis Active Problems:   AKI (acute kidney injury) ([place])   Essential hypertension   Paroxysmal SVT (supraventricular tachycardia) ([place])   Diabetes mellitus ([place])   Trigeminal neuralgia   Generalized anxiety disorder      Past Medical History:  Diagnosis Date  . Anxiety   . Coronary artery disease   . Diabetes mellitus without complication ([place])   . GERD (gastroesophageal reflux disease)   . Hypertension   . Renal disorder    stage 3    Past Surgical History:  Procedure Laterality Date  . CHOLECYSTECTOMY N/A 03/02/2018   Procedure: LAPAROSCOPIC CHOLECYSTECTOMY;  Surgeon: Greer Pickerel, MD;  Location: [facility];  Service: General;  Laterality: N/A;       HPI  from the history and physical done on the day of admission:     Chief Complaint: abdominal pain  HPI: Nancy Webster is a 84 y.o. female with medical history significant for htn, dm, trigeminal neuralgia, paroxysmal svt, presenting with one day of abdominal pain.  New pain. Generalized upper abdominal but worse right upper quadrant. No nausea/vomiting but appetite is decreased. Is a constant pain, worse w/ movement, not associated w/ eating. No diarrhea or constipation. No chest pain or sob. No cough or pleuritic chest discomfort. Denies recent  antibiotics. No hematuria or dysuria or suprapubic pain.  ED Course: ceftriaxone, imaging, labs, gen surg consult ([place])    Hospital Course:     Brief Summary 84 y.o.femalewith medical history significant forhtn, dm, trigeminal neuralgia, paroxysmal svt admitted on 03/01/2018 with abdominal pain secondary to acute calculus cholecystitis, had lap chole 03/02/18   Plan- 1)Purulent Calculous Cholecystitis-  s/p  cholecystectomy on 03/02/2018, further care per surgical team, follow LFT, diet has been advanced by surgical team, tolerating solid food well  2)AKI-acute kidney injury is resolved, creatinine peaked at 1.5, creatinine is back down to 1.0, acute kidney injury was due to poor oral intake in the setting of abdominal pain and acute cholecystitis,   okay to restart benazepril and Lasix   3)DM-last A1c 7.8,  okay to restart metformin  4)HTN-stable, continue metoprolol at 25 mg twice daily,    Code Status : DNR  Disposition Plan  : home on 03/04/18... she is tolerating oral  intake well and no further problems, ambulated without chest pains dizziness shortness of breath  Consults  :  Gen Surgery  Discharge Condition: stable  Follow UP  Follow-up Information  [facility], [place]. Schedule an appointment as soon as possible for a visit in 3 week(s).   Specialty:  General Surgery Why:  to follow up after your gallblader removal surgery (cholecystectomy) Contact information: [place] [phone]          Diet and Activity recommendation:  As advised  Discharge Instructions    Discharge Instructions    ([place]) Call MD:  Anytime you have any of the following symptoms: 1) 3 pound weight gain in 24 hours or 5 pounds in 1 week 2) shortness of breath, with or without a dry hacking cough 3) swelling in the hands, feet or stomach 4) if you have to sleep on extra pillows at night in  order to breathe.   Complete by:  As directed    Call MD for:   Complete by:  As directed    FEVER > 101.5 F  (temperatures < 101.5 F are not significant)   Call MD for:  difficulty breathing, headache or visual disturbances   Complete by:  As directed    Call MD for:  extreme fatigue   Complete by:  As directed    Call MD for:  extreme fatigue   Complete by:  As directed    Call MD for:  persistant dizziness or light-headedness   Complete by:  As directed    Call MD for:  persistant dizziness or light-headedness   Complete by:  As directed    Call MD for:  persistant nausea and vomiting   Complete by:  As directed    Call MD for:  persistant nausea and vomiting   Complete by:  As directed    Call MD for:  redness, tenderness, or signs of infection (pain, swelling, redness, odor or green/yellow discharge around incision site)   Complete by:  As directed    Call MD for:  severe uncontrolled pain   Complete by:  As directed    Call MD for:  severe uncontrolled pain   Complete by:  As directed    Call MD for:  temperature >100.4   Complete by:  As directed    Diet - low sodium heart healthy   Complete by:  As directed    Start with a bland diet such as soups, liquids, starchy foods, low fat foods, etc. the first few days at home. Gradually advance to a solid, low-fat, high fiber diet by the end of the first week at home.   Add a fiber supplement to your diet (Metamucil, etc) If you feel full, bloated, or constipated, stay on a full liquid or pureed/blenderized diet for a few days until you feel better and are no longer constipated.   Diet - low sodium heart healthy   Complete by:  As directed    Discharge instructions   Complete by:  As directed    See Discharge Instructions If you are not getting better after two weeks or are noticing you are getting worse, contact our office [phone] for further advice.  We may need to adjust your medications, re-evaluate you in the  office, send you to the emergency room, or see what other things we can do to help. The clinic staff is available to answer your questions during regular business hours (8:30am-5pm).  Please don't hesitate to call and ask to speak to one of our nurses for clinical concerns.    A surgeon from [facility]  is always on call at the hospitals 24 hours/day If you have a medical emergency, go to the nearest emergency room or call 911.   Discharge instructions   Complete by:  As directed    1) take medications as prescribed, please note changes to your Medications 2)Follow with Primary care Physician and General surgery as advised/scheduled   Discharge wound care:   Complete by:  As directed    It is good for closed incision and even open wounds to be washed every day.  Shower every day.  Short baths are fine.  Wash the incisions and wounds clean with soap & water.    If you have a closed incision(s), wash the incision with soap & water every day.  You may leave closed incisions open to air if it is dry.   You may cover the incision with clean gauze & replace it after your daily shower for comfort. If you have skin tapes (Steristrips) or skin glue (Dermabond) on your incision, leave them in place.  They will fall off on their own like a scab.  You may trim any edges that curl up with clean scissors.  If you have staples, set up an appointment for them to be removed in the office in 10 days after surgery.  If you have a drain, wash around the skin exit site with soap & water and place a new dressing of gauze or band aid around the skin every day.  Keep the drain site clean & dry.   Driving Restrictions   Complete by:  As directed    You may drive when: - you are no longer taking narcotic prescription pain medication - you can comfortably wear a seatbelt - you can safely make sudden turns/stops without pain.   Increase activity slowly   Complete by:  As directed    Start light daily  activities --- self-care, walking, climbing stairs- beginning the day after surgery.  Gradually increase activities as tolerated.  Control your pain to be active.  Stop when you are tired.  Ideally, walk several times a day, eventually an hour a day.   Most people are back to most day-to-day activities in a few weeks.  It takes 4-6 weeks to get back to unrestricted, intense activity. If you can walk 30 minutes without difficulty, it is safe to try more intense activity such as jogging, treadmill, bicycling, low-impact aerobics, swimming, etc. Save the most intensive and strenuous activity for last (Usually 4-8 weeks after surgery) such as sit-ups, heavy lifting, contact sports, etc.  Refrain from any intense heavy lifting or straining until you are off narcotics for pain control.  You will have off days, but things should improve week-by-week. DO NOT PUSH THROUGH PAIN.  Let pain be your guide: If it hurts to do something, don't do it.   Increase activity slowly   Complete by:  As directed    Lifting restrictions   Complete by:  As directed    If you can walk 30 minutes without difficulty, it is safe to try more intense activity such as jogging, treadmill, bicycling, low-impact aerobics, swimming, etc. Save the most intensive and strenuous activity for last (Usually 4-8 weeks after surgery) such as sit-ups, heavy lifting, contact sports, etc.   Refrain from any intense heavy lifting or straining until you are off narcotics for pain control.  You will have off days, but things should improve week-by-week. DO NOT PUSH THROUGH PAIN.  Let pain be your guide:  If it hurts to do something, don't do it.  Pain is your body warning you to avoid that activity for another week until the pain goes down.   May shower / Bathe   Complete by:  As directed    May walk up steps   Complete by:  As directed    Sexual Activity Restrictions   Complete by:  As directed    You may have sexual intercourse when it is  comfortable. If it hurts to do something, stop.        Discharge Medications     Allergies as of 03/04/2018      Reactions   Statins Other (See Comments)   "cramps"      Medication List    STOP taking these medications   ibuprofen 200 MG tablet Commonly known as:  ADVIL,MOTRIN     TAKE these medications   acetaminophen 500 MG tablet Commonly known as:  TYLENOL Take 500 mg by mouth at bedtime.   ALPRAZolam 0.5 MG tablet Commonly known as:  XANAX Take 0.5 mg by mouth 3 (three) times daily as needed for anxiety.   aspirin EC 81 MG tablet Take 81 mg by mouth daily.   benazepril 10 MG tablet Commonly known as:  LOTENSIN Take 10 mg by mouth daily.   Biotin 5000 MCG Caps Take 5,000 mcg by mouth daily.   cycloSPORINE 0.05 % ophthalmic emulsion Commonly known as:  RESTASIS Place 1 drop into both eyes 2 (two) times daily.   Fish Oil 1200 MG Caps Take 1,200 mg by mouth daily.   furosemide 20 MG tablet Commonly known as:  LASIX Take 1 tablet (20 mg total) by mouth daily. What changed:    medication strength  how much to take   gabapentin 100 MG capsule Commonly known as:  NEURONTIN Take 100 mg by mouth at bedtime.   isosorbide mononitrate 30 MG 24 hr tablet Commonly known as:  IMDUR Take 30 mg by mouth daily.   metFORMIN 1000 MG tablet Commonly known as:  GLUCOPHAGE Take 2,000 mg by mouth daily with breakfast.   metoprolol succinate 25 MG 24 hr tablet Commonly known as:  TOPROL-XL Take 25 mg by mouth daily.   multivitamin with minerals Tabs tablet Take 1 tablet by mouth daily.   ondansetron 4 MG disintegrating tablet Commonly known as:  ZOFRAN ODT Take 1 tablet (4 mg total) by mouth every 8 (eight) hours as needed for nausea or vomiting.   pantoprazole 40 MG tablet Commonly known as:  PROTONIX Take 40 mg by mouth daily.   sodium chloride 0.65 % Soln nasal spray Commonly known as:  OCEAN Place 1 spray into both nostrils as needed for congestion.    traMADol 50 MG tablet Commonly known as:  ULTRAM Take 1-2 tablets (50-100 mg total) by mouth every 6 (six) hours as needed for moderate pain or severe pain.            Discharge Care Instructions  (From admission, onward)        Start     Ordered   03/03/18 0000  Discharge wound care:    Comments:  It is good for closed incision and even open wounds to be washed every day.  Shower every day.  Short baths are fine.  Wash the incisions and wounds clean with soap & water.    If you have a closed incision(s), wash the incision with soap & water every day.  You may leave closed incisions  open to air if it is dry.   You may cover the incision with clean gauze & replace it after your daily shower for comfort. If you have skin tapes (Steristrips) or skin glue (Dermabond) on your incision, leave them in place.  They will fall off on their own like a scab.  You may trim any edges that curl up with clean scissors.  If you have staples, set up an appointment for them to be removed in the office in 10 days after surgery.  If you have a drain, wash around the skin exit site with soap & water and place a new dressing of gauze or band aid around the skin every day.  Keep the drain site clean & dry.   03/03/18 0901      Major procedures and Radiology Reports - PLEASE review detailed and final reports for all details, in brief -    Dg Chest 2 View  Result Date: 03/01/2018 CLINICAL DATA:  Centralized chest pressure EXAM: CHEST - 2 VIEW COMPARISON:  Report 11/03/2011 FINDINGS: Atelectasis or scarring at the left base. No pleural effusion. Normal heart size. No pneumothorax. IMPRESSION: No active cardiopulmonary disease. Minimal atelectasis or scarring at the left base. Electronically Signed   By: Donavan Foil M.D.   On: 03/01/2018 19:19   Ct Abdomen Pelvis W Contrast  Result Date: 03/01/2018 CLINICAL DATA:  Abdominal pain, fever EXAM: CT ABDOMEN AND PELVIS WITH CONTRAST TECHNIQUE: Multidetector CT  imaging of the abdomen and pelvis was performed using the standard protocol following bolus administration of intravenous contrast. CONTRAST:  168mL OMNIPAQUE IOHEXOL 300 MG/ML  SOLN COMPARISON:  None. FINDINGS: Lower chest: Lung bases are clear. Hepatobiliary: Liver is within normal limits. Layering small gallstones (series 3/image 30), with mild gallbladder wall thickening/pericholecystic fluid. No intrahepatic or extrahepatic ductal dilatation. Pancreas: Within normal limits. Spleen: Within normal limits. Adrenals/Urinary Tract: Adrenal glands are within normal limits. Right kidney is within normal limits. 3.2 cm posterior interpolar left renal cyst (series 3/image 33). No hydronephrosis. Bladder is within normal limits. Stomach/Bowel: Stomach is notable for a moderate hiatal hernia. No evidence of bowel obstruction. Appendix is not discretely visualized. Sigmoid diverticulosis, without evidence of diverticulitis. Vascular/Lymphatic: No evidence of abdominal aortic aneurysm. Atherosclerotic calcifications of the abdominal aorta and branch vessels. No suspicious abdominopelvic lymphadenopathy. Reproductive: Uterus is within normal limits. Bilateral ovaries are within normal limits. Other: No abdominopelvic ascites. Musculoskeletal: Mild degenerative changes of the visualized thoracolumbar spine. IMPRESSION: Cholelithiasis with mild gallbladder wall thickening/pericholecystic fluid, raising concern for early acute cholecystitis. Consider hepatobiliary nuclear medicine scan for further evaluation, as clinically warranted. No evidence of bowel obstruction. Appendix not discretely visualized. Sigmoid diverticulosis, without evidence of diverticulitis. Electronically Signed   By: Julian Hy M.D.   On: 03/01/2018 20:32   US Abdomen Limited Ruq  Result Date: 03/01/2018 CLINICAL DATA:  84 year old female with abnormal CT appearance of the gallbladder earlier today in the setting of abdominal pain and fever.  EXAM: ULTRASOUND ABDOMEN LIMITED RIGHT UPPER QUADRANT COMPARISON:  CT Abdomen and Pelvis 2013 hours on 03/01/2018. FINDINGS: Gallbladder: Echogenic gallstones are identified individually estimated at 6 millimeters diameter. There is gallbladder wall thickening of 4-5 millimeters. Associated gallbladder wall edema or trace pericholecystic fluid. Equivocal sonographic Murphy sign (epigastric pain during scanning). Common bile duct: Diameter: 4-5 millimeters, normal. Liver: Diffusely echogenic liver parenchyma. No intrahepatic biliary ductal dilatation. No discrete liver lesion identified. Portal vein is patent on color Doppler imaging with normal direction of blood flow towards the liver.  Other findings: Negative visible right kidney. IMPRESSION: 1. Highly suspicious for Acute Cholecystitis: cholelithiasis with edematous gallbladder wall but equivocal sonographic Murphy sign. 2. No evidence of CBD obstruction. 3. Fatty liver disease. Electronically Signed   By: Genevie Ann M.D.   On: 03/01/2018 23:36    Micro Results   No results found for this or any previous visit (from the past 240 hour(s)).     Today   Subjective    [place] today has no new complaints, she is tolerating oral  intake well and no further problems, ambulated without chest pains dizziness shortness of breath  No vomiting no diarrhea, family member at bedside, questions answered          Patient has been seen and examined prior to discharge   Objective   Blood pressure (!) 123/53, pulse 74, temperature 97.8 F (36.6 C), resp. rate 18, height 5' 3.5" (1.613 m), weight 74.5 kg (164 lb 4.8 oz), SpO2 92 %.   Intake/Output Summary (Last 24 hours) at 03/04/2018 1316 Last data filed at 03/04/2018 1034 Gross per 24 hour  Intake 240 ml  Output 300 ml  Net -60 ml    Exam Gen:- Awake Alert,   HEENT:- [place].AT, No sclera icterus Neck-Supple Neck,No JVD,.  Lungs-  CTAB , good air movement CV- S1, S2 normal Abd-  +ve B.Sounds, Abd  Soft, appropriate postop tenderness,  Extremity/Skin:- No  edema,   Good pulses Psych-affect is appropriate, oriented x3 Neuro-no new focal deficits, no tremors   Data Review   CBC w Diff:  Lab Results  Component Value Date   WBC 15.0 (H) 03/04/2018   HGB 12.0 03/04/2018   HCT 37.5 03/04/2018   PLT 330 03/04/2018   LYMPHOPCT 16 03/01/2018   MONOPCT 5 03/01/2018   EOSPCT 0 03/01/2018   BASOPCT 0 03/01/2018    CMP:  Lab Results  Component Value Date   NA 135 03/04/2018   K 4.5 03/04/2018   CL 98 (L) 03/04/2018   CO2 28 03/04/2018   BUN 12 03/04/2018   CREATININE 1.07 (H) 03/04/2018   PROT 6.4 (L) 03/04/2018   ALBUMIN 2.6 (L) 03/04/2018   BILITOT 0.6 03/04/2018   ALKPHOS 167 (H) 03/04/2018   AST 79 (H) 03/04/2018   ALT 125 (H) 03/04/2018  .   Total Discharge time is about 33 minutes  Roxan Hockey M.D on 03/04/2018 at 1:16 PM  [facility]   Office  [phone]  Voice Recognition Viviann Spare dictation system was used to create this note, attempts have been made to correct errors. Please contact the author with questions and/or clarifications.

## 2018-03-04 NOTE — Discharge Instructions (Signed)
1) take medications as prescribed, please note changes to your Medications 2)Follow with Primary care Physician and General surgery as advised/scheduled   LAPAROSCOPIC SURGERY: POST OP INSTRUCTIONS  ######################################################################  EAT Gradually transition to a high fiber diet with a fiber supplement over the next few weeks after discharge.  Start with a pureed / full liquid diet (see below)  WALK Walk an hour a day.  Control your pain to do that.    CONTROL PAIN Control pain so that you can walk, sleep, tolerate sneezing/coughing, go up/down stairs.  HAVE A BOWEL MOVEMENT DAILY Keep your bowels regular to avoid problems.  OK to try a laxative to override constipation.  OK to use an antidairrheal to slow down diarrhea.  Call if not better after 2 tries  CALL IF YOU HAVE PROBLEMS/CONCERNS Call if you are still struggling despite following these instructions. Call if you have concerns not answered by these instructions  ######################################################################    1. DIET: Follow a light bland diet the first 24 hours after arrival home, such as soup, liquids, crackers, etc.  Be sure to include lots of fluids daily.  Avoid fast food or heavy meals as your are more likely to get nauseated.  Eat a low fat the next few days after surgery.   2. Take your usually prescribed home medications unless otherwise directed. 3. PAIN CONTROL: a. Pain is best controlled by a usual combination of three different methods TOGETHER: i. Ice/Heat ii. Over the counter pain medication iii. Prescription pain medication b. Most patients will experience some swelling and bruising around the incisions.  Ice packs or heating pads (30-60 minutes up to 6 times a day) will help. Use ice for the first few days to help decrease swelling and bruising, then switch to heat to help relax tight/sore spots and speed recovery.  Some people prefer to use ice  alone, heat alone, alternating between ice & heat.  Experiment to what works for you.  Swelling and bruising can take several weeks to resolve.   c. It is helpful to take an over-the-counter pain medication regularly for the first few weeks.  Choose one of the following that works best for you: i. Naproxen (Aleve, etc)  Two 220mg  tabs twice a day ii. Ibuprofen (Advil, etc) Three 200mg  tabs four times a day (every meal & bedtime) iii. Acetaminophen (Tylenol, etc) 500-650mg  four times a day (every meal & bedtime) d. A  prescription for pain medication (such as oxycodone, hydrocodone, etc) should be given to you upon discharge.  Take your pain medication as prescribed.  i. If you are having problems/concerns with the prescription medicine (does not control pain, nausea, vomiting, rash, itching, etc), please call us [phone] to see if we need to switch you to a different pain medicine that will work better for you and/or control your side effect better. ii. If you need a refill on your pain medication, please contact your pharmacy.  They will contact our office to request authorization. Prescriptions will not be filled after 5 pm or on week-ends. 4. Avoid getting constipated.  Between the surgery and the pain medications, it is common to experience some constipation.  Increasing fluid intake and taking a fiber supplement (such as Metamucil, Citrucel, FiberCon, MiraLax, etc) 1-2 times a day regularly will usually help prevent this problem from occurring.  A mild laxative (prune juice, Milk of Magnesia, MiraLax, etc) should be taken according to package directions if there are no bowel movements after 48 hours.  5. Watch out for diarrhea.  If you have many loose bowel movements, simplify your diet to bland foods & liquids for a few days.  Stop any stool softeners and decrease your fiber supplement.  Switching to mild anti-diarrheal medications (Kayopectate, Pepto Bismol) can help.  If this worsens or does  not improve, please call us. 6. Wash / shower every day.  You may shower over the dressings as they are waterproof.  Continue to shower over incision(s) after the dressing is off. 7. Remove your waterproof bandages 5 days after surgery.  You may leave the incision open to air.  You may replace a dressing/Band-Aid to cover the incision for comfort if you wish.  8. ACTIVITIES as tolerated:   a. You may resume regular (light) daily activities beginning the next day--such as daily self-care, walking, climbing stairs--gradually increasing activities as tolerated.  If you can walk 30 minutes without difficulty, it is safe to try more intense activity such as jogging, treadmill, bicycling, low-impact aerobics, swimming, etc. b. Save the most intensive and strenuous activity for last such as sit-ups, heavy lifting, contact sports, etc  Refrain from any heavy lifting or straining until you are off narcotics for pain control.   c. DO NOT PUSH THROUGH PAIN.  Let pain be your guide: If it hurts to do something, don't do it.  Pain is your body warning you to avoid that activity for another week until the pain goes down. d. You may drive when you are no longer taking prescription pain medication, you can comfortably wear a seatbelt, and you can safely maneuver your car and apply brakes. e. Dennis Bast may have sexual intercourse when it is comfortable.  9. FOLLOW UP in our office a. Please call [facility] at [phone] to set up an appointment to see your surgeon in the office for a follow-up appointment approximately 2-3 weeks after your surgery. b. Make sure that you call for this appointment the day you arrive home to insure a convenient appointment time. 10. IF YOU HAVE DISABILITY OR FAMILY LEAVE FORMS, BRING THEM TO THE OFFICE FOR PROCESSING.  DO NOT GIVE THEM TO YOUR DOCTOR.   WHEN TO CALL us [phone]: 1. Poor pain control 2. Reactions / problems with new medications (rash/itching, nausea, etc)  3. Fever over  101.5 F (38.5 C) 4. Inability to urinate 5. Nausea and/or vomiting 6. Worsening swelling or bruising 7. Continued bleeding from incision. 8. Increased pain, redness, or drainage from the incision   The clinic staff is available to answer your questions during regular business hours (8:30am-5pm).  Please dont hesitate to call and ask to speak to one of our nurses for clinical concerns.   If you have a medical emergency, go to the nearest emergency room or call 911.  A surgeon from [facility] is always on call at the [facility], [place], [place], [place], [place]  [id number] ? MAIN: [phone] ? TOLL FREE: [phone] ?  FAX [phone] [email or web address]     Cholecystitis Cholecystitis is inflammation of the gallbladder. It is often called a gallbladder attack. The gallbladder is a pear-shaped organ that lies beneath the liver on the right side of the body. The gallbladder stores bile, which is a fluid that helps the body to digest fats. If bile builds up in your gallbladder, your gallbladder becomes inflamed. This condition may occur suddenly (be acute). Repeat episodes of acute cholecystitis or prolonged episodes may  lead to a long-term (chronic) condition. Cholecystitis is serious and it requires treatment. What are the causes? The most common cause of this condition is gallstones. Gallstones can block the tube (duct) that carries bile out of your gallbladder. This causes bile to build up. Other causes of this condition include:  Damage to the gallbladder due to a decrease in blood flow.  Infections in the bile ducts.  Scars or kinks in the bile ducts.  Tumors in the liver, pancreas, or gallbladder.  What increases the risk? This condition is more likely to develop in:  People who have sickle cell disease.  People who take birth control pills or use estrogen.  People who have alcoholic liver  disease.  People who have liver cirrhosis.  People who have their nutrition delivered through a vein (parenteral nutrition).  People who do not eat or drink (do fasting) for a long period of time.  People who are obese.  People who have rapid weight loss.  People who are pregnant.  People who have increased triglyceride levels.  People who have pancreatitis.  What are the signs or symptoms? Symptoms of this condition include:  Abdominal pain, especially in the upper right area of the abdomen.  Abdominal tenderness or bloating.  Nausea.  Vomiting.  Fever.  Chills.  Yellowing of the skin and the whites of the eyes (jaundice).  How is this diagnosed? This condition is diagnosed with a medical history and physical exam. You may also have other tests, including:  Imaging tests, such as: ? An ultrasound of the gallbladder. ? A CT scan of the abdomen. ? A gallbladder nuclear scan (HIDA scan). This scan allows your health care provider to see the bile moving from your liver to your gallbladder and to your small intestine. ? MRI.  Blood tests, such as: ? A complete blood count, because the white blood cell count may be higher than normal. ? Liver function tests, because some levels may be higher than normal with certain types of gallstones.  How is this treated? Treatment may include:  Fasting for a certain amount of time.  IV fluids.  Medicine to treat pain or vomiting.  Antibiotic medicine.  Surgery to remove your gallbladder (cholecystectomy). This may happen immediately or at a later time.  Follow these instructions at home: Home care will depend on your treatment. In general:  Take over-the-counter and prescription medicines only as told by your health care provider.  If you were prescribed an antibiotic medicine, take it as told by your health care provider. Do not stop taking the antibiotic even if you start to feel better.  Follow instructions from  your health care provider about what to eat or drink. When you are allowed to eat, avoid eating or drinking anything that triggers your symptoms.  Keep all follow-up visits as told by your health care provider. This is important.  Contact a health care provider if:  Your pain is not controlled with medicine.  You have a fever. Get help right away if:  Your pain moves to another part of your abdomen or to your back.  You continue to have symptoms or you develop new symptoms even with treatment. This information is not intended to replace advice given to you by your health care provider. Make sure you discuss any questions you have with your health care provider. Document Released: 10/16/2005 Document Revised: 02/24/2016 Document Reviewed: 01/27/2015 Elsevier Interactive Patient Education  2018 [employer].   1) take medications as  prescribed, please note changes to your Medications 2)Follow with Primary care Physician and General surgery as advised/scheduled

## 2018-03-12 DIAGNOSIS — R41841 Cognitive communication deficit: Secondary | ICD-10-CM | POA: Diagnosis not present

## 2018-03-12 DIAGNOSIS — R4182 Altered mental status, unspecified: Secondary | ICD-10-CM | POA: Diagnosis not present

## 2018-03-14 DIAGNOSIS — N183 Chronic kidney disease, stage 3 (moderate): Secondary | ICD-10-CM | POA: Diagnosis not present

## 2018-03-14 DIAGNOSIS — E1121 Type 2 diabetes mellitus with diabetic nephropathy: Secondary | ICD-10-CM | POA: Diagnosis not present

## 2018-03-14 DIAGNOSIS — Z7984 Long term (current) use of oral hypoglycemic drugs: Secondary | ICD-10-CM | POA: Diagnosis not present

## 2018-03-21 DIAGNOSIS — Z780 Asymptomatic menopausal state: Secondary | ICD-10-CM | POA: Diagnosis not present

## 2018-07-18 DIAGNOSIS — M1990 Unspecified osteoarthritis, unspecified site: Secondary | ICD-10-CM | POA: Diagnosis not present

## 2018-07-18 DIAGNOSIS — N183 Chronic kidney disease, stage 3 (moderate): Secondary | ICD-10-CM | POA: Diagnosis not present

## 2018-07-18 DIAGNOSIS — Z23 Encounter for immunization: Secondary | ICD-10-CM | POA: Diagnosis not present

## 2018-07-18 DIAGNOSIS — I129 Hypertensive chronic kidney disease with stage 1 through stage 4 chronic kidney disease, or unspecified chronic kidney disease: Secondary | ICD-10-CM | POA: Diagnosis not present

## 2018-07-18 DIAGNOSIS — E1121 Type 2 diabetes mellitus with diabetic nephropathy: Secondary | ICD-10-CM | POA: Diagnosis not present

## 2018-07-18 DIAGNOSIS — K219 Gastro-esophageal reflux disease without esophagitis: Secondary | ICD-10-CM | POA: Diagnosis not present

## 2018-07-18 DIAGNOSIS — G720 Drug-induced myopathy: Secondary | ICD-10-CM | POA: Diagnosis not present

## 2018-07-18 DIAGNOSIS — E7800 Pure hypercholesterolemia, unspecified: Secondary | ICD-10-CM | POA: Diagnosis not present

## 2018-08-05 ENCOUNTER — Telehealth: Payer: Self-pay | Admitting: Internal Medicine

## 2018-08-05 NOTE — Telephone Encounter (Signed)
New message:       Pt is calling and states she has not been able to get her records faxed over to [place]. She states she would like for [place] to fax Dr. Marisa Hua office at [phone]

## 2018-08-05 NOTE — Telephone Encounter (Signed)
Will route to Chart [employer] Pool to request PCP records for upcoming appointment. When I called patient to schedule her she told me her previous cardiologist had retired.  As far as I can tell her cardiology notes are in [place].

## 2018-08-13 ENCOUNTER — Ambulatory Visit (INDEPENDENT_AMBULATORY_CARE_PROVIDER_SITE_OTHER): Payer: Medicare Other | Admitting: Internal Medicine

## 2018-08-13 ENCOUNTER — Encounter: Payer: Self-pay | Admitting: Internal Medicine

## 2018-08-13 VITALS — BP 128/68 | HR 80 | Ht 63.54 in | Wt 153.8 lb

## 2018-08-13 DIAGNOSIS — I10 Essential (primary) hypertension: Secondary | ICD-10-CM | POA: Diagnosis not present

## 2018-08-13 DIAGNOSIS — I471 Supraventricular tachycardia: Secondary | ICD-10-CM

## 2018-08-13 DIAGNOSIS — E782 Mixed hyperlipidemia: Secondary | ICD-10-CM | POA: Diagnosis not present

## 2018-08-13 NOTE — Patient Instructions (Addendum)
Medication Instructions:  Your physician recommends that you continue on your current medications as directed. Please refer to the Current Medication list given to you today.  If you need a refill on your cardiac medications before your next appointment, please call your pharmacy.   Lab work: none If you have labs (blood work) drawn today and your tests are completely normal, you will receive your results only by: Marland Kitchen MyChart Message (if you have MyChart) OR . A paper copy in the mail If you have any lab test that is abnormal or we need to change your treatment, we will call you to review the results.  Testing/Procedures: none  Follow-Up: At [facility], you and your health needs are our priority.  As part of our continuing mission to provide you with exceptional heart care, we have created designated Provider Care Teams.  These Care Teams include your primary Cardiologist (physician) and Advanced Practice Providers (APPs -  Physician Assistants and Nurse Practitioners) who all work together to provide you with the care you need, when you need it. You will need a follow up appointment in:  12 months.  Please call our office 2 months in advance to schedule this appointment.  You may see Dorris Carnes, MD or one of the following Advanced Practice Providers on your designated Care Team: Richardson Dopp, PA-C [place], [place] . Daune Perch, NP  Any Other Special Instructions Will Be Listed Below (If Applicable). none

## 2018-08-13 NOTE — Progress Notes (Addendum)
Cardiology Office Note   Date:  08/13/2018   ID:  JUPITER BOYS, DOB May 28, 1928, MRN [id number]  PCP:  Lajean Manes, MD  Cardiologist:   Dorris Carnes, MD   Pt is self referred for continued cardiac care     History of Present Illness: Nancy Webster is a 84 y.o. female with a history of SVT, DM (she says dx around age 33 yo), HTN, HL, GERD, CKD.   She aw previously followed in [place]   Moved to [place]  She is followed by [employer]  The pt denies CP   Breathing is OK     No dizziness  Some wobbling    No PND  Rare skp in heart beat           Current Meds  Medication Sig  . acetaminophen (TYLENOL) 500 MG tablet Take 500 mg by mouth at bedtime.  . ALPRAZolam (XANAX) 0.5 MG tablet Take 0.5 mg by mouth at bedtime as needed for anxiety.  Marland Kitchen aspirin EC 81 MG tablet Take 81 mg by mouth daily as needed.  . benazepril (LOTENSIN) 10 MG tablet Take 10 mg by mouth daily.  . Biotin 5000 MCG CAPS Take 5,000 mcg by mouth daily.  . cycloSPORINE (RESTASIS) 0.05 % ophthalmic emulsion Place 1 drop into both eyes 2 (two) times daily.  . furosemide (LASIX) 40 MG tablet Take 40 mg by mouth 2 (two) times daily as needed.  . gabapentin (NEURONTIN) 100 MG capsule Take 100 mg by mouth at bedtime.  . isosorbide mononitrate (IMDUR) 30 MG 24 hr tablet Take 30 mg by mouth daily.  . metFORMIN (GLUCOPHAGE) 1000 MG tablet Take 2,000 mg by mouth daily with breakfast.  . metoprolol succinate (TOPROL-XL) 25 MG 24 hr tablet Take 25 mg by mouth daily.  . Multiple Vitamin (MULTIVITAMIN WITH MINERALS) TABS tablet Take 1 tablet by mouth daily.  . Omega-3 Fatty Acids (FISH OIL) 1200 MG CAPS Take 1,200 mg by mouth daily.  . ondansetron (ZOFRAN ODT) 4 MG disintegrating tablet Take 1 tablet (4 mg total) by mouth every 8 (eight) hours as needed for nausea or vomiting.  . pantoprazole (PROTONIX) 40 MG tablet Take 40 mg by mouth daily.  . sodium chloride (OCEAN) 0.65 % SOLN nasal spray Place 1 spray into both nostrils as needed  for congestion.  . traMADol (ULTRAM) 50 MG tablet Take 1-2 tablets (50-100 mg total) by mouth every 6 (six) hours as needed for moderate pain or severe pain.     Allergies:   Atorvastatin; Other; and Statins   Past Medical History:  Diagnosis Date  . Anxiety   . Coronary artery disease   . Diabetes mellitus without complication ([place])   . GERD (gastroesophageal reflux disease)   . Hypertension   . Renal disorder    stage 3    Past Surgical History:  Procedure Laterality Date  . CHOLECYSTECTOMY N/A 03/02/2018   Procedure: LAPAROSCOPIC CHOLECYSTECTOMY;  Surgeon: Greer Pickerel, MD;  Location: [facility];  Service: General;  Laterality: N/A;     Social History:  The patient  reports that she has never smoked. She has never used smokeless tobacco. She reports that she drank alcohol. She reports that she does not use drugs.   Family History:  The patient's family history includes Heart failure in her mother.    ROS:  Please see the history of present illness. All other systems are reviewed and  Negative to the above problem except as noted.  PHYSICAL EXAM: VS:  BP 128/68   Pulse 80   Ht 5' 3.54" (1.614 m)   Wt 153 lb 12.8 oz (69.8 kg)   SpO2 94%   BMI 26.78 kg/m   GEN: Well nourished, well developed, in no acute distress  HEENT: normal  Neck: no JVD, carotid bruits, or masses Cardiac: RRR; no murmurs, rubs, or gallops,no edema  Respiratory:  clear to auscultation bilaterally, normal work of breathing GI: soft, nontender, nondistended, + BS  No hepatomegaly  MS: no deformity Moving all extremities   Skin: warm and dry, no rash Neuro:  Strength and sensation are intact Psych: euthymic mood, full affect   EKG:  EKG is ordered today.   Lipid Panel No results found for: CHOL, TRIG, HDL, CHOLHDL, VLDL, LDLCALC, LDLDIRECT    Wt Readings from Last 3 Encounters:  08/13/18 153 lb 12.8 oz (69.8 kg)  03/04/18 164 lb 4.8 oz (74.5 kg)      ASSESSMENT AND PLAN:  1  Hx of SVT  Denies palpitations  2  HTN   Well controlled    3  DM   A1C 8  Discussed diet  4  HL  Did not tolerate statin   (Increased CK)       Current medicines are reviewed at length with the patient today.  The patient does not have concerns regarding medicines.  Signed, Dorris Carnes, MD  08/13/2018 10:36 AM    [address] [place], [place], [place]  [address] Phone: [phone]; Fax: [phone]

## 2018-09-19 DIAGNOSIS — H6981 Other specified disorders of Eustachian tube, right ear: Secondary | ICD-10-CM | POA: Diagnosis not present

## 2018-09-19 DIAGNOSIS — I129 Hypertensive chronic kidney disease with stage 1 through stage 4 chronic kidney disease, or unspecified chronic kidney disease: Secondary | ICD-10-CM | POA: Diagnosis not present

## 2018-09-19 DIAGNOSIS — K219 Gastro-esophageal reflux disease without esophagitis: Secondary | ICD-10-CM | POA: Diagnosis not present

## 2018-09-19 DIAGNOSIS — R197 Diarrhea, unspecified: Secondary | ICD-10-CM | POA: Diagnosis not present

## 2018-09-19 DIAGNOSIS — E1121 Type 2 diabetes mellitus with diabetic nephropathy: Secondary | ICD-10-CM | POA: Diagnosis not present

## 2018-09-19 DIAGNOSIS — N183 Chronic kidney disease, stage 3 (moderate): Secondary | ICD-10-CM | POA: Diagnosis not present

## 2018-10-29 DIAGNOSIS — J0100 Acute maxillary sinusitis, unspecified: Secondary | ICD-10-CM | POA: Diagnosis not present

## 2018-11-05 ENCOUNTER — Other Ambulatory Visit: Payer: Self-pay | Admitting: Internal Medicine

## 2018-11-05 DIAGNOSIS — R42 Dizziness and giddiness: Secondary | ICD-10-CM

## 2018-11-05 DIAGNOSIS — M62838 Other muscle spasm: Secondary | ICD-10-CM | POA: Diagnosis not present

## 2018-11-05 DIAGNOSIS — J323 Chronic sphenoidal sinusitis: Secondary | ICD-10-CM | POA: Diagnosis not present

## 2018-11-08 ENCOUNTER — Ambulatory Visit
Admission: RE | Admit: 2018-11-08 | Discharge: 2018-11-08 | Disposition: A | Discharge status: Home / Self Care | Payer: Medicare Other | Source: Ambulatory Visit | Attending: Internal Medicine | Admitting: Internal Medicine

## 2018-11-08 DIAGNOSIS — I6523 Occlusion and stenosis of bilateral carotid arteries: Secondary | ICD-10-CM | POA: Diagnosis not present

## 2018-11-08 DIAGNOSIS — R42 Dizziness and giddiness: Secondary | ICD-10-CM

## 2018-12-18 DIAGNOSIS — L821 Other seborrheic keratosis: Secondary | ICD-10-CM | POA: Diagnosis not present

## 2018-12-18 DIAGNOSIS — D225 Melanocytic nevi of trunk: Secondary | ICD-10-CM | POA: Diagnosis not present

## 2018-12-18 DIAGNOSIS — L814 Other melanin hyperpigmentation: Secondary | ICD-10-CM | POA: Diagnosis not present

## 2018-12-18 DIAGNOSIS — D171 Benign lipomatous neoplasm of skin and subcutaneous tissue of trunk: Secondary | ICD-10-CM | POA: Diagnosis not present

## 2018-12-18 DIAGNOSIS — L570 Actinic keratosis: Secondary | ICD-10-CM | POA: Diagnosis not present

## 2019-03-18 DIAGNOSIS — Z961 Presence of intraocular lens: Secondary | ICD-10-CM | POA: Diagnosis not present

## 2019-03-18 DIAGNOSIS — H5201 Hypermetropia, right eye: Secondary | ICD-10-CM | POA: Diagnosis not present

## 2019-03-18 DIAGNOSIS — E113291 Type 2 diabetes mellitus with mild nonproliferative diabetic retinopathy without macular edema, right eye: Secondary | ICD-10-CM | POA: Diagnosis not present

## 2019-03-18 DIAGNOSIS — H35373 Puckering of macula, bilateral: Secondary | ICD-10-CM | POA: Diagnosis not present

## 2019-04-10 DIAGNOSIS — K219 Gastro-esophageal reflux disease without esophagitis: Secondary | ICD-10-CM | POA: Diagnosis not present

## 2019-04-10 DIAGNOSIS — I129 Hypertensive chronic kidney disease with stage 1 through stage 4 chronic kidney disease, or unspecified chronic kidney disease: Secondary | ICD-10-CM | POA: Diagnosis not present

## 2019-04-10 DIAGNOSIS — E7800 Pure hypercholesterolemia, unspecified: Secondary | ICD-10-CM | POA: Diagnosis not present

## 2019-04-10 DIAGNOSIS — E1121 Type 2 diabetes mellitus with diabetic nephropathy: Secondary | ICD-10-CM | POA: Diagnosis not present

## 2019-04-10 DIAGNOSIS — Z7984 Long term (current) use of oral hypoglycemic drugs: Secondary | ICD-10-CM | POA: Diagnosis not present

## 2019-04-10 DIAGNOSIS — N183 Chronic kidney disease, stage 3 (moderate): Secondary | ICD-10-CM | POA: Diagnosis not present

## 2019-04-10 DIAGNOSIS — Z1331 Encounter for screening for depression: Secondary | ICD-10-CM | POA: Diagnosis not present

## 2019-04-10 DIAGNOSIS — H6981 Other specified disorders of Eustachian tube, right ear: Secondary | ICD-10-CM | POA: Diagnosis not present

## 2019-04-10 DIAGNOSIS — E11319 Type 2 diabetes mellitus with unspecified diabetic retinopathy without macular edema: Secondary | ICD-10-CM | POA: Diagnosis not present

## 2019-04-10 DIAGNOSIS — Z0000 Encounter for general adult medical examination without abnormal findings: Secondary | ICD-10-CM | POA: Diagnosis not present

## 2019-04-10 DIAGNOSIS — Z79899 Other long term (current) drug therapy: Secondary | ICD-10-CM | POA: Diagnosis not present

## 2019-04-10 DIAGNOSIS — Z1389 Encounter for screening for other disorder: Secondary | ICD-10-CM | POA: Diagnosis not present

## 2019-05-01 DIAGNOSIS — E1121 Type 2 diabetes mellitus with diabetic nephropathy: Secondary | ICD-10-CM | POA: Diagnosis not present

## 2019-05-01 DIAGNOSIS — R740 Nonspecific elevation of levels of transaminase and lactic acid dehydrogenase [LDH]: Secondary | ICD-10-CM | POA: Diagnosis not present

## 2019-06-24 ENCOUNTER — Inpatient Hospital Stay (HOSPITAL_COMMUNITY)
Admission: EM | Admit: 2019-06-24 | Discharge: 2019-06-28 | DRG: 439 | Disposition: A | Discharge status: Home / Self Care | Payer: Medicare Other | Attending: Student | Admitting: Student

## 2019-06-24 ENCOUNTER — Telehealth: Payer: Self-pay | Admitting: Internal Medicine

## 2019-06-24 ENCOUNTER — Encounter (HOSPITAL_COMMUNITY): Payer: Self-pay | Admitting: Emergency Medicine

## 2019-06-24 ENCOUNTER — Other Ambulatory Visit: Payer: Self-pay

## 2019-06-24 ENCOUNTER — Emergency Department (HOSPITAL_COMMUNITY): Payer: Medicare Other

## 2019-06-24 DIAGNOSIS — Z20828 Contact with and (suspected) exposure to other viral communicable diseases: Secondary | ICD-10-CM | POA: Diagnosis not present

## 2019-06-24 DIAGNOSIS — K858 Other acute pancreatitis without necrosis or infection: Secondary | ICD-10-CM | POA: Diagnosis not present

## 2019-06-24 DIAGNOSIS — Z888 Allergy status to other drugs, medicaments and biological substances status: Secondary | ICD-10-CM | POA: Diagnosis not present

## 2019-06-24 DIAGNOSIS — K760 Fatty (change of) liver, not elsewhere classified: Secondary | ICD-10-CM | POA: Diagnosis not present

## 2019-06-24 DIAGNOSIS — K851 Biliary acute pancreatitis without necrosis or infection: Principal | ICD-10-CM | POA: Diagnosis present

## 2019-06-24 DIAGNOSIS — K838 Other specified diseases of biliary tract: Secondary | ICD-10-CM | POA: Diagnosis present

## 2019-06-24 DIAGNOSIS — N183 Chronic kidney disease, stage 3 (moderate): Secondary | ICD-10-CM | POA: Diagnosis not present

## 2019-06-24 DIAGNOSIS — Z794 Long term (current) use of insulin: Secondary | ICD-10-CM | POA: Diagnosis not present

## 2019-06-24 DIAGNOSIS — F411 Generalized anxiety disorder: Secondary | ICD-10-CM | POA: Diagnosis present

## 2019-06-24 DIAGNOSIS — I129 Hypertensive chronic kidney disease with stage 1 through stage 4 chronic kidney disease, or unspecified chronic kidney disease: Secondary | ICD-10-CM | POA: Diagnosis present

## 2019-06-24 DIAGNOSIS — I2510 Atherosclerotic heart disease of native coronary artery without angina pectoris: Secondary | ICD-10-CM | POA: Diagnosis present

## 2019-06-24 DIAGNOSIS — E1165 Type 2 diabetes mellitus with hyperglycemia: Secondary | ICD-10-CM | POA: Diagnosis not present

## 2019-06-24 DIAGNOSIS — E1122 Type 2 diabetes mellitus with diabetic chronic kidney disease: Secondary | ICD-10-CM | POA: Diagnosis present

## 2019-06-24 DIAGNOSIS — R748 Abnormal levels of other serum enzymes: Secondary | ICD-10-CM | POA: Diagnosis not present

## 2019-06-24 DIAGNOSIS — E869 Volume depletion, unspecified: Secondary | ICD-10-CM | POA: Diagnosis present

## 2019-06-24 DIAGNOSIS — B9620 Unspecified Escherichia coli [E. coli] as the cause of diseases classified elsewhere: Secondary | ICD-10-CM | POA: Diagnosis present

## 2019-06-24 DIAGNOSIS — R1010 Upper abdominal pain, unspecified: Secondary | ICD-10-CM

## 2019-06-24 DIAGNOSIS — D72825 Bandemia: Secondary | ICD-10-CM | POA: Diagnosis not present

## 2019-06-24 DIAGNOSIS — N281 Cyst of kidney, acquired: Secondary | ICD-10-CM | POA: Diagnosis not present

## 2019-06-24 DIAGNOSIS — E0865 Diabetes mellitus due to underlying condition with hyperglycemia: Secondary | ICD-10-CM | POA: Diagnosis not present

## 2019-06-24 DIAGNOSIS — R7881 Bacteremia: Secondary | ICD-10-CM | POA: Diagnosis present

## 2019-06-24 DIAGNOSIS — R109 Unspecified abdominal pain: Secondary | ICD-10-CM | POA: Diagnosis present

## 2019-06-24 DIAGNOSIS — Z8249 Family history of ischemic heart disease and other diseases of the circulatory system: Secondary | ICD-10-CM

## 2019-06-24 DIAGNOSIS — D72829 Elevated white blood cell count, unspecified: Secondary | ICD-10-CM | POA: Diagnosis not present

## 2019-06-24 DIAGNOSIS — K859 Acute pancreatitis without necrosis or infection, unspecified: Secondary | ICD-10-CM | POA: Diagnosis not present

## 2019-06-24 DIAGNOSIS — K8050 Calculus of bile duct without cholangitis or cholecystitis without obstruction: Secondary | ICD-10-CM | POA: Diagnosis not present

## 2019-06-24 DIAGNOSIS — J9811 Atelectasis: Secondary | ICD-10-CM | POA: Diagnosis not present

## 2019-06-24 DIAGNOSIS — K219 Gastro-esophageal reflux disease without esophagitis: Secondary | ICD-10-CM | POA: Diagnosis present

## 2019-06-24 DIAGNOSIS — R778 Other specified abnormalities of plasma proteins: Secondary | ICD-10-CM

## 2019-06-24 DIAGNOSIS — I10 Essential (primary) hypertension: Secondary | ICD-10-CM | POA: Diagnosis not present

## 2019-06-24 DIAGNOSIS — Z7982 Long term (current) use of aspirin: Secondary | ICD-10-CM | POA: Diagnosis not present

## 2019-06-24 DIAGNOSIS — Z9049 Acquired absence of other specified parts of digestive tract: Secondary | ICD-10-CM

## 2019-06-24 DIAGNOSIS — E119 Type 2 diabetes mellitus without complications: Secondary | ICD-10-CM | POA: Diagnosis not present

## 2019-06-24 DIAGNOSIS — R7401 Elevation of levels of liver transaminase levels: Secondary | ICD-10-CM

## 2019-06-24 DIAGNOSIS — R740 Nonspecific elevation of levels of transaminase and lactic acid dehydrogenase [LDH]: Secondary | ICD-10-CM | POA: Diagnosis not present

## 2019-06-24 DIAGNOSIS — Z79899 Other long term (current) drug therapy: Secondary | ICD-10-CM | POA: Diagnosis not present

## 2019-06-24 DIAGNOSIS — R932 Abnormal findings on diagnostic imaging of liver and biliary tract: Secondary | ICD-10-CM | POA: Diagnosis not present

## 2019-06-24 LAB — CBC
HCT: 40.3 % (ref 36.0–46.0)
HCT: 37.5 % (ref 36.0–46.0)
Hemoglobin: 12.9 g/dL (ref 12.0–15.0)
Hemoglobin: 12.9 g/dL (ref 12.0–15.0)
MCH: 31.6 pg (ref 26.0–34.0)
MCH: 32.3 pg (ref 26.0–34.0)
MCHC: 32 g/dL (ref 30.0–36.0)
MCHC: 34.4 g/dL (ref 30.0–36.0)
MCV: 98.8 fL (ref 80.0–100.0)
MCV: 94 fL (ref 80.0–100.0)
Platelets: 357 10*3/uL (ref 150–400)
Platelets: 289 10*3/uL (ref 150–400)
RBC: 4.08 MIL/uL (ref 3.87–5.11)
RBC: 3.99 MIL/uL (ref 3.87–5.11)
RDW: 13.9 % (ref 11.5–15.5)
RDW: 14.1 % (ref 11.5–15.5)
WBC: 25.5 10*3/uL — ABNORMAL HIGH (ref 4.0–10.5)
WBC: 28.5 10*3/uL — ABNORMAL HIGH (ref 4.0–10.5)
nRBC: 0 % (ref 0.0–0.2)
nRBC: 0 % (ref 0.0–0.2)

## 2019-06-24 LAB — BASIC METABOLIC PANEL
Anion gap: 14 (ref 5–15)
BUN: 21 mg/dL (ref 8–23)
CO2: 24 mmol/L (ref 22–32)
Calcium: 9.5 mg/dL (ref 8.9–10.3)
Chloride: 96 mmol/L — ABNORMAL LOW (ref 98–111)
Creatinine, Ser: 1.21 mg/dL — ABNORMAL HIGH (ref 0.44–1.00)
GFR calc Af Amer: 46 mL/min — ABNORMAL LOW (ref >60)
GFR calc non Af Amer: 40 mL/min — ABNORMAL LOW (ref >60)
Glucose, Bld: 367 mg/dL — ABNORMAL HIGH (ref 70–99)
Potassium: 4.2 mmol/L (ref 3.5–5.1)
Sodium: 134 mmol/L — ABNORMAL LOW (ref 135–145)

## 2019-06-24 LAB — HEPATIC FUNCTION PANEL
ALT: 301 U/L — ABNORMAL HIGH (ref 0–44)
AST: 290 U/L — ABNORMAL HIGH (ref 15–41)
Albumin: 3.6 g/dL (ref 3.5–5.0)
Alkaline Phosphatase: 510 U/L — ABNORMAL HIGH (ref 38–126)
Bilirubin, Direct: 4 mg/dL — ABNORMAL HIGH (ref 0.0–0.2)
Indirect Bilirubin: 2.4 mg/dL — ABNORMAL HIGH (ref 0.3–0.9)
Total Bilirubin: 6.4 mg/dL — ABNORMAL HIGH (ref 0.3–1.2)
Total Protein: 7.4 g/dL (ref 6.5–8.1)

## 2019-06-24 LAB — GLUCOSE, CAPILLARY: Glucose-Capillary: 273 mg/dL — ABNORMAL HIGH (ref 70–99)

## 2019-06-24 LAB — LIPID PANEL
Cholesterol: 193 mg/dL (ref 0–200)
HDL: 48 mg/dL (ref >40)
LDL Cholesterol: 119 mg/dL — ABNORMAL HIGH (ref 0–99)
Total CHOL/HDL Ratio: 4 RATIO
Triglycerides: 130 mg/dL (ref <150)
VLDL: 26 mg/dL (ref 0–40)

## 2019-06-24 LAB — TROPONIN I (HIGH SENSITIVITY)
Troponin I (High Sensitivity): 20 ng/L — ABNORMAL HIGH (ref <18)
Troponin I (High Sensitivity): 31 ng/L — ABNORMAL HIGH (ref <18)

## 2019-06-24 LAB — PROTIME-INR
INR: 1.3 — ABNORMAL HIGH (ref 0.8–1.2)
Prothrombin Time: 15.7 seconds — ABNORMAL HIGH (ref 11.4–15.2)

## 2019-06-24 LAB — LIPASE, BLOOD: Lipase: 344 U/L — ABNORMAL HIGH (ref 11–51)

## 2019-06-24 LAB — CREATININE, SERUM
Creatinine, Ser: 1.2 mg/dL — ABNORMAL HIGH (ref 0.44–1.00)
GFR calc Af Amer: 46 mL/min — ABNORMAL LOW (ref >60)
GFR calc non Af Amer: 40 mL/min — ABNORMAL LOW (ref >60)

## 2019-06-24 LAB — MAGNESIUM: Magnesium: 1.5 mg/dL — ABNORMAL LOW (ref 1.7–2.4)

## 2019-06-24 LAB — SARS CORONAVIRUS 2 (TAT 6-24 HRS): SARS Coronavirus 2: NEGATIVE

## 2019-06-24 LAB — HEMOGLOBIN A1C
Hgb A1c MFr Bld: 7.1 % — ABNORMAL HIGH (ref 4.8–5.6)
Mean Plasma Glucose: 157.07 mg/dL

## 2019-06-24 LAB — TSH: TSH: 1.286 u[IU]/mL (ref 0.350–4.500)

## 2019-06-24 LAB — APTT: aPTT: 35 seconds (ref 24–36)

## 2019-06-24 LAB — PHOSPHORUS: Phosphorus: 2.2 mg/dL — ABNORMAL LOW (ref 2.5–4.6)

## 2019-06-24 MED ORDER — ISOSORBIDE MONONITRATE ER 30 MG PO TB24
30.0000 mg | ORAL_TABLET | Freq: Every day | ORAL | Status: DC
  Administered 2019-06-24: 30 mg via ORAL
  Filled 2019-06-24 (×2): qty 1

## 2019-06-24 MED ORDER — BENAZEPRIL HCL 10 MG PO TABS
10.0000 mg | ORAL_TABLET | Freq: Every day | ORAL | Status: DC
  Filled 2019-06-24: qty 1

## 2019-06-24 MED ORDER — SODIUM CHLORIDE 0.9 % IV SOLN
INTRAVENOUS | Status: AC
  Administered 2019-06-24: 23:00:00 via INTRAVENOUS

## 2019-06-24 MED ORDER — METOPROLOL SUCCINATE ER 25 MG PO TB24
25.0000 mg | ORAL_TABLET | Freq: Every day | ORAL | Status: DC
  Administered 2019-06-26 – 2019-06-28 (×3): 25 mg via ORAL
  Filled 2019-06-24 (×5): qty 1

## 2019-06-24 MED ORDER — ALPRAZOLAM 0.5 MG PO TABS
0.5000 mg | ORAL_TABLET | Freq: Every evening | ORAL | Status: DC | PRN
  Administered 2019-06-25 – 2019-06-27 (×3): 0.5 mg via ORAL
  Filled 2019-06-24 (×2): qty 1

## 2019-06-24 MED ORDER — PANTOPRAZOLE SODIUM 40 MG PO TBEC
40.0000 mg | DELAYED_RELEASE_TABLET | Freq: Every day | ORAL | Status: DC
  Administered 2019-06-24 – 2019-06-28 (×5): 40 mg via ORAL
  Filled 2019-06-24 (×6): qty 1

## 2019-06-24 MED ORDER — SALINE SPRAY 0.65 % NA SOLN
1.0000 | NASAL | Status: DC | PRN
  Filled 2019-06-24: qty 44

## 2019-06-24 MED ORDER — ADULT MULTIVITAMIN W/MINERALS CH
1.0000 | ORAL_TABLET | Freq: Every day | ORAL | Status: DC
  Administered 2019-06-24 – 2019-06-28 (×5): 1 via ORAL
  Filled 2019-06-24 (×6): qty 1

## 2019-06-24 MED ORDER — ONDANSETRON HCL 4 MG/2ML IJ SOLN
4.0000 mg | Freq: Four times a day (QID) | INTRAMUSCULAR | Status: DC | PRN
  Administered 2019-06-26: 4 mg via INTRAVENOUS

## 2019-06-24 MED ORDER — INSULIN ASPART 100 UNIT/ML ~~LOC~~ SOLN
0.0000 [IU] | Freq: Three times a day (TID) | SUBCUTANEOUS | Status: DC
  Administered 2019-06-25 (×2): 3 [IU] via SUBCUTANEOUS
  Administered 2019-06-25: 7 [IU] via SUBCUTANEOUS
  Administered 2019-06-26 (×3): 3 [IU] via SUBCUTANEOUS
  Administered 2019-06-27: 06:00:00 7 [IU] via SUBCUTANEOUS

## 2019-06-24 MED ORDER — GABAPENTIN 100 MG PO CAPS
100.0000 mg | ORAL_CAPSULE | Freq: Every day | ORAL | Status: DC
  Administered 2019-06-24 – 2019-06-27 (×4): 100 mg via ORAL
  Filled 2019-06-24 (×4): qty 1

## 2019-06-24 MED ORDER — SODIUM CHLORIDE 0.9% FLUSH: 3.0000 mL | Freq: Once | INTRAVENOUS | Status: DC

## 2019-06-24 MED ORDER — IOHEXOL 300 MG/ML  SOLN
75.0000 mL | Freq: Once | INTRAMUSCULAR | Status: AC | PRN
  Administered 2019-06-24: 75 mL via INTRAVENOUS

## 2019-06-24 MED ORDER — SODIUM CHLORIDE 0.9 % IV SOLN
1.0000 g | Freq: Two times a day (BID) | INTRAVENOUS | Status: DC
  Administered 2019-06-24 – 2019-06-27 (×6): 1 g via INTRAVENOUS
  Filled 2019-06-24 (×7): qty 1

## 2019-06-24 MED ORDER — ENOXAPARIN SODIUM 40 MG/0.4ML ~~LOC~~ SOLN: 40.0000 mg | SUBCUTANEOUS | Status: DC

## 2019-06-24 MED ORDER — OXYCODONE HCL 5 MG PO TABS
5.0000 mg | ORAL_TABLET | ORAL | Status: DC | PRN
  Administered 2019-06-25 – 2019-06-26 (×2): 5 mg via ORAL
  Filled 2019-06-24 (×2): qty 1

## 2019-06-24 MED ORDER — SODIUM CHLORIDE 0.9 % IV SOLN
INTRAVENOUS | Status: DC | PRN
  Administered 2019-06-24: 1000 mL via INTRAVENOUS

## 2019-06-24 MED ORDER — HYDROMORPHONE HCL 1 MG/ML IJ SOLN
0.5000 mg | INTRAMUSCULAR | Status: DC | PRN
  Administered 2019-06-24: 0.5 mg via INTRAVENOUS
  Filled 2019-06-24: qty 0.5

## 2019-06-24 NOTE — ED Provider Notes (Addendum)
[facility] EMERGENCY DEPARTMENT Provider Note   CSN: [id number] Arrival date & time: 06/24/19  1146     History   Chief Complaint Chief Complaint  Patient presents with  . Chest Pain    HPI Nancy Webster is a 85 y.o. female.     HPI  Presents for evaluation of chest discomfort which started after she laid down to go to bed last night.  The pain persisted and she was unable to sleep because of it.  The pain was worse if she tried to move, so she laid still.  The pain was worse when she took a deep breath.  She describes the pain as feeling like she has been "beat in the chest with boards."  It persisted this morning when she called for assistance at the independent living facility where she lives.  She drank a protein shake this morning but did not eat.  Discomfort has persisted all morning and into the afternoon.  She had a similar episode that lasted 30 minutes and resolved after she drank some ginger ale, about 3 weeks ago.  No other incidents of pain similar to this.  She states that she has a history of congestive heart failure, but never had a artifact.  Denies fever, chills, nausea, vomiting, shortness of breath, leg swelling, abdominal or back pain.  There are no other known modifying factors.   Past Medical History:  Diagnosis Date  . Anxiety   . Coronary artery disease   . Diabetes mellitus without complication ([place])   . GERD (gastroesophageal reflux disease)   . Hypertension   . Renal disorder    stage 3    Patient Active Problem List   Diagnosis Date Noted  . AKI (acute kidney injury) ([place]) 03/01/2018  . Essential hypertension 03/01/2018  . Paroxysmal SVT (supraventricular tachycardia) ([place]) 03/01/2018  . Diabetes mellitus ([place]) 03/01/2018  . Acute cholecystitis 03/01/2018  . Trigeminal neuralgia 03/01/2018  . Generalized anxiety disorder 03/01/2018    Past Surgical History:  Procedure Laterality Date  . CHOLECYSTECTOMY N/A 03/02/2018   Procedure: LAPAROSCOPIC CHOLECYSTECTOMY;  Surgeon: Greer Pickerel, MD;  Location: [facility];  Service: General;  Laterality: N/A;     OB History   No obstetric history on file.      Home Medications    Prior to Admission medications   Medication Sig Start Date End Date Taking? Authorizing Provider  acetaminophen (TYLENOL) 500 MG tablet Take 500 mg by mouth at bedtime.    [provider]  ALPRAZolam Duanne Moron) 0.5 MG tablet Take 0.5 mg by mouth at bedtime as needed for anxiety.    [provider]  aspirin EC 81 MG tablet Take 81 mg by mouth daily as needed.    [provider]  benazepril (LOTENSIN) 10 MG tablet Take 10 mg by mouth daily.    [provider]  Biotin 5000 MCG CAPS Take 5,000 mcg by mouth daily.    [provider]  cycloSPORINE (RESTASIS) 0.05 % ophthalmic emulsion Place 1 drop into both eyes 2 (two) times daily.    [provider]  furosemide (LASIX) 40 MG tablet Take 40 mg by mouth 2 (two) times daily as needed.    [provider]  gabapentin (NEURONTIN) 100 MG capsule Take 100 mg by mouth at bedtime.    [provider]  isosorbide mononitrate (IMDUR) 30 MG 24 hr tablet Take 30 mg by mouth daily.    [provider]  metFORMIN (GLUCOPHAGE)  1000 MG tablet Take 2,000 mg by mouth daily with breakfast.    [provider]  metoprolol succinate (TOPROL-XL) 25 MG 24 hr tablet Take 25 mg by mouth daily.    [provider]  Multiple Vitamin (MULTIVITAMIN WITH MINERALS) TABS tablet Take 1 tablet by mouth daily.    [provider]  Omega-3 Fatty Acids (FISH OIL) 1200 MG CAPS Take 1,200 mg by mouth daily.    [provider]  ondansetron (ZOFRAN ODT) 4 MG disintegrating tablet Take 1 tablet (4 mg total) by mouth every 8 (eight) hours as needed for nausea or vomiting. 03/04/18   Roxan Hockey, MD  pantoprazole (PROTONIX) 40 MG tablet Take 40 mg by mouth daily.    [provider]  sodium chloride (OCEAN) 0.65 % SOLN nasal spray Place 1 spray into both nostrils as needed for congestion.    [provider]  traMADol (ULTRAM) 50 MG tablet Take 1-2 tablets (50-100 mg total) by mouth every 6 (six) hours as needed for moderate pain or severe pain. 03/03/18   Michael Boston, MD    Family History Family History  Problem Relation Age of Onset  . Heart failure Mother     Social History Social History   Tobacco Use  . Smoking status: Never Smoker  . Smokeless tobacco: Never Used  Substance Use Topics  . Alcohol use: Not Currently  . Drug use: Never     Allergies   Atorvastatin, Other, and Statins   Review of Systems Review of Systems  All other systems reviewed and are negative.    Physical Exam Updated Vital Signs BP (!) 129/52   Pulse (!) 101   Temp 99.5 F (37.5 C)   Resp (!) 27   SpO2 93%   Physical Exam Vitals signs and nursing note reviewed.  Constitutional:      General: She is not in acute distress.    Appearance: She is well-developed. She is not ill-appearing, toxic-appearing or diaphoretic.  HENT:     Head: Normocephalic and atraumatic.     Right Ear: External ear normal.     Left Ear: External ear normal.  Eyes:     Conjunctiva/sclera: Conjunctivae normal.     Pupils: Pupils are equal, round, and reactive to light.  Neck:     Musculoskeletal: Normal range of motion and neck supple.     Trachea: Phonation normal.  Cardiovascular:     Rate and Rhythm: Normal rate and regular rhythm.     Heart sounds: Normal heart sounds.  Pulmonary:     Effort: Pulmonary effort is normal.     Breath sounds: Normal breath sounds.  Chest:     Chest wall: Tenderness (Mild left anterior chest wall tenderness) present.  Abdominal:     General: There is no distension.     Palpations: Abdomen is soft.     Tenderness: There is no abdominal tenderness. There is no guarding.  Musculoskeletal: Normal range of motion.  Skin:     General: Skin is warm and dry.  Neurological:     Mental Status: She is alert and oriented to person, place, and time.     Cranial Nerves: No cranial nerve deficit.     Sensory: No sensory deficit.     Motor: No abnormal muscle tone.     Coordination: Coordination normal.  Psychiatric:        Mood and Affect: Mood normal.        Behavior: Behavior normal.  Thought Content: Thought content normal.        Judgment: Judgment normal.      ED Treatments / Results  Labs (all labs ordered are listed, but only abnormal results are displayed) Labs Reviewed  BASIC METABOLIC PANEL - Abnormal; Notable for the following components:      Result Value   Sodium 134 (*)    Chloride 96 (*)    Glucose, Bld 367 (*)    Creatinine, Ser 1.21 (*)    GFR calc non Af Amer 40 (*)    GFR calc Af Amer 46 (*)    All other components within normal limits  CBC - Abnormal; Notable for the following components:   WBC 25.5 (*)    All other components within normal limits  HEPATIC FUNCTION PANEL - Abnormal; Notable for the following components:   AST 290 (*)    ALT 301 (*)    Alkaline Phosphatase 510 (*)    Total Bilirubin 6.4 (*)    Bilirubin, Direct 4.0 (*)    Indirect Bilirubin 2.4 (*)    All other components within normal limits  LIPASE, BLOOD - Abnormal; Notable for the following components:   Lipase 344 (*)    All other components within normal limits  TROPONIN I (HIGH SENSITIVITY) - Abnormal; Notable for the following components:   Troponin I (High Sensitivity) 20 (*)    All other components within normal limits  TROPONIN I (HIGH SENSITIVITY) - Abnormal; Notable for the following components:   Troponin I (High Sensitivity) 31 (*)    All other components within normal limits  SARS CORONAVIRUS 2 (TAT 6-12 HRS)    EKG EKG Interpretation  Date/Time:  Tuesday June 24 2019 11:50:12 EDT Ventricular Rate:  99 PR Interval:  156 QRS Duration: 70 QT Interval:  344 QTC Calculation: 441 R  Axis:   87 Text Interpretation:  Normal sinus rhythm with sinus arrhythmia Normal ECG No significant change was found Confirmed by Daleen Bo [phone]) on 06/24/2019 4:50:27 PM   Radiology Dg Chest 2 View  Result Date: 06/24/2019 CLINICAL DATA:  Epigastric pain at began prior to going to bed last night, lasted 3 night, history coronary artery disease, hypertension, GERD, diabetes mellitus EXAM: CHEST - 2 VIEW COMPARISON:  03/01/2018 FINDINGS: Normal heart size and pulmonary vascularity. Hiatal hernia. Atherosclerotic calcifications aorta. Chronic bronchitic changes with linear scarring at RIGHT base. No acute infiltrate, pleural effusion, or pneumothorax. Bones demineralized with mild degenerative disc disease changes of the thoracic spine. IMPRESSION: Bronchitic changes with RIGHT basilar atelectasis. Electronically Signed   By: Lavonia Dana M.D.   On: 06/24/2019 12:53    Procedures .Critical Care Performed by: Daleen Bo, MD Authorized by: Daleen Bo, MD   Critical care provider statement:    Critical care time (minutes):  35   Critical care start time:  06/24/2019 4:25 PM   Critical care end time:  06/24/2019 7:30 PM   Critical care time was exclusive of:  Separately billable procedures and treating other patients   Critical care was time spent personally by me on the following activities:  Blood draw for specimens, development of treatment plan with patient or surrogate, discussions with consultants, evaluation of patient's response to treatment, examination of patient, obtaining history from patient or surrogate, ordering and performing treatments and interventions, ordering and review of laboratory studies, pulse oximetry, re-evaluation of patient's condition, review of old charts and ordering and review of radiographic studies   (including critical care time)  Medications Ordered  in ED Medications  sodium chloride flush (NS) 0.9 % injection 3 mL (has no administration in time  range)     Initial Impression / Assessment and Plan / ED Course  I have reviewed the triage vital signs and the nursing notes.  Pertinent labs & imaging results that were available during my care of the patient were reviewed by me and considered in my medical decision making (see chart for details).  Clinical Course as of 12-06-29  Tue Jun 24, 2019  1823 Mild elevation, delta 11.  Troponin I (High Sensitivity)(!) [EW]  1823 Abnormal, elevated AST, ALT, alkaline phosphatase, total bilirubin, direct bilirubin, and indirect bilirubin.  Hepatic function panel(!) [EW]  1824 Normal except white count high  CBC(!) [EW]  1824 Elevated  Lipase, blood(!) [EW]  1824 Normal except sodium low, chloride low, glucose high, creatinine high, GFR low  Basic metabolic panel(!) [EW]  [id number] Patient is comfortable at this time and denies pain.  CT imaging ordered.  She is status post cholecystectomy.   [EW]    Clinical Course User Index [EW] Daleen Bo, MD        Patient Vitals for the past 24 hrs:  BP Temp Temp src Pulse Resp SpO2  06/24/19 1800 (!) 129/52 - - (!) 101 (!) 27 93 %  06/24/19 1745 (!) 130/53 - - (!) 102 (!) 27 93 %  06/24/19 1730 (!) 137/57 - - (!) 101 (!) 24 93 %  06/24/19 1715 (!) 135/54 - - (!) 101 (!) 29 93 %  06/24/19 1700 (!) 125/49 - - 99 (!) 22 95 %  06/24/19 1645 (!) 156/59 - - 98 (!) 30 93 %  06/24/19 1546 - - - (!) 102 18 97 %  06/24/19 1336 (!) 129/93 99.5 F (37.5 C) - (!) 108 16 97 %  06/24/19 1155 (!) 134/94 99.1 F (37.3 C) Oral 96 16 97 %    7:28 PM Reevaluation with update and discussion. After initial assessment and treatment, an updated evaluation reveals patient relatively comfortable this time.  I discussed with patient and family member, daughter.  All questions answered.Daleen Bo   Medical Decision Making: Nonspecific chest pain, with mild troponin elevation delta testing.  Doubt ACS.  Suspect amatory condition, pancreatitis, contributing  to chest discomfort.  Patient of liver enzymes are concerning for obstructive pattern.  Concern for primary pancreatic disorder.  Patient does not drink alcohol and does not have a true pancreatitis.  Doubt ACS, PE or pneumonia.  She will require hospitalization for stabilization.  CT imaging ordered, to evaluate abdominal discomfort.  CRITICAL CARE-yes Performed by: Daleen Bo   Nursing Notes Reviewed/ Care Coordinated Applicable Imaging Reviewed Interpretation of Laboratory Data incorporated into ED treatment   7:31 PM-Consult complete with hospitalist. Patient case explained and discussed.  He agrees to admit patient for further evaluation and treatment. Call ended at 7:50 PM  Plan: Admit    Final Clinical Impressions(s) / ED Diagnoses   Final diagnoses:  Acute pancreatitis, unspecified complication status, unspecified pancreatitis type  Elevated troponin  Hyperbilirubinemia  Transaminitis  Leukocytosis, unspecified type    ED Discharge Orders    None       Daleen Bo, MD 06/24/19 1956    Daleen Bo, MD 06/24/19 [phone]

## 2019-06-24 NOTE — Progress Notes (Addendum)
RN called to update patient daughter, Jana Half. Patient daughter given room phone number and informed of the visitation policy.

## 2019-06-24 NOTE — Telephone Encounter (Signed)
New Message   Pt c/o of Chest Pain: STAT if CP now or developed within 24 hours  1. Are you having CP right now? Yes  2. Are you experiencing any other symptoms (ex. SOB, nausea, vomiting, sweating)? Shaky  3. How long have you been experiencing CP? Last night  4. Is your CP continuous or coming and going? Continuous  5. Have you taken Nitroglycerin? No ?   Current bp and hr: 130/80 hr 96

## 2019-06-24 NOTE — Progress Notes (Signed)
Pharmacy Antibiotic Note  Nancy Webster is a 85 y.o. female admitted on 06/24/2019 with chest pain.  Pharmacy has been consulted for meropenem dosing for r/o pancreatitis.  Tmax in ED noted to be 99.5 currently afebrile. WBC is elevated at 25.5. Scr slightly elevated at 1.21. There is concern for pancreatitis given abdominal pain and elevated liver enzymes.  Plan: Meropenem 1g q12 hours  Weight: 151 lb 3.8 oz (68.6 kg)  Temp (24hrs), Avg:99.1 F (37.3 C), Min:98.6 F (37 C), Max:99.5 F (37.5 C)  Recent Labs  Lab 06/24/19 1207  WBC 25.5*  CREATININE 1.21*    CrCl cannot be calculated (Unknown ideal weight.).    Allergies  Allergen Reactions  . Atorvastatin Other (See Comments)    UNKNOWN  . Other Other (See Comments)    Cramps to all Cholesterol medications  . Statins Other (See Comments)    "cramps" UNKNOWN   Thank you for allowing pharmacy to be a part of this patient's care.  Erin Hearing PharmD., BCPS Clinical Pharmacist 06/24/2019 9:26 PM

## 2019-06-24 NOTE — H&P (Signed)
History and Physical  Nancy Webster [id number] DOB: 1928/04/24 DOA: 06/24/2019  Referring physician: ER provider PCP: Lajean Manes, MD  Outpatient Specialists:    Patient coming from: Home  Chief Complaint: Abdominal pain.  HPI:  Patient is an 85 year old Caucasian female past medical history significant for diabetes mellitus, coronary artery disease, hypertension, chronic kidney disease stage III, acute cholecystitis status post cholecystectomy in May of last year.  Patient presents with constant, severe, upper abdominal pain that started last night.  Cording to the patient, at the pain was so severe that she was up all night.  Associated chills, no fever, no nausea or vomiting.  On presentation to the hospital, patient was found to have total bilirubin of 6.4, direct bilirubin of 4, alkaline phosphatase of 510, low lipase of 344, AST of 290 and ALT of 301.  Currently, patient's WBC was 25,500.  CT abdomen and pelvis with contrast revealed findings suspicious for non-complicated pancreatitis and intrahepatic biliary duct dilatation.  No headache, no neck pain, no chest pain, no shortness of breath and no urinary symptoms.  Hospitalist team has been asked to admit patient for further assessment and management.  ED Course: On presentation to the hospital, patient was afebrile, with heart rate of 103 bpm, respiratory rate of 20, blood pressure 156/61 mmHg and O2 sat of 97%.  Pertinent labs as documented above.  Patient be admitted for further assessment and management.  Pertinent labs: As documented above.  EKG: Independently reviewed.   Imaging: independently reviewed.   Review of Systems:  Negative for fever, visual changes, sore throat, rash, new muscle aches, chest pain, SOB, dysuria, bleeding, n/v.  Past Medical History:  Diagnosis Date  . Anxiety   . Coronary artery disease   . Diabetes mellitus without complication ([place])   . GERD (gastroesophageal reflux disease)   .  Hypertension   . Renal disorder    stage 3    Past Surgical History:  Procedure Laterality Date  . CHOLECYSTECTOMY N/A 03/02/2018   Procedure: LAPAROSCOPIC CHOLECYSTECTOMY;  Surgeon: Greer Pickerel, MD;  Location: [facility];  Service: General;  Laterality: N/A;     reports that she has never smoked. She has never used smokeless tobacco. She reports previous alcohol use. She reports that she does not use drugs.  Allergies  Allergen Reactions  . Atorvastatin Other (See Comments)    UNKNOWN  . Other Other (See Comments)    Cramps to all Cholesterol medications  . Statins Other (See Comments)    "cramps" UNKNOWN    Family History  Problem Relation Age of Onset  . Heart failure Mother      Prior to Admission medications   Medication Sig Start Date End Date Taking? Authorizing Provider  acetaminophen (TYLENOL) 500 MG tablet Take 500 mg by mouth at bedtime.   Yes [provider]  ALPRAZolam Duanne Moron) 0.5 MG tablet Take 0.5 mg by mouth at bedtime as needed for anxiety.   Yes [provider]  benazepril (LOTENSIN) 10 MG tablet Take 10 mg by mouth daily.   Yes [provider]  furosemide (LASIX) 40 MG tablet Take 20 mg by mouth daily as needed for fluid.    Yes [provider]  gabapentin (NEURONTIN) 100 MG capsule Take 100 mg by mouth at bedtime.   Yes [provider]  isosorbide mononitrate (IMDUR) 30 MG 24 hr tablet Take 30 mg by mouth daily.   Yes [provider]  metFORMIN (GLUCOPHAGE) 1000 MG tablet Take 1,000 mg  by mouth 2 (two) times daily.    Yes [provider]  metoprolol succinate (TOPROL-XL) 25 MG 24 hr tablet Take 25 mg by mouth daily.   Yes [provider]  Multiple Vitamin (MULTIVITAMIN WITH MINERALS) TABS tablet Take 1 tablet by mouth daily.   Yes [provider]  pantoprazole (PROTONIX) 40 MG tablet Take 40 mg by mouth daily.   Yes [provider]  sodium chloride (OCEAN) 0.65 % SOLN  nasal spray Place 1 spray into both nostrils as needed for congestion.   Yes [provider]    Physical Exam: Vitals:   06/24/19 1730 06/24/19 1745 06/24/19 1800 06/24/19 2044  BP: (!) 137/57 (!) 130/53 (!) 129/52 (!) 156/61  Pulse: (!) 101 (!) 102 (!) 101 (!) 103  Resp: (!) 24 (!) 27 (!) 27 20  Temp:    98.6 F (37 C)  TempSrc:    Oral  SpO2: 93% 93% 93% 97%  Weight:    68.6 kg    Constitutional:  . Patient is jaundice.  Patient is some mild discomfort.   Eyes:  . No pallor. Jaundice.  ENMT:  . external ears, nose appear normal. Dry buccal mucosa Neck:  . Neck is supple. No JVD Respiratory:  . CTA bilaterally, no w/r/r.  . Respiratory effort normal. No retractions or accessory muscle use Cardiovascular:  . S1S2 . No LE extremity edema   Abdomen:  . Abdomen is soft with tenderness upper abdominal area. Organs are difficult to assess. Neurologic:  . Awake and alert. . Moves all limbs.  Wt Readings from Last 3 Encounters:  06/24/19 68.6 kg  08/13/18 69.8 kg  03/04/18 74.5 kg    I have personally reviewed following labs and imaging studies  Labs on Admission:  CBC: Recent Labs  Lab 06/24/19 1207  WBC 25.5*  HGB 12.9  HCT 40.3  MCV 98.8  PLT [id number]   Basic Metabolic Panel: Recent Labs  Lab 06/24/19 1207  NA 134*  K 4.2  CL 96*  CO2 24  GLUCOSE 367*  BUN 21  CREATININE 1.21*  CALCIUM 9.5   Liver Function Tests: Recent Labs  Lab 06/24/19 1645  AST 290*  ALT 301*  ALKPHOS 510*  BILITOT 6.4*  PROT 7.4  ALBUMIN 3.6   Recent Labs  Lab 06/24/19 1645  LIPASE 344*   No results for input(s): AMMONIA in the last 168 hours. Coagulation Profile: No results for input(s): INR, PROTIME in the last 168 hours. Cardiac Enzymes: No results for input(s): CKTOTAL, CKMB, CKMBINDEX, TROPONINI in the last 168 hours. BNP (last 3 results) No results for input(s): PROBNP in the last 8760 hours. HbA1C: No results for input(s): HGBA1C in the last 72  hours. CBG: No results for input(s): GLUCAP in the last 168 hours. Lipid Profile: No results for input(s): CHOL, HDL, LDLCALC, TRIG, CHOLHDL, LDLDIRECT in the last 72 hours. Thyroid Function Tests: No results for input(s): TSH, T4TOTAL, FREET4, T3FREE, THYROIDAB in the last 72 hours. Anemia Panel: No results for input(s): VITAMINB12, FOLATE, FERRITIN, TIBC, IRON, RETICCTPCT in the last 72 hours. Urine analysis: No results found for: COLORURINE, APPEARANCEUR, LABSPEC, PHURINE, GLUCOSEU, HGBUR, BILIRUBINUR, KETONESUR, PROTEINUR, UROBILINOGEN, NITRITE, LEUKOCYTESUR Sepsis Labs: @LABRCNTIP (procalcitonin:4,lacticidven:4) )No results found for this or any previous visit (from the past 240 hour(s)).    Radiological Exams on Admission: Dg Chest 2 View  Result Date: 06/24/2019 CLINICAL DATA:  Epigastric pain at began prior to going to bed last night, lasted 3 night, history coronary artery  disease, hypertension, GERD, diabetes mellitus EXAM: CHEST - 2 VIEW COMPARISON:  03/01/2018 FINDINGS: Normal heart size and pulmonary vascularity. Hiatal hernia. Atherosclerotic calcifications aorta. Chronic bronchitic changes with linear scarring at RIGHT base. No acute infiltrate, pleural effusion, or pneumothorax. Bones demineralized with mild degenerative disc disease changes of the thoracic spine. IMPRESSION: Bronchitic changes with RIGHT basilar atelectasis. Electronically Signed   By: Lavonia Dana M.D.   On: 06/24/2019 12:53   Ct Abdomen Pelvis W Contrast  Result Date: 06/24/2019 CLINICAL DATA:  Epigastric abdominal pain since last night. EXAM: CT ABDOMEN AND PELVIS WITH CONTRAST TECHNIQUE: Multidetector CT imaging of the abdomen and pelvis was performed using the standard protocol following bolus administration of intravenous contrast. CONTRAST:  27mL OMNIPAQUE IOHEXOL 300 MG/ML  SOLN COMPARISON:  Abdominal ultrasound of 03/02/2018. Abdominal CT of 03/02/2018. FINDINGS: Lower chest: Subsegmental atelectasis at  the bases. Normal heart size without pericardial or pleural effusion. A moderate hiatal hernia. Hepatobiliary: Degradation secondary to patient arm position and EKG wires and leads, affecting the upper abdomen. No focal liver lesion. Cholecystectomy. Mild intrahepatic biliary duct dilatation. The common duct measures within normal limits at 8 mm in the porta hepatis on transverse imaging. Up to 9 mm on coronal image 45/6. Possible choledocholithiasis distally, including on coronal image 47. Pancreas: Suspicion of mild pancreatic enlargement for age and mild peripancreatic edema. Example adjacent the head on image 27/3. No pancreatic necrosis or duct dilatation. Spleen: Normal in size, without focal abnormality. Adrenals/Urinary Tract: Mild left adrenal thickening. Normal right adrenal gland. Interpolar left renal 2.6 cm cyst. Normal right kidney, without hydronephrosis or hydroureter. Normal urinary bladder. Stomach/Bowel: Gastric antral underdistention. Extensive colonic diverticulosis. Normal terminal ileum. Normal small bowel. Vascular/Lymphatic: Advanced aortic and branch vessel atherosclerosis. Patent portal and splenic veins. No abdominopelvic adenopathy. Reproductive: Normal uterus and adnexa. Other: No significant free fluid. Musculoskeletal: Degenerative disc disease, primarily at L3-4 and L2-3. IMPRESSION: 1. Findings suspicious for non complicated pancreatitis. 2. Cholecystectomy. Development of intrahepatic biliary duct dilatation. Although the common duct is normal in caliber for age, suspicion of distal choledocholithiasis. Correlate with bilirubin levels. 3. Moderate hiatal hernia. 4.  Aortic Atherosclerosis (ICD10-I70.0). Electronically Signed   By: Abigail Miyamoto M.D.   On: 06/24/2019 20:13    EKG: Independently reviewed.   Active Problems:   Abdominal pain   Assessment/Plan Abdominal pain, acute with elevated lipase worrisome for acute pancreatitis: Admit patient for further assessment and  management. Suspect acute pancreatitis secondary to cholelithiasis Supportive care. Adequate hydration Control pain MRI of the abdomen (MRCP) Rule out other possible intrahepatic process Further management will depend on hospital course  Chronic kidney disease stage III: Stable. Continue to monitor renal function and electrolytes.  Diabetes mellitus: Sliding scale insulin coverage for now. Further management will depend on above.  Coronary artery disease: Stable. No chest pain.  Volume depletion: Mild. Gentle hydration.  Leukocytosis: Patient has chronic leukocytosis, but worsened  Leukocytosis could be reactive.   Work-up for possible infective process as well. Panculture patient. Further management depend on hospital course.  DVT prophylaxis: Subcutaneous Lovenox  Code Status: Full code Family Communication:  Disposition Plan: This will depend on hospital course Consults called: Have a low threshold to consult GI Admission status: Inpatient  Time spent: 65 minutes  Dana Allan, MD  [facility] Pager #: [phone] 7PM-7AM contact night coverage as above  06/24/2019, 9:09 PM

## 2019-06-24 NOTE — Telephone Encounter (Signed)
I spoke to [facility] nurse) who is with the patient.  The patient is experiencing severe CP and SOB.   This started developing last night and is now continuous.  I recommended call EMS and having her transported to the ED.  Webb Silversmith verbalized understanding.

## 2019-06-24 NOTE — ED Triage Notes (Signed)
Pt reports epigastrici pain that began prior to going to bed last night and lasting all through the night pt states " it felt like someone was beating me with a stick all night.  Pt denies any sob.

## 2019-06-25 ENCOUNTER — Inpatient Hospital Stay (HOSPITAL_COMMUNITY): Payer: Medicare Other

## 2019-06-25 ENCOUNTER — Encounter (HOSPITAL_COMMUNITY): Payer: Self-pay

## 2019-06-25 DIAGNOSIS — K838 Other specified diseases of biliary tract: Secondary | ICD-10-CM

## 2019-06-25 DIAGNOSIS — R748 Abnormal levels of other serum enzymes: Secondary | ICD-10-CM

## 2019-06-25 DIAGNOSIS — D72825 Bandemia: Secondary | ICD-10-CM

## 2019-06-25 LAB — COMPREHENSIVE METABOLIC PANEL
ALT: 213 U/L — ABNORMAL HIGH (ref 0–44)
AST: 162 U/L — ABNORMAL HIGH (ref 15–41)
Albumin: 2.8 g/dL — ABNORMAL LOW (ref 3.5–5.0)
Alkaline Phosphatase: 379 U/L — ABNORMAL HIGH (ref 38–126)
Anion gap: 12 (ref 5–15)
BUN: 23 mg/dL (ref 8–23)
CO2: 25 mmol/L (ref 22–32)
Calcium: 8.8 mg/dL — ABNORMAL LOW (ref 8.9–10.3)
Chloride: 98 mmol/L (ref 98–111)
Creatinine, Ser: 1.31 mg/dL — ABNORMAL HIGH (ref 0.44–1.00)
GFR calc Af Amer: 42 mL/min — ABNORMAL LOW (ref >60)
GFR calc non Af Amer: 36 mL/min — ABNORMAL LOW (ref >60)
Glucose, Bld: 271 mg/dL — ABNORMAL HIGH (ref 70–99)
Potassium: 4.1 mmol/L (ref 3.5–5.1)
Sodium: 135 mmol/L (ref 135–145)
Total Bilirubin: 5.9 mg/dL — ABNORMAL HIGH (ref 0.3–1.2)
Total Protein: 5.9 g/dL — ABNORMAL LOW (ref 6.5–8.1)

## 2019-06-25 LAB — TSH: TSH: 1.287 u[IU]/mL (ref 0.350–4.500)

## 2019-06-25 LAB — GLUCOSE, CAPILLARY
Glucose-Capillary: 309 mg/dL — ABNORMAL HIGH (ref 70–99)
Glucose-Capillary: 226 mg/dL — ABNORMAL HIGH (ref 70–99)
Glucose-Capillary: 203 mg/dL — ABNORMAL HIGH (ref 70–99)
Glucose-Capillary: 220 mg/dL — ABNORMAL HIGH (ref 70–99)

## 2019-06-25 LAB — LIPASE, BLOOD: Lipase: 814 U/L — ABNORMAL HIGH (ref 11–51)

## 2019-06-25 MED ORDER — SODIUM CHLORIDE 0.9 % IV SOLN
INTRAVENOUS | Status: DC
  Administered 2019-06-26 (×2): via INTRAVENOUS

## 2019-06-25 MED ORDER — ENOXAPARIN SODIUM 30 MG/0.3ML ~~LOC~~ SOLN
30.0000 mg | SUBCUTANEOUS | Status: DC
  Administered 2019-06-25 – 2019-06-26 (×2): 30 mg via SUBCUTANEOUS
  Filled 2019-06-25 (×2): qty 0.3

## 2019-06-25 NOTE — Progress Notes (Signed)
Patient states she is unable to stand for standing weight at this time due to weakness.  ED RN stated patient had not been ambulatory at all in the ED.  Patient lives in independent living and able to walk.  Patient stated she recently had to pull out her walker this week to help her because she had become so weak.

## 2019-06-25 NOTE — Progress Notes (Signed)
PROGRESS NOTE  Nancy Webster [id number] DOB: 09-29-1928   PCP: Lajean Manes, MD  Patient is from: Home  DOA: 06/24/2019 LOS: 1  Brief Narrative / Interim history: 85 year old female with history of DM-2, CAD, HTN, CKD-3, cholecystectomy in 02/2018 and anxiety presenting with acute upper abdominal pain for 1 day and admitted with acute pancreatitis.   In ED, hemodynamically stable.  Liver enzymes, total bili and lipase elevated.  CT abdomen and pelvis revealed uncomplicated pancreatitis with intrahepatic biliary duct dilation suspicious for choledocholithiasis.  Patient was admitted.  MRCP was ordered.  Assessment & Plan: Acute uncomplicated pancreatitis:  -Patient presented with acute upper abdominal pain.   -CT abdomen reveals acute uncomplicated pancreatitis with intrahepatic biliary duct dilation suspicious for choledocholithiasis.  -Calcium within normal range.  No hypertriglyceridemia or lipid panel. -LFT and bilirubin elevated but improving. -Lipase trending up but patient's pain improved significantly.  She may have passed the stone already. -Follow MRCP -Continue meropenem, bowel rest, pain control, PPI and IV fluids.  Elevated liver enzymes/hyperbilirubinemia/elevated alkaline phosphatase: Likely due to the above.  Improving. -Continue trending  Leukocytosis: Patient has significant leukocytosis.  Blood pressure soft this morning.  Blood cultures negative. -Meropenem as above -Continue trending.  History of CAD: No anginal symptoms. -Continue metoprolol. -Hold benazepril and Imdur in the setting of soft blood pressure.  HTN: Normotensive for most part but soft blood pressure this morning. -IV fluids and cardiac meds as above.  NIDDM-2: On metformin at home. -CBG monitoring and sliding scale insulin -Hold statin in the setting of elevated liver enzymes.  CKD-3: Stable -Continue monitoring  Anxiety: Stable -Continue home medications.  DVT prophylaxis: Subcu  Lovenox Code Status: Full code Family Communication: Updated patient's daughter over the phone.  Expressed appreciation. Disposition Plan: Remains inpatient for acute pancreatitis and possible cholangitis. Consultants: We will consult GI after MRCP.  Procedures:  None  Microbiology summarized: [id number] negative. Blood cultures negative.  Antimicrobials: Anti-infectives (From admission, onward)   Start     Dose/Rate Route Frequency Ordered Stop   06/24/19 2200  meropenem (MERREM) 1 g in sodium chloride 0.9 % 100 mL IVPB     1 g 200 mL/hr over 30 Minutes Intravenous Every 12 hours 06/24/19 2126        Sch Meds:  Scheduled Meds: . benazepril  10 mg Oral Daily  . enoxaparin (LOVENOX) injection  30 mg Subcutaneous Q24H  . gabapentin  100 mg Oral QHS  . insulin aspart  0-9 Units Subcutaneous TID WC  . isosorbide mononitrate  30 mg Oral Daily  . metoprolol succinate  25 mg Oral Daily  . multivitamin with minerals  1 tablet Oral Daily  . pantoprazole  40 mg Oral Daily  . sodium chloride flush  3 mL Intravenous Once   Continuous Infusions: . sodium chloride 75 mL/hr at 06/24/19 2258  . sodium chloride 1,000 mL (06/24/19 2221)  . meropenem (MERREM) IV Stopped (06/24/19 2330)   PRN Meds:.sodium chloride, ALPRAZolam, HYDROmorphone (DILAUDID) injection, ondansetron, oxyCODONE, sodium chloride   Subjective: No major events overnight of this morning.  States feeling well this morning.  Reports significant improvement in her belly.  Denies chest pain, dyspnea, nausea or vomiting.  Denies GU symptoms.  Would like me to update her daughter.  Objective: Vitals:   06/25/19 0025 06/25/19 0419 06/25/19 0911 06/25/19 1059  BP: (!) 111/48 (!) 107/52 (!) 92/44 (!) 98/47  Pulse: 95 90 92 93  Resp:  18 18 17   Temp:  97.8 F (36.6  C) 98.6 F (37 C) (!) 97.5 F (36.4 C)  TempSrc:  Oral Oral Oral  SpO2: 93% 94% 98% 95%  Weight:  68.6 kg    Height:        Intake/Output Summary (Last 24  hours) at 06/25/2019 1222 Last data filed at 06/25/2019 1010 Gross per 24 hour  Intake 626.16 ml  Output 550 ml  Net 76.16 ml   Filed Weights   06/24/19 2044 06/25/19 0419  Weight: 68.6 kg 68.6 kg    Examination:  GENERAL: No acute distress.  Appears well.  HEENT: MMM.  Vision and hearing grossly intact.  NECK: Supple.  No apparent JVD.  RESP:  No IWOB. Good air movement bilaterally. CVS:  RRR. Heart sounds normal.  ABD/GI/GU: Bowel sounds present. Soft.  Mild epigastric tenderness. MSK/EXT:  Moves extremities. No apparent deformity or edema.  SKIN: no apparent skin lesion or wound NEURO: Awake, alert and oriented appropriately.  No gross deficit.  PSYCH: Calm. Normal affect.   I have personally reviewed the following labs and images: CBC: Recent Labs  Lab 06/24/19 1207 06/24/19 2207  WBC 25.5* 28.5*  HGB 12.9 12.9  HCT 40.3 37.5  MCV 98.8 94.0  PLT 357 289   BMP &GFR Recent Labs  Lab 06/24/19 1207 06/24/19 2207 06/25/19 0323  NA 134*  --  135  K 4.2  --  4.1  CL 96*  --  98  CO2 24  --  25  GLUCOSE 367*  --  271*  BUN 21  --  23  CREATININE 1.21* 1.20* 1.31*  CALCIUM 9.5  --  8.8*  MG  --  1.5*  --   PHOS  --  2.2*  --    Estimated Creatinine Clearance: 27.1 mL/min (A) (by C-G formula based on SCr of 1.31 mg/dL (H)). Liver & Pancreas: Recent Labs  Lab 06/24/19 1645 06/25/19 0323  AST 290* 162*  ALT 301* 213*  ALKPHOS 510* 379*  BILITOT 6.4* 5.9*  PROT 7.4 5.9*  ALBUMIN 3.6 2.8*   Recent Labs  Lab 06/24/19 1645 06/25/19 0323  LIPASE 344* 814*   No results for input(s): AMMONIA in the last 168 hours. Diabetic: Recent Labs    06/24/19 2207  HGBA1C 7.1*   Recent Labs  Lab 06/24/19 2126 06/25/19 0619 06/25/19 1127  GLUCAP 273* 309* 226*   Cardiac Enzymes: No results for input(s): CKTOTAL, CKMB, CKMBINDEX, TROPONINI in the last 168 hours. No results for input(s): PROBNP in the last 8760 hours. Coagulation Profile: Recent Labs  Lab  06/24/19 2207  INR 1.3*   Thyroid Function Tests: Recent Labs    06/24/19 2207  TSH 1.287  1.286   Lipid Profile: Recent Labs    06/24/19 2207  CHOL 193  HDL 48  LDLCALC 119*  TRIG 130  CHOLHDL 4.0   Anemia Panel: No results for input(s): VITAMINB12, FOLATE, FERRITIN, TIBC, IRON, RETICCTPCT in the last 72 hours. Urine analysis: No results found for: COLORURINE, APPEARANCEUR, LABSPEC, PHURINE, GLUCOSEU, HGBUR, BILIRUBINUR, KETONESUR, PROTEINUR, UROBILINOGEN, NITRITE, LEUKOCYTESUR Sepsis Labs: Invalid input(s): PROCALCITONIN, [place]  Microbiology: Recent Results (from the past 240 hour(s))  SARS CORONAVIRUS 2 (TAT 6-12 HRS) Nasal Swab Aptima Multi Swab     Status: None   Collection Time: 06/24/19  6:26 PM   Specimen: Aptima Multi Swab; Nasal Swab  Result Value Ref Range Status   SARS Coronavirus 2 NEGATIVE NEGATIVE Final    Comment: (NOTE) SARS-CoV-2 target nucleic acids are NOT DETECTED. The SARS-CoV-2 RNA  is generally detectable in upper and lower respiratory specimens during the acute phase of infection. Negative results do not preclude SARS-CoV-2 infection, do not rule out co-infections with other pathogens, and should not be used as the sole basis for treatment or other patient management decisions. Negative results must be combined with clinical observations, patient history, and epidemiological information. The expected result is Negative. Fact Sheet for Patients: SugarRoll.be Fact Sheet for Healthcare Providers: https://www.woods-mathews.com/ This test is not yet approved or cleared by the [place] FDA and  has been authorized for detection and/or diagnosis of SARS-CoV-2 by FDA under an Emergency Use Authorization (EUA). This EUA will remain  in effect (meaning this test can be used) for the duration of the COVID-19 declaration under Section 56 4(b)(1) of the Act, 21 U.S.C. section 360bbb-3(b)(1), unless  the authorization is terminated or revoked sooner. Performed at [facility], [place] [address] [place], [place] [address]   Culture, blood (routine x 2)     Status: None (Preliminary result)   Collection Time: 06/24/19 10:06 PM   Specimen: BLOOD  Result Value Ref Range Status   Specimen Description BLOOD RIGHT ANTECUBITAL  Final   Special Requests   Final    BOTTLES DRAWN AEROBIC AND ANAEROBIC Blood Culture adequate volume   Culture   Final    NO GROWTH < 12 HOURS Performed at [facility], [place] [address], [place], [place] [address]    Report Status PENDING  Incomplete  Culture, blood (routine x 2)     Status: None (Preliminary result)   Collection Time: 06/24/19 10:14 PM   Specimen: BLOOD RIGHT FOREARM  Result Value Ref Range Status   Specimen Description BLOOD RIGHT FOREARM  Final   Special Requests   Final    BOTTLES DRAWN AEROBIC ONLY Blood Culture adequate volume   Culture   Final    NO GROWTH < 12 HOURS Performed at [facility], [place] [address] [place], [place] [address]    Report Status PENDING  Incomplete    Radiology Studies: Dg Chest 2 View  Result Date: 06/24/2019 CLINICAL DATA:  Epigastric pain at began prior to going to bed last night, lasted 3 night, history coronary artery disease, hypertension, GERD, diabetes mellitus EXAM: CHEST - 2 VIEW COMPARISON:  03/01/2018 FINDINGS: Normal heart size and pulmonary vascularity. Hiatal hernia. Atherosclerotic calcifications aorta. Chronic bronchitic changes with linear scarring at RIGHT base. No acute infiltrate, pleural effusion, or pneumothorax. Bones demineralized with mild degenerative disc disease changes of the thoracic spine. IMPRESSION: Bronchitic changes with RIGHT basilar atelectasis. Electronically Signed   By: Lavonia Dana M.D.   On: 06/24/2019 12:53   Ct Abdomen Pelvis W Contrast  Result Date: 06/24/2019 CLINICAL DATA:  Epigastric abdominal pain since last night. EXAM: CT ABDOMEN AND PELVIS WITH  CONTRAST TECHNIQUE: Multidetector CT imaging of the abdomen and pelvis was performed using the standard protocol following bolus administration of intravenous contrast. CONTRAST:  28mL OMNIPAQUE IOHEXOL 300 MG/ML  SOLN COMPARISON:  Abdominal ultrasound of 03/02/2018. Abdominal CT of 03/02/2018. FINDINGS: Lower chest: Subsegmental atelectasis at the bases. Normal heart size without pericardial or pleural effusion. A moderate hiatal hernia. Hepatobiliary: Degradation secondary to patient arm position and EKG wires and leads, affecting the upper abdomen. No focal liver lesion. Cholecystectomy. Mild intrahepatic biliary duct dilatation. The common duct measures within normal limits at 8 mm in the porta hepatis on transverse imaging. Up to 9 mm on coronal image 45/6. Possible choledocholithiasis distally, including  on coronal image 47. Pancreas: Suspicion of mild pancreatic enlargement for age and mild peripancreatic edema. Example adjacent the head on image 27/3. No pancreatic necrosis or duct dilatation. Spleen: Normal in size, without focal abnormality. Adrenals/Urinary Tract: Mild left adrenal thickening. Normal right adrenal gland. Interpolar left renal 2.6 cm cyst. Normal right kidney, without hydronephrosis or hydroureter. Normal urinary bladder. Stomach/Bowel: Gastric antral underdistention. Extensive colonic diverticulosis. Normal terminal ileum. Normal small bowel. Vascular/Lymphatic: Advanced aortic and branch vessel atherosclerosis. Patent portal and splenic veins. No abdominopelvic adenopathy. Reproductive: Normal uterus and adnexa. Other: No significant free fluid. Musculoskeletal: Degenerative disc disease, primarily at L3-4 and L2-3. IMPRESSION: 1. Findings suspicious for non complicated pancreatitis. 2. Cholecystectomy. Development of intrahepatic biliary duct dilatation. Although the common duct is normal in caliber for age, suspicion of distal choledocholithiasis. Correlate with bilirubin levels. 3.  Moderate hiatal hernia. 4.  Aortic Atherosclerosis (ICD10-I70.0). Electronically Signed   By: Abigail Miyamoto M.D.   On: 06/24/2019 20:13    35 minutes with more than 50% spent in reviewing records, counseling patient/family and coordinating care.  Taye T. [place]  If 7PM-7AM, please contact night-coverage [email or web address] Password [facility] 06/25/2019, 12:22 PM

## 2019-06-25 NOTE — Progress Notes (Signed)
MRCP concerning for choledocholithiasis. Patient's PCP is at [facility].  Stat consult to [facility].

## 2019-06-25 NOTE — TOC Initial Note (Signed)
Transition of Care [facility]) - Initial/Assessment Note    Patient Details  Name: Nancy Webster MRN: [id number] Date of Birth: 02-Jan-1928  Transition of Care [facility]) CM/SW Contact:    Alberteen Sam, [place] Phone Number: [phone] 06/25/2019, 2:38 PM  Clinical Narrative:                  CSW consulted with patient regarding her discharge plan. She reports living independently at her apartment at [facility]. She states her daughter lives in [place] and see's her about once a week to assist with any household needs patient may have. She states her daughter is currently caring for her cat that she misses. Patient reports being stable to go home, CSW offered any in home assistance/home health needs and patient declined stating that she remains independent and should she need anything her daughter will assist her.   CSW has updated [facility] on discharge plan back to ILF apartment, will continue to follow for when patient is medically stable to discharge. Patient reports her daughter will transport her back to [facility] when she is ready to discharge.   Expected Discharge Plan: Home/Self Care Barriers to Discharge: Continued Medical Work up   Patient Goals and CMS Choice Patient states their goals for this hospitalization and ongoing recovery are:: to go back to her apartment at [facility].gov Compare Post Acute Care list provided to:: Patient Choice offered to / list presented to : Patient  Expected Discharge Plan and Services Expected Discharge Plan: Home/Self Care     Post Acute Care Choice: (apartment/ ILF at [facility]) Living arrangements for the past 2 months: [place]                                      Prior Living Arrangements/Services Living arrangements for the past 2 months: [place]   Patient language and need for interpreter reviewed:: Yes Do you feel safe going back to the place where you live?: Yes       Need for Family Participation in Patient Care: Yes (Comment) Care giver support system in place?: Yes (comment)   Criminal Activity/Legal Involvement Pertinent to Current Situation/Hospitalization: No - Comment as needed  Activities of Daily Living Home Assistive Devices/Equipment: None ADL Screening (condition at time of admission) Patient's cognitive ability adequate to safely complete daily activities?: Yes Is the patient deaf or have difficulty hearing?: No Does the patient have difficulty seeing, even when wearing glasses/contacts?: No Does the patient have difficulty concentrating, remembering, or making decisions?: Yes Patient able to express need for assistance with ADLs?: Yes Does the patient have difficulty dressing or bathing?: No Independently performs ADLs?: Yes (appropriate for developmental age) Does the patient have difficulty walking or climbing stairs?: Yes Weakness of Legs: Both Weakness of Arms/Hands: Both  Permission Sought/Granted Permission sought to share information with : Facility Sport and exercise psychologist, Tourist information centre manager, Family Supports Permission granted to share information with : Yes, Verbal Permission Granted  Share Information with NAME: Jana Half  Permission granted to share info w AGENCY: ILF  Permission granted to share info w Relationship: daughter  Permission granted to share info w Contact Information: [phone]  Emotional Assessment Appearance:: Appears stated age Attitude/Demeanor/Rapport: Gracious Affect (typically observed): Calm Orientation: : Oriented to Self, Oriented to Place, Oriented to  Time, Oriented to Situation   Psych Involvement: No (comment)  Admission diagnosis:  Hyperbilirubinemia [E80.6] Transaminitis [R74.0] Elevated troponin [  R79.89] Leukocytosis, unspecified type [D72.829] Acute pancreatitis, unspecified complication status, unspecified pancreatitis type [K85.90] Patient Active Problem List   Diagnosis Date Noted  .  Abdominal pain 06/24/2019  . Acute pancreatitis 06/24/2019  . AKI (acute kidney injury) ([place]) 03/01/2018  . Essential hypertension 03/01/2018  . Paroxysmal SVT (supraventricular tachycardia) ([place]) 03/01/2018  . Diabetes mellitus ([place]) 03/01/2018  . Acute cholecystitis 03/01/2018  . Trigeminal neuralgia 03/01/2018  . Generalized anxiety disorder 03/01/2018   PCP:  Lajean Manes, MD Pharmacy:   [facility], [place] [address] Phone: [phone] Fax: [phone]  [id number] - [place], [place] [id number] [place] [id number] Phone: [phone] Fax: [phone]     Social Determinants of Health (SDOH) Interventions    Readmission Risk Interventions No flowsheet data found.

## 2019-06-25 NOTE — Consult Note (Signed)
[place] Reason for consult: CBD stone. Referring Physician: [facility] hospitalist.  PCP: Dr. Wilhelmina Mcardle Nancy Webster is an 85 y.o. female.  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  She has a history of diabetes managed with pills and has had SVT in the past.  She has hypertension and CKD stage III.  1 year ago she was admitted with acute cholecystitis and underwent a laparoscopic cholecystectomy by Dr. Greer Pickerel.  She did well following that surgery.  She felt well Monday night went to bed.  She developed epigastric and chest pain that woke her up.  She thought it might be reflux and did drink a milkshake of sort that did not help.  She had a similar episode several weeks earlier that was not nearly as severe and improved.  Yesterday she began to have chills and nausea did not vomit and came to the emergency room.  Her labs were remarkable for a WBC of 25.5, elevated transaminases around 300 and AP of 510.  Her total bilirubin was 6.4 and lipase was 344.  Troponin was normal as was EKG.  Since last night her lipase is increased to 814 and WBC has increased to 28.  She received meropenem, first dose last night and has not had any further chills since then.  MRCP was performed today and showed a normal CBD with a filling defect at the distal CBD near the ampulla consistent with a 4 to 5 mm CBD stone.  CT scan from the ER last night showed mild pancreatitis with no necrosis.  Patient states that she does clinically feel better.  Past Medical History:  Diagnosis Date  . Anxiety   . Coronary artery disease   . Diabetes mellitus without complication ([facility])   . GERD (gastroesophageal reflux disease)   . Hypertension   . Renal disorder    stage 3    Past Surgical History:  Procedure Laterality Date  . CHOLECYSTECTOMY N/A 03/02/2018   Procedure: LAPAROSCOPIC CHOLECYSTECTOMY;  Surgeon: Greer Pickerel, MD;  Location: [facility] OR;  Service: General;  Laterality: N/A;    Family  History  Problem Relation Age of Onset  . Heart failure Mother     Social History:  reports that she has never smoked. She has never used smokeless tobacco. She reports previous alcohol use. She reports that she does not use drugs.  Allergies:  Allergies  Allergen Reactions  . Atorvastatin Other (See Comments)    UNKNOWN  . Other Other (See Comments)    Cramps to all Cholesterol medications  . Statins Other (See Comments)    "cramps" UNKNOWN    Medications; Prior to Admission medications   Medication Sig Start Date End Date Taking? Authorizing Provider  acetaminophen (TYLENOL) 500 MG tablet Take 500 mg by mouth at bedtime.   Yes [provider]  ALPRAZolam Duanne Moron) 0.5 MG tablet Take 0.5 mg by mouth at bedtime as needed for anxiety.   Yes [provider]  benazepril (LOTENSIN) 10 MG tablet Take 10 mg by mouth daily.   Yes [provider]  furosemide (LASIX) 40 MG tablet Take 20 mg by mouth daily as needed for fluid.    Yes [provider]  gabapentin (NEURONTIN) 100 MG capsule Take 100 mg by mouth at bedtime.   Yes [provider]  isosorbide mononitrate (IMDUR) 30 MG 24 hr tablet Take 30 mg by mouth daily.   Yes [provider]  metFORMIN (GLUCOPHAGE) 1000  MG tablet Take 1,000 mg by mouth 2 (two) times daily.    Yes [provider]  metoprolol succinate (TOPROL-XL) 25 MG 24 hr tablet Take 25 mg by mouth daily.   Yes [provider]  Multiple Vitamin (MULTIVITAMIN WITH MINERALS) TABS tablet Take 1 tablet by mouth daily.   Yes [provider]  pantoprazole (PROTONIX) 40 MG tablet Take 40 mg by mouth daily.   Yes [provider]  sodium chloride (OCEAN) 0.65 % SOLN nasal spray Place 1 spray into both nostrils as needed for congestion.   Yes [provider]   . enoxaparin (LOVENOX) injection  30 mg Subcutaneous Q24H  . gabapentin  100 mg Oral QHS  . insulin aspart  0-9 Units Subcutaneous  TID WC  . metoprolol succinate  25 mg Oral Daily  . multivitamin with minerals  1 tablet Oral Daily  . pantoprazole  40 mg Oral Daily  . sodium chloride flush  3 mL Intravenous Once   PRN Meds sodium chloride, ALPRAZolam, HYDROmorphone (DILAUDID) injection, ondansetron, oxyCODONE, sodium chloride Results for orders placed or performed during the hospital encounter of 06/24/19 (from the past 48 hour(s))  Basic metabolic panel     Status: Abnormal   Collection Time: 06/24/19 12:07 PM  Result Value Ref Range   Sodium 134 (L) 135 - 145 mmol/L   Potassium 4.2 3.5 - 5.1 mmol/L   Chloride 96 (L) 98 - 111 mmol/L   CO2 24 22 - 32 mmol/L   Glucose, Bld 367 (H) 70 - 99 mg/dL   BUN 21 8 - 23 mg/dL   Creatinine, Ser 1.21 (H) 0.44 - 1.00 mg/dL   Calcium 9.5 8.9 - 10.3 mg/dL   GFR calc non Af Amer 40 (L) >60 mL/min   GFR calc Af Amer 46 (L) >60 mL/min   Anion gap 14 5 - 15    Comment: Performed at [facility], [address], [place] [address]  CBC     Status: Abnormal   Collection Time: 06/24/19 12:07 PM  Result Value Ref Range   WBC 25.5 (H) 4.0 - 10.5 K/uL   RBC 4.08 3.87 - 5.11 MIL/uL   Hemoglobin 12.9 12.0 - 15.0 g/dL   HCT 40.3 36.0 - 46.0 %   MCV 98.8 80.0 - 100.0 fL   MCH 31.6 26.0 - 34.0 pg   MCHC 32.0 30.0 - 36.0 g/dL   RDW 13.9 11.5 - 15.5 %   Platelets 357 150 - 400 K/uL   nRBC 0.0 0.0 - 0.2 %    Comment: Performed at [facility], [place] [address], [place], [place] [address]  Troponin I (High Sensitivity)     Status: Abnormal   Collection Time: 06/24/19 12:07 PM  Result Value Ref Range   Troponin I (High Sensitivity) 20 (H) <18 ng/L    Comment: (NOTE) Elevated high sensitivity troponin I (hsTnI) values and significant  changes across serial measurements may suggest ACS but many other  chronic and acute conditions are known to elevate hsTnI results.  Refer to the "Links" section for chest pain algorithms and additional  guidance. Performed at [facility], [place] [address], [place],  [address]   Hepatic function panel     Status: Abnormal   Collection Time: 06/24/19  4:45 PM  Result Value Ref Range   Total Protein 7.4 6.5 - 8.1 g/dL   Albumin 3.6 3.5 - 5.0 g/dL   AST 290 (H) 15 -  41 U/L   ALT 301 (H) 0 - 44 U/L   Alkaline Phosphatase 510 (H) 38 - 126 U/L   Total Bilirubin 6.4 (H) 0.3 - 1.2 mg/dL   Bilirubin, Direct 4.0 (H) 0.0 - 0.2 mg/dL   Indirect Bilirubin 2.4 (H) 0.3 - 0.9 mg/dL    Comment: Performed at [facility] [address], [place], [place] [address]  Lipase, blood     Status: Abnormal   Collection Time: 06/24/19  4:45 PM  Result Value Ref Range   Lipase 344 (H) 11 - 51 U/L    Comment: Performed at [facility] [address] [place], [place] [address]  Troponin I (High Sensitivity)     Status: Abnormal   Collection Time: 06/24/19  4:45 PM  Result Value Ref Range   Troponin I (High Sensitivity) 31 (H) <18 ng/L    Comment: (NOTE) Elevated high sensitivity troponin I (hsTnI) values and significant  changes across serial measurements may suggest ACS but many other  chronic and acute conditions are known to elevate hsTnI results.  Refer to the "Links" section for chest pain algorithms and additional  guidance. Performed at [facility], [place] [address], [place], [place] [address]   SARS CORONAVIRUS 2 (TAT 6-12 HRS) Nasal Swab Aptima Multi Swab     Status: None   Collection Time: 06/24/19  6:26 PM   Specimen: Aptima Multi Swab; Nasal Swab  Result Value Ref Range   SARS Coronavirus 2 NEGATIVE NEGATIVE    Comment: (NOTE) SARS-CoV-2 target nucleic acids are NOT DETECTED. The SARS-CoV-2 RNA is generally detectable in upper and lower respiratory specimens during the acute phase of infection. Negative results do not preclude SARS-CoV-2 infection, do not rule out co-infections with other pathogens, and should not be used as the sole basis for treatment or other patient management  decisions. Negative results must be combined with clinical observations, patient history, and epidemiological information. The expected result is Negative. Fact Sheet for Patients: SugarRoll.be Fact Sheet for Healthcare Providers: https://www.woods-mathews.com/ This test is not yet approved or cleared by the [place] FDA and  has been authorized for detection and/or diagnosis of SARS-CoV-2 by FDA under an Emergency Use Authorization (EUA). This EUA will remain  in effect (meaning this test can be used) for the duration of the COVID-19 declaration under Section 56 4(b)(1) of the Act, 21 U.S.C. section 360bbb-3(b)(1), unless the authorization is terminated or revoked sooner. Performed at [facility], [place] [address] [place], [place] [address]   Glucose, capillary     Status: Abnormal   Collection Time: 06/24/19  9:26 PM  Result Value Ref Range   Glucose-Capillary 273 (H) 70 - 99 mg/dL   Comment 1 Notify RN    Comment 2 Document in Chart   Culture, blood (routine x 2)     Status: None (Preliminary result)   Collection Time: 06/24/19 10:06 PM   Specimen: BLOOD  Result Value Ref Range   Specimen Description BLOOD RIGHT ANTECUBITAL    Special Requests      BOTTLES DRAWN AEROBIC AND ANAEROBIC Blood Culture adequate volume   Culture      NO GROWTH < 24 HOURS Performed at [facility], [place] [address], [place], [place] [address]    Report Status PENDING   Hemoglobin A1c     Status: Abnormal   Collection Time: 06/24/19 10:07 PM  Result Value Ref Range   Hgb A1c MFr Bld 7.1 (H)  4.8 - 5.6 %    Comment: (NOTE) Pre diabetes:          5.7%-6.4% Diabetes:              >6.4% Glycemic control for   <7.0% adults with diabetes    Mean Plasma Glucose 157.07 mg/dL    Comment: Performed at [facility], [place] [address] [place], [place] [address]  CBC     Status: Abnormal   Collection Time: 06/24/19 10:07 PM  Result Value Ref  Range   WBC 28.5 (H) 4.0 - 10.5 K/uL   RBC 3.99 3.87 - 5.11 MIL/uL   Hemoglobin 12.9 12.0 - 15.0 g/dL   HCT 37.5 36.0 - 46.0 %   MCV 94.0 80.0 - 100.0 fL   MCH 32.3 26.0 - 34.0 pg   MCHC 34.4 30.0 - 36.0 g/dL   RDW 14.1 11.5 - 15.5 %   Platelets 289 150 - 400 K/uL   nRBC 0.0 0.0 - 0.2 %    Comment: Performed at [facility], [place] [address] [place], [place] [address]  Creatinine, serum     Status: Abnormal   Collection Time: 06/24/19 10:07 PM  Result Value Ref Range   Creatinine, Ser 1.20 (H) 0.44 - 1.00 mg/dL   GFR calc non Af Amer 40 (L) >60 mL/min   GFR calc Af Amer 46 (L) >60 mL/min    Comment: Performed at [facility] [address], [place], [place] [address]  Magnesium     Status: Abnormal   Collection Time: 06/24/19 10:07 PM  Result Value Ref Range   Magnesium 1.5 (L) 1.7 - 2.4 mg/dL    Comment: Performed at [facility] [address], [place], [place] [address]  Phosphorus     Status: Abnormal   Collection Time: 06/24/19 10:07 PM  Result Value Ref Range   Phosphorus 2.2 (L) 2.5 - 4.6 mg/dL    Comment: Performed at [facility] [address] [place], [place] [address]  TSH     Status: None   Collection Time: 06/24/19 10:07 PM  Result Value Ref Range   TSH 1.286 0.350 - 4.500 uIU/mL    Comment: Performed by a 3rd Generation assay with a functional sensitivity of <=0.01 uIU/mL. Performed at [facility], [place] [address], [place], [place] [address]   Protime-INR     Status: Abnormal   Collection Time: 06/24/19 10:07 PM  Result Value Ref Range   Prothrombin Time 15.7 (H) 11.4 - 15.2 seconds   INR 1.3 (H) 0.8 - 1.2    Comment: (NOTE) INR goal varies based on device and disease states. Performed at [facility], [place] [address] [place], [place] [address]   APTT     Status: None   Collection Time: 06/24/19 10:07 PM  Result Value Ref Range   aPTT 35 24 - 36 seconds    Comment: Performed at [facility] [address], [place], [place] [address]  TSH     Status: None   Collection Time: 06/24/19 10:07 PM  Result Value Ref Range   TSH 1.287 0.350 - 4.500 uIU/mL    Comment: Performed by a 3rd Generation assay with a functional sensitivity of <=0.01 uIU/mL. Performed at [facility], [place] [address], [place],  [address]   Lipid panel     Status: Abnormal   Collection Time: 06/24/19 10:07 PM  Result Value Ref  Range   Cholesterol 193 0 - 200 mg/dL   Triglycerides 130 <150 mg/dL   HDL 48 >40 mg/dL   Total CHOL/HDL Ratio 4.0 RATIO   VLDL 26 0 - 40 mg/dL   LDL Cholesterol 119 (H) 0 - 99 mg/dL    Comment:        Total Cholesterol/HDL:CHD Risk Coronary Heart Disease Risk Table                     Men   Women  1/2 Average Risk   3.4   3.3  Average Risk       5.0   4.4  2 X Average Risk   9.6   7.1  3 X Average Risk  23.4   11.0        Use the calculated Patient Ratio above and the CHD Risk Table to determine the patient's CHD Risk.        ATP III CLASSIFICATION (LDL):  <100     mg/dL   Optimal  100-129  mg/dL   Near or Above                    Optimal  130-159  mg/dL   Borderline  160-189  mg/dL   High  >190     mg/dL   Very High Performed at [facility] [address], [place], [place] [address]   Culture, blood (routine x 2)     Status: None (Preliminary result)   Collection Time: 06/24/19 10:14 PM   Specimen: BLOOD RIGHT FOREARM  Result Value Ref Range   Specimen Description BLOOD RIGHT FOREARM    Special Requests      BOTTLES DRAWN AEROBIC ONLY Blood Culture adequate volume   Culture      NO GROWTH < 24 HOURS Performed at [facility], [place] [address], [place], [place] [address]    Report Status PENDING   Comprehensive metabolic panel     Status: Abnormal   Collection Time: 06/25/19  3:23 AM  Result Value Ref Range   Sodium 135 135 - 145 mmol/L   Potassium 4.1 3.5 - 5.1 mmol/L   Chloride 98 98 - 111 mmol/L   CO2 25 22 - 32 mmol/L   Glucose, Bld 271 (H) 70 -  99 mg/dL   BUN 23 8 - 23 mg/dL   Creatinine, Ser 1.31 (H) 0.44 - 1.00 mg/dL   Calcium 8.8 (L) 8.9 - 10.3 mg/dL   Total Protein 5.9 (L) 6.5 - 8.1 g/dL   Albumin 2.8 (L) 3.5 - 5.0 g/dL   AST 162 (H) 15 - 41 U/L   ALT 213 (H) 0 - 44 U/L   Alkaline Phosphatase 379 (H) 38 - 126 U/L   Total Bilirubin 5.9 (H) 0.3 - 1.2 mg/dL   GFR calc non Af Amer 36 (L) >60 mL/min   GFR calc Af Amer 42 (L) >60 mL/min   Anion gap 12 5 - 15    Comment: Performed at [facility], [place] [address], [place],  [address]  Lipase, blood     Status: Abnormal   Collection Time: 06/25/19  3:23 AM  Result Value Ref Range   Lipase 814 (H) 11 - 51 U/L    Comment: RESULTS CONFIRMED BY MANUAL DILUTION Performed at [facility], [place] [address], [place], [place] [address]   Glucose, capillary     Status: Abnormal   Collection Time: 06/25/19  6:19 AM  Result Value Ref Range   Glucose-Capillary 309 (H) 70 - 99 mg/dL   Comment 1 Notify RN    Comment 2 Document in Chart   Glucose, capillary     Status: Abnormal   Collection Time: 06/25/19 11:27 AM  Result Value Ref Range   Glucose-Capillary 226 (H) 70 - 99 mg/dL  Glucose, capillary     Status: Abnormal   Collection Time: 06/25/19  4:14 PM  Result Value Ref Range   Glucose-Capillary 203 (H) 70 - 99 mg/dL    Dg Chest 2 View  Result Date: 06/24/2019 CLINICAL DATA:  Epigastric pain at began prior to going to bed last night, lasted 3 night, history coronary artery disease, hypertension, GERD, diabetes mellitus EXAM: CHEST - 2 VIEW COMPARISON:  03/01/2018 FINDINGS: Normal heart size and pulmonary vascularity. Hiatal hernia. Atherosclerotic calcifications aorta. Chronic bronchitic changes with linear scarring at RIGHT base. No acute infiltrate, pleural effusion, or pneumothorax. Bones demineralized with mild degenerative disc disease changes of the thoracic spine. IMPRESSION: Bronchitic changes with RIGHT basilar atelectasis. Electronically Signed   By: Lavonia Dana M.D.   On: 06/24/2019 12:53   Ct Abdomen Pelvis W Contrast  Result Date: 06/24/2019 CLINICAL DATA:  Epigastric abdominal pain since last night. EXAM: CT ABDOMEN AND PELVIS WITH CONTRAST TECHNIQUE: Multidetector CT imaging of the abdomen and pelvis was performed using the standard protocol following bolus administration of intravenous contrast. CONTRAST:  67mL OMNIPAQUE IOHEXOL 300 MG/ML  SOLN COMPARISON:  Abdominal ultrasound of 03/02/2018. Abdominal CT of 03/02/2018. FINDINGS: Lower chest: Subsegmental atelectasis at the bases. Normal heart size without pericardial or pleural effusion. A moderate hiatal hernia. Hepatobiliary: Degradation secondary to patient arm position and EKG wires and leads, affecting the upper abdomen. No focal liver lesion. Cholecystectomy. Mild intrahepatic biliary duct dilatation. The common duct measures within normal limits at 8 mm in the porta hepatis on transverse imaging. Up to 9 mm on coronal image 45/6. Possible choledocholithiasis distally, including on coronal image 47. Pancreas: Suspicion of mild pancreatic enlargement for age and mild peripancreatic edema. Example adjacent the head on image 27/3. No pancreatic necrosis or duct dilatation. Spleen: Normal in size, without focal abnormality. Adrenals/Urinary Tract: Mild left adrenal thickening. Normal right adrenal gland. Interpolar left renal 2.6 cm cyst. Normal right kidney, without hydronephrosis or hydroureter. Normal urinary bladder. Stomach/Bowel: Gastric antral underdistention. Extensive colonic diverticulosis. Normal terminal ileum. Normal small bowel. Vascular/Lymphatic: Advanced aortic and branch vessel atherosclerosis. Patent portal and splenic veins. No abdominopelvic adenopathy. Reproductive: Normal uterus and adnexa. Other: No significant free fluid. Musculoskeletal: Degenerative disc disease, primarily at L3-4 and L2-3. IMPRESSION: 1. Findings suspicious for non complicated pancreatitis. 2. Cholecystectomy.  Development of intrahepatic biliary duct dilatation. Although the common duct is normal in caliber for age, suspicion of distal choledocholithiasis. Correlate with bilirubin levels. 3. Moderate hiatal hernia. 4.  Aortic Atherosclerosis (ICD10-I70.0). Electronically Signed   By: Abigail Miyamoto M.D.   On: 06/24/2019 20:13   Mr Abdomen Mrcp Wo Contrast  Result Date: 06/25/2019 CLINICAL DATA:  Epigastric pain. CT demonstrating biliary duct dilatation. Elevated lipase. EXAM: MRI ABDOMEN WITHOUT CONTRAST  (INCLUDING MRCP) TECHNIQUE: Multiplanar multisequence MR imaging of the abdomen was performed. Heavily T2-weighted images of the biliary and pancreatic ducts were obtained, and three-dimensional MRCP images were rendered by post processing. COMPARISON:  CT of 06/24/2019 FINDINGS: Portions of exam are mildly motion degraded. Lower chest: Normal heart size.  Moderate hiatal hernia. Hepatobiliary: No focal liver lesion. Mild hepatic steatosis. Cholecystectomy. Mild  intrahepatic biliary duct dilatation. The common duct measures 7 mm on 18/3. Approximately 4-5 mm signal abnormality at the level of the ampulla, including on images 19/3, 46/8, 18/10. Pancreas: Peripancreatic edema is mild, including on series 5. No pancreatic duct dilatation. Spleen:  Normal in size, without focal abnormality. Adrenals/Urinary Tract: Left adrenal thickening. Normal right adrenal gland. Normal right kidney. Left renal interpolar 2.9 cm cyst. Stomach/Bowel: Normal abdominal bowel loops. Vascular/Lymphatic: Normal abdominal aortic caliber. Mildly prominent porta hepatis nodes are likely reactive. Other:  No ascites. Musculoskeletal: Mild convex left lumbar spine curvature. IMPRESSION: 1. Intrahepatic biliary duct dilatation after cholecystectomy. Although the common duct is normal in caliber, signal abnormality of 4-5 mm at the level of the ampulla is highly suspicious for isolated choledocholithiasis. Consider ERCP. 2. Mild pancreatitis, as  before. 3. Moderate hiatal hernia. 4. Mild hepatic steatosis. Electronically Signed   By: Abigail Miyamoto M.D.   On: 06/25/2019 14:04               Blood pressure (!) 98/47, pulse 93, temperature (!) 97.5 F (36.4 C), temperature source Oral, resp. rate 17, height 5\' 3"  (1.6 m), weight 68.6 kg, SpO2 95 %.  Physical exam:   General--talkative white female in no distress ENT--nonicteric throat is grossly normal Neck--supple Heart--somewhat tachycardic Lungs--clear Abdomen--soft with mild epigastric tenderness few bowel sounds are present Psych--alert and oriented answers questions appropriately  Assessment: 1.  Gallstone pancreatitis.  MRCP suggests CBD stone impacted at the ampulla.  With the patient's WBC increasing, recent chills and increasing lipase I am afraid the stone may be impacted.  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  They understand.  Plan: 1.  We will plan on proceeding with ERCP and stone extraction tomorrow.  This will be performed at 12: 30 by Dr. Therisa Doyne.  Have discussed this in detail with the patient and daughter including the risk of bleeding if sphincterotomy performed and pancreatitis.  We will keep her n.p.o. other than ice chips and continue on antibiotics.   Nancy Fetter 06/25/2019, 4:21 PM   This note was created using voice recognition software and minor errors may Have occurred unintentionally. Pager: [phone] If no answer or after hours call [phone]

## 2019-06-25 NOTE — H&P (View-Only) (Signed)
Lorenzo Reason for consult: CBD stone. Referring Physician: [facility] hospitalist.  PCP: Dr. Wilhelmina Mcardle Nancy Webster is an 85 y.o. female.  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  She has a history of diabetes managed with pills and has had SVT in the past.  She has hypertension and CKD stage III.  1 year ago she was admitted with acute cholecystitis and underwent a laparoscopic cholecystectomy by Dr. Greer Pickerel.  She did well following that surgery.  She felt well Monday night went to bed.  She developed epigastric and chest pain that woke her up.  She thought it might be reflux and did drink a milkshake of sort that did not help.  She had a similar episode several weeks earlier that was not nearly as severe and improved.  Yesterday she began to have chills and nausea did not vomit and came to the emergency room.  Her labs were remarkable for a WBC of 25.5, elevated transaminases around 300 and AP of 510.  Her total bilirubin was 6.4 and lipase was 344.  Troponin was normal as was EKG.  Since last night her lipase is increased to 814 and WBC has increased to 28.  She received meropenem, first dose last night and has not had any further chills since then.  MRCP was performed today and showed a normal CBD with a filling defect at the distal CBD near the ampulla consistent with a 4 to 5 mm CBD stone.  CT scan from the ER last night showed mild pancreatitis with no necrosis.  Patient states that she does clinically feel better.  Past Medical History:  Diagnosis Date  . Anxiety   . Coronary artery disease   . Diabetes mellitus without complication (Nancy Webster)   . GERD (gastroesophageal reflux disease)   . Hypertension   . Renal disorder    stage 3    Past Surgical History:  Procedure Laterality Date  . CHOLECYSTECTOMY N/A 03/02/2018   Procedure: LAPAROSCOPIC CHOLECYSTECTOMY;  Surgeon: Greer Pickerel, MD;  Location: [facility] OR;  Service: General;  Laterality: N/A;    Family  History  Problem Relation Age of Onset  . Heart failure Mother     Social History:  reports that she has never smoked. She has never used smokeless tobacco. She reports previous alcohol use. She reports that she does not use drugs.  Allergies:  Allergies  Allergen Reactions  . Atorvastatin Other (See Comments)    UNKNOWN  . Other Other (See Comments)    Cramps to all Cholesterol medications  . Statins Other (See Comments)    "cramps" UNKNOWN    Medications; Prior to Admission medications   Medication Sig Start Date End Date Taking? Authorizing Provider  acetaminophen (TYLENOL) 500 MG tablet Take 500 mg by mouth at bedtime.   Yes [provider]  ALPRAZolam Duanne Moron) 0.5 MG tablet Take 0.5 mg by mouth at bedtime as needed for anxiety.   Yes [provider]  benazepril (LOTENSIN) 10 MG tablet Take 10 mg by mouth daily.   Yes [provider]  furosemide (LASIX) 40 MG tablet Take 20 mg by mouth daily as needed for fluid.    Yes [provider]  gabapentin (NEURONTIN) 100 MG capsule Take 100 mg by mouth at bedtime.   Yes [provider]  isosorbide mononitrate (IMDUR) 30 MG 24 hr tablet Take 30 mg by mouth daily.   Yes [provider]  metFORMIN (GLUCOPHAGE) 1000  MG tablet Take 1,000 mg by mouth 2 (two) times daily.    Yes [provider]  metoprolol succinate (TOPROL-XL) 25 MG 24 hr tablet Take 25 mg by mouth daily.   Yes [provider]  Multiple Vitamin (MULTIVITAMIN WITH MINERALS) TABS tablet Take 1 tablet by mouth daily.   Yes [provider]  pantoprazole (PROTONIX) 40 MG tablet Take 40 mg by mouth daily.   Yes [provider]  sodium chloride (OCEAN) 0.65 % SOLN nasal spray Place 1 spray into both nostrils as needed for congestion.   Yes [provider]   . enoxaparin (LOVENOX) injection  30 mg Subcutaneous Q24H  . gabapentin  100 mg Oral QHS  . insulin aspart  0-9 Units Subcutaneous  TID WC  . metoprolol succinate  25 mg Oral Daily  . multivitamin with minerals  1 tablet Oral Daily  . pantoprazole  40 mg Oral Daily  . sodium chloride flush  3 mL Intravenous Once   PRN Meds sodium chloride, ALPRAZolam, HYDROmorphone (DILAUDID) injection, ondansetron, oxyCODONE, sodium chloride Results for orders placed or performed during the hospital encounter of 06/24/19 (from the past 48 hour(s))  Basic metabolic panel     Status: Abnormal   Collection Time: 06/24/19 12:07 PM  Result Value Ref [place]   Sodium 134 (L) 135 - 145 mmol/L   Potassium 4.2 3.5 - 5.1 mmol/L   Chloride 96 (L) 98 - 111 mmol/L   CO2 24 22 - 32 mmol/L   Glucose, Bld 367 (H) 70 - 99 mg/dL   BUN 21 8 - 23 mg/dL   Creatinine, Ser 1.21 (H) 0.44 - 1.00 mg/dL   Calcium 9.5 8.9 - 10.3 mg/dL   GFR calc non Af Amer 40 (L) >60 mL/min   GFR calc Af Amer 46 (L) >60 mL/min   Anion gap 14 5 - 15    Comment: Performed at [facility], [address], [place] [address]  CBC     Status: Abnormal   Collection Time: 06/24/19 12:07 PM  Result Value Ref [place]   WBC 25.5 (H) 4.0 - 10.5 K/uL   RBC 4.08 3.87 - 5.11 MIL/uL   Hemoglobin 12.9 12.0 - 15.0 g/dL   HCT 40.3 36.0 - 46.0 %   MCV 98.8 80.0 - 100.0 fL   MCH 31.6 26.0 - 34.0 pg   MCHC 32.0 30.0 - 36.0 g/dL   RDW 13.9 11.5 - 15.5 %   Platelets 357 150 - 400 K/uL   nRBC 0.0 0.0 - 0.2 %    Comment: Performed at [facility], [place] [address] [place], [place] [address]  Troponin I (High Sensitivity)     Status: Abnormal   Collection Time: 06/24/19 12:07 PM  Result Value Ref [place]   Troponin I (High Sensitivity) 20 (H) <18 ng/L    Comment: (NOTE) Elevated high sensitivity troponin I (hsTnI) values and significant  changes across serial measurements may suggest ACS but many other  chronic and acute conditions are known to elevate hsTnI results.  Refer to the "Links" section for chest pain algorithms and additional  guidance. Performed at [facility], [place] [address], [place], [place] [address]   Hepatic function panel     Status: Abnormal   Collection Time: 06/24/19  4:45 PM  Result Value Ref [place]   Total Protein 7.4 6.5 - 8.1 g/dL   Albumin 3.6 3.5 - 5.0 g/dL   AST 290 (H) 15 -  41 U/L   ALT 301 (H) 0 - 44 U/L   Alkaline Phosphatase 510 (H) 38 - 126 U/L   Total Bilirubin 6.4 (H) 0.3 - 1.2 mg/dL   Bilirubin, Direct 4.0 (H) 0.0 - 0.2 mg/dL   Indirect Bilirubin 2.4 (H) 0.3 - 0.9 mg/dL    Comment: Performed at [facility] [address], [place], [place] [address]  Lipase, blood     Status: Abnormal   Collection Time: 06/24/19  4:45 PM  Result Value Ref [place]   Lipase 344 (H) 11 - 51 U/L    Comment: Performed at [facility] [address] [place], [place] [address]  Troponin I (High Sensitivity)     Status: Abnormal   Collection Time: 06/24/19  4:45 PM  Result Value Ref [place]   Troponin I (High Sensitivity) 31 (H) <18 ng/L    Comment: (NOTE) Elevated high sensitivity troponin I (hsTnI) values and significant  changes across serial measurements may suggest ACS but many other  chronic and acute conditions are known to elevate hsTnI results.  Refer to the "Links" section for chest pain algorithms and additional  guidance. Performed at [facility], [place] [address], [place], [place] [address]   SARS CORONAVIRUS 2 (TAT 6-12 HRS) Nasal Swab Aptima Multi Swab     Status: None   Collection Time: 06/24/19  6:26 PM   Specimen: Aptima Multi Swab; Nasal Swab  Result Value Ref [place]   SARS Coronavirus 2 NEGATIVE NEGATIVE    Comment: (NOTE) SARS-CoV-2 target nucleic acids are NOT DETECTED. The SARS-CoV-2 RNA is generally detectable in upper and lower respiratory specimens during the acute phase of infection. Negative results do not preclude SARS-CoV-2 infection, do not rule out co-infections with other pathogens, and should not be used as the sole basis for treatment or other patient management  decisions. Negative results must be combined with clinical observations, patient history, and epidemiological information. The expected result is Negative. Fact Sheet for Patients: SugarRoll.be Fact Sheet for Healthcare Providers: https://www.woods-mathews.com/ This test is not yet approved or cleared by the [place] FDA and  has been authorized for detection and/or diagnosis of SARS-CoV-2 by FDA under an Emergency Use Authorization (EUA). This EUA will remain  in effect (meaning this test can be used) for the duration of the COVID-19 declaration under Section 56 4(b)(1) of the Act, 21 U.S.C. section 360bbb-3(b)(1), unless the authorization is terminated or revoked sooner. Performed at [facility], [place] [address], [place], [place] [address]   Glucose, capillary     Status: Abnormal   Collection Time: 06/24/19  9:26 PM  Result Value Ref [place]   Glucose-Capillary 273 (H) 70 - 99 mg/dL   Comment 1 Notify RN    Comment 2 Document in Chart   Culture, blood (routine x 2)     Status: None (Preliminary result)   Collection Time: 06/24/19 10:06 PM   Specimen: BLOOD  Result Value Ref [place]   Specimen Description BLOOD RIGHT ANTECUBITAL    Special Requests      BOTTLES DRAWN AEROBIC AND ANAEROBIC Blood Culture adequate volume   Culture      NO GROWTH < 24 HOURS Performed at [facility], [place] [address] [place], [place] [address]    Report Status PENDING   Hemoglobin A1c     Status: Abnormal   Collection Time: 06/24/19 10:07 PM  Result Value Ref [place]   Hgb A1c MFr Bld 7.1 (H)  4.8 - 5.6 %    Comment: (NOTE) Pre diabetes:          5.7%-6.4% Diabetes:              >6.4% Glycemic control for   <7.0% adults with diabetes    Mean Plasma Glucose 157.07 mg/dL    Comment: Performed at [facility], [place] [address] [place], [place] [address]  CBC     Status: Abnormal   Collection Time: 06/24/19 10:07 PM  Result Value Ref  [place]   WBC 28.5 (H) 4.0 - 10.5 K/uL   RBC 3.99 3.87 - 5.11 MIL/uL   Hemoglobin 12.9 12.0 - 15.0 g/dL   HCT 37.5 36.0 - 46.0 %   MCV 94.0 80.0 - 100.0 fL   MCH 32.3 26.0 - 34.0 pg   MCHC 34.4 30.0 - 36.0 g/dL   RDW 14.1 11.5 - 15.5 %   Platelets 289 150 - 400 K/uL   nRBC 0.0 0.0 - 0.2 %    Comment: Performed at [facility], [place] [address], [place], [place] [address]  Creatinine, serum     Status: Abnormal   Collection Time: 06/24/19 10:07 PM  Result Value Ref [place]   Creatinine, Ser 1.20 (H) 0.44 - 1.00 mg/dL   GFR calc non Af Amer 40 (L) >60 mL/min   GFR calc Af Amer 46 (L) >60 mL/min    Comment: Performed at [facility] [address], [place], [place] [address]  Magnesium     Status: Abnormal   Collection Time: 06/24/19 10:07 PM  Result Value Ref [place]   Magnesium 1.5 (L) 1.7 - 2.4 mg/dL    Comment: Performed at [facility] [address], [place], [place] [address]  Phosphorus     Status: Abnormal   Collection Time: 06/24/19 10:07 PM  Result Value Ref [place]   Phosphorus 2.2 (L) 2.5 - 4.6 mg/dL    Comment: Performed at [facility] [address] [place], [place] [address]  TSH     Status: None   Collection Time: 06/24/19 10:07 PM  Result Value Ref [place]   TSH 1.286 0.350 - 4.500 uIU/mL    Comment: Performed by a 3rd Generation assay with a functional sensitivity of <=0.01 uIU/mL. Performed at [facility], [place] [address] [place], [place] [address]   Protime-INR     Status: Abnormal   Collection Time: 06/24/19 10:07 PM  Result Value Ref [place]   Prothrombin Time 15.7 (H) 11.4 - 15.2 seconds   INR 1.3 (H) 0.8 - 1.2    Comment: (NOTE) INR goal varies based on device and disease states. Performed at [facility], [place] [address] [place], [place] [address]   APTT     Status: None   Collection Time: 06/24/19 10:07 PM  Result Value Ref [place]   aPTT 35 24 - 36 seconds    Comment: Performed at [facility] [address], [place], [place] [address]  TSH     Status: None   Collection Time: 06/24/19 10:07 PM  Result Value Ref [place]   TSH 1.287 0.350 - 4.500 uIU/mL    Comment: Performed by a 3rd Generation assay with a functional sensitivity of <=0.01 uIU/mL. Performed at [facility], [place] [address] [place],  [address]   Lipid panel     Status: Abnormal   Collection Time: 06/24/19 10:07 PM  Result Value Ref  [place]   Cholesterol 193 0 - 200 mg/dL   Triglycerides 130 <150 mg/dL   HDL 48 >40 mg/dL   Total CHOL/HDL Ratio 4.0 RATIO   VLDL 26 0 - 40 mg/dL   LDL Cholesterol 119 (H) 0 - 99 mg/dL    Comment:        Total Cholesterol/HDL:CHD Risk Coronary Heart Disease Risk Table                     Men   Women  1/2 Average Risk   3.4   3.3  Average Risk       5.0   4.4  2 X Average Risk   9.6   7.1  3 X Average Risk  23.4   11.0        Use the calculated Patient Ratio above and the CHD Risk Table to determine the patient's CHD Risk.        ATP III CLASSIFICATION (LDL):  <100     mg/dL   Optimal  100-129  mg/dL   Near or Above                    Optimal  130-159  mg/dL   Borderline  160-189  mg/dL   High  >190     mg/dL   Very High Performed at [facility] [address] [place], [place] [address]   Culture, blood (routine x 2)     Status: None (Preliminary result)   Collection Time: 06/24/19 10:14 PM   Specimen: BLOOD RIGHT FOREARM  Result Value Ref [place]   Specimen Description BLOOD RIGHT FOREARM    Special Requests      BOTTLES DRAWN AEROBIC ONLY Blood Culture adequate volume   Culture      NO GROWTH < 24 HOURS Performed at [facility], [place] [address], [place], [place] [address]    Report Status PENDING   Comprehensive metabolic panel     Status: Abnormal   Collection Time: 06/25/19  3:23 AM  Result Value Ref [place]   Sodium 135 135 - 145 mmol/L   Potassium 4.1 3.5 - 5.1 mmol/L   Chloride 98 98 - 111 mmol/L   CO2 25 22 - 32 mmol/L   Glucose, Bld 271 (H) 70 -  99 mg/dL   BUN 23 8 - 23 mg/dL   Creatinine, Ser 1.31 (H) 0.44 - 1.00 mg/dL   Calcium 8.8 (L) 8.9 - 10.3 mg/dL   Total Protein 5.9 (L) 6.5 - 8.1 g/dL   Albumin 2.8 (L) 3.5 - 5.0 g/dL   AST 162 (H) 15 - 41 U/L   ALT 213 (H) 0 - 44 U/L   Alkaline Phosphatase 379 (H) 38 - 126 U/L   Total Bilirubin 5.9 (H) 0.3 - 1.2 mg/dL   GFR calc non Af Amer 36 (L) >60 mL/min   GFR calc Af Amer 42 (L) >60 mL/min   Anion gap 12 5 - 15    Comment: Performed at [facility], [place] [address] [place], [place] [address]  Lipase, blood     Status: Abnormal   Collection Time: 06/25/19  3:23 AM  Result Value Ref [place]   Lipase 814 (H) 11 - 51 U/L    Comment: RESULTS CONFIRMED BY MANUAL DILUTION Performed at [facility], [place] [address], [place], [place] [address]   Glucose, capillary     Status: Abnormal   Collection Time: 06/25/19  6:19 AM  Result Value Ref [place]   Glucose-Capillary 309 (H) 70 - 99 mg/dL   Comment 1 Notify RN    Comment 2 Document in Chart   Glucose, capillary     Status: Abnormal   Collection Time: 06/25/19 11:27 AM  Result Value Ref [place]   Glucose-Capillary 226 (H) 70 - 99 mg/dL  Glucose, capillary     Status: Abnormal   Collection Time: 06/25/19  4:14 PM  Result Value Ref [place]   Glucose-Capillary 203 (H) 70 - 99 mg/dL    Dg Chest 2 View  Result Date: 06/24/2019 CLINICAL DATA:  Epigastric pain at began prior to going to bed last night, lasted 3 night, history coronary artery disease, hypertension, GERD, diabetes mellitus EXAM: CHEST - 2 VIEW COMPARISON:  03/01/2018 FINDINGS: Normal heart size and pulmonary vascularity. Hiatal hernia. Atherosclerotic calcifications aorta. Chronic bronchitic changes with linear scarring at RIGHT base. No acute infiltrate, pleural effusion, or pneumothorax. Bones demineralized with mild degenerative disc disease changes of the thoracic spine. IMPRESSION: Bronchitic changes with RIGHT basilar atelectasis. Electronically Signed   By: Lavonia Dana M.D.   On: 06/24/2019 12:53   Ct Abdomen Pelvis W Contrast  Result Date: 06/24/2019 CLINICAL DATA:  Epigastric abdominal pain since last night. EXAM: CT ABDOMEN AND PELVIS WITH CONTRAST TECHNIQUE: Multidetector CT imaging of the abdomen and pelvis was performed using the standard protocol following bolus administration of intravenous contrast. CONTRAST:  64mL OMNIPAQUE IOHEXOL 300 MG/ML  SOLN COMPARISON:  Abdominal ultrasound of 03/02/2018. Abdominal CT of 03/02/2018. FINDINGS: Lower chest: Subsegmental atelectasis at the bases. Normal heart size without pericardial or pleural effusion. A moderate hiatal hernia. Hepatobiliary: Degradation secondary to patient arm position and EKG wires and leads, affecting the upper abdomen. No focal liver lesion. Cholecystectomy. Mild intrahepatic biliary duct dilatation. The common duct measures within normal limits at 8 mm in the porta hepatis on transverse imaging. Up to 9 mm on coronal image 45/6. Possible choledocholithiasis distally, including on coronal image 47. Pancreas: Suspicion of mild pancreatic enlargement for age and mild peripancreatic edema. Example adjacent the head on image 27/3. No pancreatic necrosis or duct dilatation. Spleen: Normal in size, without focal abnormality. Adrenals/Urinary Tract: Mild left adrenal thickening. Normal right adrenal gland. Interpolar left renal 2.6 cm cyst. Normal right kidney, without hydronephrosis or hydroureter. Normal urinary bladder. Stomach/Bowel: Gastric antral underdistention. Extensive colonic diverticulosis. Normal terminal ileum. Normal small bowel. Vascular/Lymphatic: Advanced aortic and branch vessel atherosclerosis. Patent portal and splenic veins. No abdominopelvic adenopathy. Reproductive: Normal uterus and adnexa. Other: No significant free fluid. Musculoskeletal: Degenerative disc disease, primarily at L3-4 and L2-3. IMPRESSION: 1. Findings suspicious for non complicated pancreatitis. 2. Cholecystectomy.  Development of intrahepatic biliary duct dilatation. Although the common duct is normal in caliber for age, suspicion of distal choledocholithiasis. Correlate with bilirubin levels. 3. Moderate hiatal hernia. 4.  Aortic Atherosclerosis (ICD10-I70.0). Electronically Signed   By: Abigail Miyamoto M.D.   On: 06/24/2019 20:13   Mr Abdomen Mrcp Wo Contrast  Result Date: 06/25/2019 CLINICAL DATA:  Epigastric pain. CT demonstrating biliary duct dilatation. Elevated lipase. EXAM: MRI ABDOMEN WITHOUT CONTRAST  (INCLUDING MRCP) TECHNIQUE: Multiplanar multisequence MR imaging of the abdomen was performed. Heavily T2-weighted images of the biliary and pancreatic ducts were obtained, and three-dimensional MRCP images were rendered by post processing. COMPARISON:  CT of 06/24/2019 FINDINGS: Portions of exam are mildly motion degraded. Lower chest: Normal heart size.  Moderate hiatal hernia. Hepatobiliary: No focal liver lesion. Mild hepatic steatosis. Cholecystectomy. Mild  intrahepatic biliary duct dilatation. The common duct measures 7 mm on 18/3. Approximately 4-5 mm signal abnormality at the level of the ampulla, including on images 19/3, 46/8, 18/10. Pancreas: Peripancreatic edema is mild, including on series 5. No pancreatic duct dilatation. Spleen:  Normal in size, without focal abnormality. Adrenals/Urinary Tract: Left adrenal thickening. Normal right adrenal gland. Normal right kidney. Left renal interpolar 2.9 cm cyst. Stomach/Bowel: Normal abdominal bowel loops. Vascular/Lymphatic: Normal abdominal aortic caliber. Mildly prominent porta hepatis nodes are likely reactive. Other:  No ascites. Musculoskeletal: Mild convex left lumbar spine curvature. IMPRESSION: 1. Intrahepatic biliary duct dilatation after cholecystectomy. Although the common duct is normal in caliber, signal abnormality of 4-5 mm at the level of the ampulla is highly suspicious for isolated choledocholithiasis. Consider ERCP. 2. Mild pancreatitis, as  before. 3. Moderate hiatal hernia. 4. Mild hepatic steatosis. Electronically Signed   By: Abigail Miyamoto M.D.   On: 06/25/2019 14:04               Blood pressure (!) 98/47, pulse 93, temperature (!) 97.5 F (36.4 C), temperature source Oral, resp. rate 17, height 5\' 3"  (1.6 m), weight 68.6 kg, SpO2 95 %.  Physical exam:   General--talkative white female in no distress ENT--nonicteric throat is grossly normal Neck--supple Heart--somewhat tachycardic Lungs--clear Abdomen--soft with mild epigastric tenderness few bowel sounds are present Psych--alert and oriented answers questions appropriately  Assessment: 1.  Gallstone pancreatitis.  MRCP suggests CBD stone impacted at the ampulla.  With the patient's WBC increasing, recent chills and increasing lipase I am afraid the stone may be impacted.  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  They understand.  Plan: 1.  We will plan on proceeding with ERCP and stone extraction tomorrow.  This will be performed at 12: 30 by Dr. Therisa Doyne.  Have discussed this in detail with the patient and daughter including the risk of bleeding if sphincterotomy performed and pancreatitis.  We will keep her n.p.o. other than ice chips and continue on antibiotics.   Nancy Fetter 06/25/2019, 4:21 PM   This note was created using voice recognition software and minor errors may Have occurred unintentionally. Pager: [phone] If no answer or after hours call [phone]

## 2019-06-26 ENCOUNTER — Encounter (HOSPITAL_COMMUNITY)
Admission: EM | Disposition: A | Discharge status: Home / Self Care | Payer: Self-pay | Source: Home / Self Care | Attending: Student

## 2019-06-26 ENCOUNTER — Encounter (HOSPITAL_COMMUNITY): Payer: Self-pay | Admitting: Emergency Medicine

## 2019-06-26 ENCOUNTER — Inpatient Hospital Stay (HOSPITAL_COMMUNITY): Payer: Medicare Other

## 2019-06-26 ENCOUNTER — Inpatient Hospital Stay (HOSPITAL_COMMUNITY): Payer: Medicare Other | Admitting: Certified Registered Nurse Anesthetist

## 2019-06-26 DIAGNOSIS — K8050 Calculus of bile duct without cholangitis or cholecystitis without obstruction: Secondary | ICD-10-CM

## 2019-06-26 HISTORY — PX: ERCP: SHX5425

## 2019-06-26 HISTORY — PX: SPHINCTEROTOMY: SHX5544

## 2019-06-26 LAB — COMPREHENSIVE METABOLIC PANEL
ALT: 146 U/L — ABNORMAL HIGH (ref 0–44)
AST: 106 U/L — ABNORMAL HIGH (ref 15–41)
Albumin: 2.5 g/dL — ABNORMAL LOW (ref 3.5–5.0)
Alkaline Phosphatase: 384 U/L — ABNORMAL HIGH (ref 38–126)
Anion gap: 10 (ref 5–15)
BUN: 28 mg/dL — ABNORMAL HIGH (ref 8–23)
CO2: 25 mmol/L (ref 22–32)
Calcium: 8.9 mg/dL (ref 8.9–10.3)
Chloride: 104 mmol/L (ref 98–111)
Creatinine, Ser: 1.09 mg/dL — ABNORMAL HIGH (ref 0.44–1.00)
GFR calc Af Amer: 52 mL/min — ABNORMAL LOW (ref >60)
GFR calc non Af Amer: 45 mL/min — ABNORMAL LOW (ref >60)
Glucose, Bld: 232 mg/dL — ABNORMAL HIGH (ref 70–99)
Potassium: 4.2 mmol/L (ref 3.5–5.1)
Sodium: 139 mmol/L (ref 135–145)
Total Bilirubin: 5.7 mg/dL — ABNORMAL HIGH (ref 0.3–1.2)
Total Protein: 5.6 g/dL — ABNORMAL LOW (ref 6.5–8.1)

## 2019-06-26 LAB — CBC
HCT: 33.2 % — ABNORMAL LOW (ref 36.0–46.0)
Hemoglobin: 11 g/dL — ABNORMAL LOW (ref 12.0–15.0)
MCH: 31.5 pg (ref 26.0–34.0)
MCHC: 33.1 g/dL (ref 30.0–36.0)
MCV: 95.1 fL (ref 80.0–100.0)
Platelets: 258 10*3/uL (ref 150–400)
RBC: 3.49 MIL/uL — ABNORMAL LOW (ref 3.87–5.11)
RDW: 14.7 % (ref 11.5–15.5)
WBC: 15.7 10*3/uL — ABNORMAL HIGH (ref 4.0–10.5)
nRBC: 0 % (ref 0.0–0.2)

## 2019-06-26 LAB — GLUCOSE, CAPILLARY
Glucose-Capillary: 223 mg/dL — ABNORMAL HIGH (ref 70–99)
Glucose-Capillary: 222 mg/dL — ABNORMAL HIGH (ref 70–99)
Glucose-Capillary: 198 mg/dL — ABNORMAL HIGH (ref 70–99)
Glucose-Capillary: 207 mg/dL — ABNORMAL HIGH (ref 70–99)

## 2019-06-26 LAB — PHOSPHORUS: Phosphorus: 3.5 mg/dL (ref 2.5–4.6)

## 2019-06-26 LAB — MAGNESIUM: Magnesium: 2.1 mg/dL (ref 1.7–2.4)

## 2019-06-26 LAB — LIPASE, BLOOD: Lipase: 1043 U/L — ABNORMAL HIGH (ref 11–51)

## 2019-06-26 SURGERY — ERCP, WITH INTERVENTION IF INDICATED
Anesthesia: General

## 2019-06-26 MED ORDER — PROPOFOL 10 MG/ML IV BOLUS
INTRAVENOUS | Status: DC | PRN
  Administered 2019-06-26: 30 mg via INTRAVENOUS
  Administered 2019-06-26: 50 mg via INTRAVENOUS

## 2019-06-26 MED ORDER — FENTANYL CITRATE (PF) 100 MCG/2ML IJ SOLN
INTRAMUSCULAR | Status: DC | PRN
  Administered 2019-06-26 (×2): 50 ug via INTRAVENOUS

## 2019-06-26 MED ORDER — LACTATED RINGERS IV SOLN
INTRAVENOUS | Status: DC | PRN
  Administered 2019-06-26: 15:00:00 via INTRAVENOUS

## 2019-06-26 MED ORDER — SUGAMMADEX SODIUM 200 MG/2ML IV SOLN
INTRAVENOUS | Status: DC | PRN
  Administered 2019-06-26: 150 mg via INTRAVENOUS

## 2019-06-26 MED ORDER — INDOMETHACIN 50 MG RE SUPP
RECTAL | Status: AC
  Filled 2019-06-26: qty 2

## 2019-06-26 MED ORDER — ROCURONIUM BROMIDE 100 MG/10ML IV SOLN
INTRAVENOUS | Status: DC | PRN
  Administered 2019-06-26: 50 mg via INTRAVENOUS

## 2019-06-26 MED ORDER — DEXAMETHASONE SODIUM PHOSPHATE 4 MG/ML IJ SOLN
INTRAMUSCULAR | Status: DC | PRN
  Administered 2019-06-26: 5 mg via INTRAVENOUS

## 2019-06-26 MED ORDER — LACTATED RINGERS IV SOLN
INTRAVENOUS | Status: DC
  Administered 2019-06-26: 17:00:00 via INTRAVENOUS

## 2019-06-26 MED ORDER — CIPROFLOXACIN IN D5W 400 MG/200ML IV SOLN
INTRAVENOUS | Status: AC
  Filled 2019-06-26: qty 200

## 2019-06-26 MED ORDER — ENOXAPARIN SODIUM 40 MG/0.4ML ~~LOC~~ SOLN
40.0000 mg | SUBCUTANEOUS | Status: DC
  Administered 2019-06-27: 40 mg via SUBCUTANEOUS
  Filled 2019-06-26: qty 0.4

## 2019-06-26 MED ORDER — INDOMETHACIN 50 MG RE SUPP
RECTAL | Status: DC | PRN
  Administered 2019-06-26: 100 mg via RECTAL

## 2019-06-26 MED ORDER — LIDOCAINE HCL (CARDIAC) PF 100 MG/5ML IV SOSY
PREFILLED_SYRINGE | INTRAVENOUS | Status: DC | PRN
  Administered 2019-06-26: 60 mg via INTRAVENOUS

## 2019-06-26 MED ORDER — SODIUM CHLORIDE 0.9 % IV SOLN
INTRAVENOUS | Status: DC | PRN
  Administered 2019-06-26: 15:00:00 40 mL

## 2019-06-26 NOTE — Brief Op Note (Signed)
06/24/2019 - 06/26/2019  3:16 PM  PATIENT:  Nancy Webster  85 y.o. female  PRE-OPERATIVE DIAGNOSIS:  CBD stone  POST-OPERATIVE DIAGNOSIS:  * No post-op diagnosis entered *  PROCEDURE:  Procedure(s): ENDOSCOPIC RETROGRADE CHOLANGIOPANCREATOGRAPHY (ERCP) (N/A)  SURGEON:  Surgeon(s) and Role:    [name], MD - Primary  PHYSICIAN ASSISTANT:   ASSISTANTS: Mosetta Putt, Tech  ANESTHESIA:   MAC  EBL:  none  BLOOD ADMINISTERED:none  DRAINS: none   LOCAL MEDICATIONS USED:  NONE  SPECIMEN:  No Specimen  DISPOSITION OF SPECIMEN:  N/A  COUNTS:  YES  TOURNIQUET:  * No tourniquets in log *  DICTATION: .Dragon Dictation  PLAN OF CARE: Admit to inpatient   PATIENT DISPOSITION:  PACU - hemodynamically stable.   Delay start of Pharmacological VTE agent (>24hrs) due to surgical blood loss or risk of bleeding: no

## 2019-06-26 NOTE — Op Note (Signed)
[facility] Patient Name: Nancy Webster Procedure Date : 06/26/2019 MRN: [id number] Attending MD: [name] , MD Date of Birth: 02/28/1928 CSN: [id number] Age: 85 Admit Type: Inpatient Procedure:                ERCP Indications:              Common bile duct stone(s), Abnormal MRCP, Elevated                            liver enzymes, For therapy of acute pancreatitis Providers:                [name], MD, Josie Dixon, RN, Jeanella Cara, RN, Lina Sar, Technician, Elspeth Cho Tech., Technician, Gala Lewandowsky, CRNA Referring MD:              Medicines:                Monitored Anesthesia Care Complications:            No immediate complications. Estimated blood loss:                            Minimal. Estimated Blood Loss:     Estimated blood loss was minimal. Procedure:                Pre-Anesthesia Assessment:                           - Prior to the procedure, a History and Physical                            was performed, and patient medications and                            allergies were reviewed. The patient's tolerance of                            previous anesthesia was also reviewed. The risks                            and benefits of the procedure and the sedation                            options and risks were discussed with the patient.                            All questions were answered, and informed consent                            was obtained. Prior Anticoagulants: The patient has  taken Lovenox (enoxaparin), last dose was day of                            procedure. ASA Grade Assessment: II - A patient                            with mild systemic disease. After reviewing the                            risks and benefits, the patient was deemed in                            satisfactory condition to undergo the procedure.                            After obtaining informed consent, the scope was                            passed under direct vision. Throughout the                            procedure, the patient's blood pressure, pulse, and                            oxygen saturations were monitored continuously. The                            [id number] Olympus duodenoscope was                            introduced through the mouth, and used to inject                            contrast into and used to inject contrast into the                            bile duct. The ERCP was accomplished without                            difficulty. The patient tolerated the procedure                            well. Scope In: Scope Out: Findings:      The scout film was normal. The esophagus was successfully intubated       under direct vision. The scope was advanced to a normal major papilla in       the descending duodenum without detailed examination of the pharynx,       larynx and associated structures, and upper GI tract. The upper GI tract       was grossly normal.      Small ulcer was noted at the ampulla, could be from recent passed CBD       stone.      The bile duct was readily, deeply cannulated with the sphincterotome.  Contrast was injected. I personally interpreted the bile duct images.       There was brisk flow of contrast through the ducts. Image quality was       excellent. Contrast extended to the entire biliary tree.      The main bile duct was moderately dilated. The largest diameter was 10       mm. The right and left intrahepatic ducts also appeared minimally       dilated.      The lower third of the main bile duct contained filling defect(s)       thought to be air bubble or stone.      A straight Roadrunner wire was passed into the biliary tree. A 12 mm       biliary sphincterotomy was made with a braided sphincterotome using ERBE       electrocautery. There was no post-sphincterotomy  bleeding.      The biliary tree was swept with a 9/12 mm and then with a 15 mm balloon       starting at the bifurcation. Nothing was found.      15 mm balloon could easily be retrieved through the ampulla post       sphincterotomy in an inflated position.      After multiple sweeps, obstruct active cholangiogram did not reveal any       further filling defects.      Good bile drainage was noted post enterotomy and balloon sweeps.      The pancreatic duct was intentionally not cannulated or injected during       the entire procedure. Impression:               - Ulcer noted at the ampulla likley from passed CBD                            stone.                           - The entire main bile duct was moderately dilated.                           - A biliary sphincterotomy was performed.                           - The biliary tree was swept and nothing was found. Moderate Sedation:      Patient did not receive moderate sedation for this procedure, but       instead received monitored anesthesia care. Recommendation:           - Clear liquid diet.                           - Advance diet as tolerated.                           - Check liver enzymes (AST, ALT, alkaline                            phosphatase, bilirubin) tomorrow. Procedure Code(s):        --- Professional ---  43262, Endoscopic retrograde                            cholangiopancreatography (ERCP); with                            sphincterotomy/papillotomy Diagnosis Code(s):        --- Professional ---                           R93.2, Abnormal findings on diagnostic imaging of                            liver and biliary tract                           K80.50, Calculus of bile duct without cholangitis                            or cholecystitis without obstruction                           R74.8, Abnormal levels of other serum enzymes                           K85.90, Acute pancreatitis without  necrosis or                            infection, unspecified                           K83.8, Other specified diseases of biliary tract CPT copyright 2019 American Medical Association. All rights reserved. The codes documented in this report are preliminary and upon coder review may  be revised to meet current compliance requirements. [name], MD 06/26/2019 3:26:43 PM This report has been signed electronically. Number of Addenda: 0

## 2019-06-26 NOTE — Anesthesia Preprocedure Evaluation (Addendum)
Anesthesia Evaluation  Patient identified by MRN, date of birth, ID band Patient awake    Reviewed: Allergy & Precautions, NPO status , Patient's Chart, lab work & pertinent test results, reviewed documented beta blocker date and time   Airway Mallampati: II  TM Distance: >3 FB Neck ROM: Full    Dental  (+) Teeth Intact, Dental Advisory Given, Caps   Pulmonary neg pulmonary ROS,    Pulmonary exam normal breath sounds clear to auscultation       Cardiovascular hypertension, Pt. on home beta blockers and Pt. on medications + CAD  Normal cardiovascular exam Rhythm:Regular Rate:Normal     Neuro/Psych PSYCHIATRIC DISORDERS Anxiety  Neuromuscular disease    GI/Hepatic negative GI ROS, CBD stone   Endo/Other  negative endocrine ROSdiabetes  Renal/GU negative Renal ROS     Musculoskeletal negative musculoskeletal ROS (+)   Abdominal   Peds  Hematology negative hematology ROS (+)   Anesthesia Other Findings Day of surgery medications reviewed with the patient.  Reproductive/Obstetrics                            Anesthesia Physical Anesthesia Plan  ASA: III  Anesthesia Plan: General   Post-op Pain Management:    Induction: Intravenous  PONV Risk Score and Plan: 3 and Ondansetron and Dexamethasone  Airway Management Planned: Oral ETT  Additional Equipment:   Intra-op Plan:   Post-operative Plan: Extubation in OR  Informed Consent: I have reviewed the patients History and Physical, chart, labs and discussed the procedure including the risks, benefits and alternatives for the proposed anesthesia with the patient or authorized representative who has indicated his/her understanding and acceptance.     Dental advisory given  Plan Discussed with: CRNA  Anesthesia Plan Comments:         Anesthesia Quick Evaluation

## 2019-06-26 NOTE — Transfer of Care (Signed)
Immediate Anesthesia Transfer of Care Note  Patient: Nancy Webster  Procedure(s) Performed: ENDOSCOPIC RETROGRADE CHOLANGIOPANCREATOGRAPHY (ERCP) (N/A ) REMOVAL OF STONES SPHINCTEROTOMY  Patient Location: PACU  Anesthesia Type:General  Level of Consciousness: awake, alert , oriented and patient cooperative  Airway & Oxygen Therapy: Patient Spontanous Breathing  Post-op Assessment: Report given to RN and Post -op Vital signs reviewed and stable  Post vital signs: Reviewed and stable  Last Vitals:  Vitals Value Taken Time  BP 154/51 06/26/19 1543  Temp 36.3 C 06/26/19 1541  Pulse 88 06/26/19 1544  Resp 22 06/26/19 1544  SpO2 93 % 06/26/19 1544  Vitals shown include unvalidated device data.  Last Pain:  Vitals:   06/26/19 1541  TempSrc: Temporal  PainSc: 0-No pain         Complications: No apparent anesthesia complications

## 2019-06-26 NOTE — Plan of Care (Signed)

## 2019-06-26 NOTE — Interval H&P Note (Signed)
History and Physical Interval Note: 89/female with CBD stone on MRCP at ampulla,elevated LFTs, pancreatitis, post cholecystectomy for an ERCP today.  06/26/2019 1:43 PM  Nancy Webster  has presented today for ERCP, with the diagnosis of CBD stone.  The various methods of treatment have been discussed with the patient and family. After consideration of risks, benefits and other options for treatment, the patient has consented to  Procedure(s): ENDOSCOPIC RETROGRADE CHOLANGIOPANCREATOGRAPHY (ERCP) (N/A) as a surgical intervention.  The patient's history has been reviewed, patient examined, no change in status, stable for surgery.  I have reviewed the patient's chart and labs.  Questions were answered to the patient's satisfaction.     [name]

## 2019-06-26 NOTE — Anesthesia Procedure Notes (Addendum)
Procedure Name: Intubation Date/Time: 06/26/2019 2:43 PM Performed by: Oletta Lamas, CRNA Pre-anesthesia Checklist: Patient identified, Emergency Drugs available, Suction available and Patient being monitored Patient Re-evaluated:Patient Re-evaluated prior to induction Oxygen Delivery Method: Circle System Utilized Preoxygenation: Pre-oxygenation with 100% oxygen Induction Type: IV induction Ventilation: Mask ventilation without difficulty Laryngoscope Size: Miller and 2 Grade View: Grade II Tube type: Oral Number of attempts: 1 Airway Equipment and Method: Stylet and Oral airway Placement Confirmation: ETT inserted through vocal cords under direct vision,  positive ETCO2 and breath sounds checked- equal and bilateral Tube secured with: Tape Dental Injury: Teeth and Oropharynx as per pre-operative assessment and Injury to lip  Comments: 43mm superficial laceration to upper lip during laryngoscopy

## 2019-06-26 NOTE — Progress Notes (Signed)
PROGRESS NOTE  Nancy Webster [id number] DOB: [birth year]   PCP: Lajean Manes, MD  Patient is from: Home  DOA: 06/24/2019 LOS: 2  Brief Narrative / Interim history: 85 year old female with history of DM-2, CAD, HTN, CKD-3, cholecystectomy in 02/2018 and anxiety presenting with acute upper abdominal pain for 1 day and admitted with acute pancreatitis.   In ED, hemodynamically stable.  Liver enzymes, total bili and lipase elevated.  CT abdomen and pelvis revealed uncomplicated pancreatitis with intrahepatic biliary duct dilation suspicious for choledocholithiasis.  Patient was admitted.  MRCP concerning for choledocholithiasis.  GI consulted.  Patient to have ERCP.  Assessment & Plan: Acute uncomplicated pancreatitis in the setting of choledocholithiasis -Patient presented with acute upper abdominal pain.   -CT abdomen reveals acute uncomplicated pancreatitis with intrahepatic biliary duct dilation suspicious for choledocholithiasis.  -MRCP confirms choledocholithiasis. -Calcium within normal range.  No hypertriglyceridemia or lipid panel. -LFT and bilirubin elevated but improving. -Lipase uptrending despite improvement in her pain. -Continue meropenem, bowel rest, pain control, PPI and IV fluids. -ERCP today by GI.  Elevated liver enzymes/hyperbilirubinemia/elevated alkaline phosphatase: Likely due to the above.  Improving. -Continue trending  Leukocytosis:Likely due to #1.  Blood cultures negative.  Improving. -Meropenem as above -Continue trending.  History of CAD: No anginal symptoms. -Continue metoprolol. -Hold benazepril and Imdur in the setting of soft blood pressure.  HTN: Normotensive for most part but soft blood pressure this morning. -IV fluids and cardiac meds as above.  NIDDM-2 with hyperglycemia.: On metformin at home. -CBG monitoring and sliding scale insulin -Hold statin in the setting of elevated liver enzymes.  CKD-3: Renal function improved. -Continue  monitoring  Anxiety: Stable -Continue home medications.  DVT prophylaxis: Subcu Lovenox Code Status: Full code Family Communication: Updated patient's daughter over the phone on 8/26. Disposition Plan: Remains inpatient for acute pancreatitis/choledocholithiasis Consultants: GI  Procedures:  Plan for ERCP today.  Microbiology summarized: [id number] negative. Blood cultures negative.  Antimicrobials: Anti-infectives (From admission, onward)   Start     Dose/Rate Route Frequency Ordered Stop   06/24/19 2200  meropenem (MERREM) 1 g in sodium chloride 0.9 % 100 mL IVPB     1 g 200 mL/hr over 30 Minutes Intravenous Every 12 hours 06/24/19 2126        Sch Meds:  Scheduled Meds: . [START ON 06/27/2019] enoxaparin (LOVENOX) injection  40 mg Subcutaneous Q24H  . gabapentin  100 mg Oral QHS  . insulin aspart  0-9 Units Subcutaneous TID WC  . metoprolol succinate  25 mg Oral Daily  . multivitamin with minerals  1 tablet Oral Daily  . pantoprazole  40 mg Oral Daily  . sodium chloride flush  3 mL Intravenous Once   Continuous Infusions: . sodium chloride 1,000 mL (06/24/19 2221)  . sodium chloride 75 mL/hr at 06/26/19 0828  . meropenem (MERREM) IV 1 g (06/26/19 0830)   PRN Meds:.sodium chloride, ALPRAZolam, HYDROmorphone (DILAUDID) injection, ondansetron, oxyCODONE, sodium chloride   Subjective: No major events overnight of this morning.  Reports improvement in his symptoms except for mild epigastric discomfort.  She is up walking with nurse tech.  Denies nausea or vomiting.  Denies melena or hematochezia.  Denies chest pain or dyspnea. Objective: Vitals:   06/26/19 0553 06/26/19 0822 06/26/19 1206 06/26/19 1334  BP:  122/64 (!) 137/55 139/60  Pulse:  81 84 88  Resp:  18 18 13   Temp:  97.7 F (36.5 C) 97.9 F (36.6 C) 98 F (36.7 C)  TempSrc:  Oral  Oral Temporal  SpO2:  97% 95% 96%  Weight: 68.2 kg   68 kg  Height:    5\' 3"  (1.6 m)    Intake/Output Summary (Last 24  hours) at 06/26/2019 1409 Last data filed at 06/26/2019 1216 Gross per 24 hour  Intake 440 ml  Output 801 ml  Net -361 ml   Filed Weights   06/25/19 0419 06/26/19 0553 06/26/19 1334  Weight: 68.6 kg 68.2 kg 68 kg    Examination:  GENERAL: No acute distress.  Up walking in the room. HEENT: MMM.  Vision and hearing grossly intact.  NECK: Supple.  No apparent JVD.  RESP:  No IWOB. Good air movement bilaterally. CVS:  RRR. Heart sounds normal.  ABD/GI/GU: Bowel sounds present. Soft.  Mild epigastric discomfort with palpation. MSK/EXT:  Moves extremities. No apparent deformity or edema.  SKIN: no apparent skin lesion or wound NEURO: Awake, alert and oriented appropriately.  No gross deficit.  PSYCH: Calm. Normal affect.   I have personally reviewed the following labs and images: CBC: Recent Labs  Lab 06/24/19 1207 06/24/19 2207 06/26/19 0545  WBC 25.5* 28.5* 15.7*  HGB 12.9 12.9 11.0*  HCT 40.3 37.5 33.2*  MCV 98.8 94.0 95.1  PLT 357 289 258   BMP &GFR Recent Labs  Lab 06/24/19 1207 06/24/19 2207 06/25/19 0323 06/26/19 0545  NA 134*  --  135 139  K 4.2  --  4.1 4.2  CL 96*  --  98 104  CO2 24  --  25 25  GLUCOSE 367*  --  271* 232*  BUN 21  --  23 28*  CREATININE 1.21* 1.20* 1.31* 1.09*  CALCIUM 9.5  --  8.8* 8.9  MG  --  1.5*  --  2.1  PHOS  --  2.2*  --  3.5   Estimated Creatinine Clearance: 32.4 mL/min (A) (by C-G formula based on SCr of 1.09 mg/dL (H)). Liver & Pancreas: Recent Labs  Lab 06/24/19 1645 06/25/19 0323 06/26/19 0545  AST 290* 162* 106*  ALT 301* 213* 146*  ALKPHOS 510* 379* 384*  BILITOT 6.4* 5.9* 5.7*  PROT 7.4 5.9* 5.6*  ALBUMIN 3.6 2.8* 2.5*   Recent Labs  Lab 06/24/19 1645 06/25/19 0323 06/26/19 0545  LIPASE 344* 814* 1,043*   No results for input(s): AMMONIA in the last 168 hours. Diabetic: Recent Labs    06/24/19 2207  HGBA1C 7.1*   Recent Labs  Lab 06/25/19 1127 06/25/19 1614 06/25/19 2147 06/26/19 0603 06/26/19  1202  GLUCAP 226* 203* 220* 223* 222*   Cardiac Enzymes: No results for input(s): CKTOTAL, CKMB, CKMBINDEX, TROPONINI in the last 168 hours. No results for input(s): PROBNP in the last 8760 hours. Coagulation Profile: Recent Labs  Lab 06/24/19 2207  INR 1.3*   Thyroid Function Tests: Recent Labs    06/24/19 2207  TSH 1.287  1.286   Lipid Profile: Recent Labs    06/24/19 2207  CHOL 193  HDL 48  LDLCALC 119*  TRIG 130  CHOLHDL 4.0   Anemia Panel: No results for input(s): VITAMINB12, FOLATE, FERRITIN, TIBC, IRON, RETICCTPCT in the last 72 hours. Urine analysis: No results found for: COLORURINE, APPEARANCEUR, LABSPEC, PHURINE, GLUCOSEU, HGBUR, BILIRUBINUR, KETONESUR, PROTEINUR, UROBILINOGEN, NITRITE, LEUKOCYTESUR Sepsis Labs: Invalid input(s): PROCALCITONIN, [place]  Microbiology: Recent Results (from the past 240 hour(s))  SARS CORONAVIRUS 2 (TAT 6-12 HRS) Nasal Swab Aptima Multi Swab     Status: None   Collection Time: 06/24/19  6:26 PM   Specimen:  Aptima Multi Swab; Nasal Swab  Result Value Ref Range Status   SARS Coronavirus 2 NEGATIVE NEGATIVE Final    Comment: (NOTE) SARS-CoV-2 target nucleic acids are NOT DETECTED. The SARS-CoV-2 RNA is generally detectable in upper and lower respiratory specimens during the acute phase of infection. Negative results do not preclude SARS-CoV-2 infection, do not rule out co-infections with other pathogens, and should not be used as the sole basis for treatment or other patient management decisions. Negative results must be combined with clinical observations, patient history, and epidemiological information. The expected result is Negative. Fact Sheet for Patients: SugarRoll.be Fact Sheet for Healthcare Providers: https://www.woods-mathews.com/ This test is not yet approved or cleared by the [place] FDA and  has been authorized for detection and/or diagnosis of SARS-CoV-2  by FDA under an Emergency Use Authorization (EUA). This EUA will remain  in effect (meaning this test can be used) for the duration of the COVID-19 declaration under Section 56 4(b)(1) of the Act, 21 U.S.C. section 360bbb-3(b)(1), unless the authorization is terminated or revoked sooner. Performed at [facility], [place] [address] [place], [place] [address]   Culture, blood (routine x 2)     Status: None (Preliminary result)   Collection Time: 06/24/19 10:06 PM   Specimen: BLOOD  Result Value Ref Range Status   Specimen Description BLOOD RIGHT ANTECUBITAL  Final   Special Requests   Final    BOTTLES DRAWN AEROBIC AND ANAEROBIC Blood Culture adequate volume   Culture   Final    NO GROWTH 2 DAYS Performed at [facility], [place] [address], [place], [place] [address]    Report Status PENDING  Incomplete  Culture, blood (routine x 2)     Status: None (Preliminary result)   Collection Time: 06/24/19 10:14 PM   Specimen: BLOOD RIGHT FOREARM  Result Value Ref Range Status   Specimen Description BLOOD RIGHT FOREARM  Final   Special Requests   Final    BOTTLES DRAWN AEROBIC ONLY Blood Culture adequate volume   Culture   Final    NO GROWTH 2 DAYS Performed at [facility], [place] [address], [place], [place] [address]    Report Status PENDING  Incomplete    Radiology Studies: No results found.   Earon Rivest T. [place]  If 7PM-7AM, please contact night-coverage [email or web address] Password [email or web address] 06/26/2019, 2:09 PM

## 2019-06-27 ENCOUNTER — Encounter (HOSPITAL_COMMUNITY): Payer: Self-pay | Admitting: Gastroenterology

## 2019-06-27 DIAGNOSIS — R7881 Bacteremia: Secondary | ICD-10-CM

## 2019-06-27 LAB — GLUCOSE, CAPILLARY
Glucose-Capillary: 364 mg/dL — ABNORMAL HIGH (ref 70–99)
Glucose-Capillary: 415 mg/dL — ABNORMAL HIGH (ref 70–99)
Glucose-Capillary: 343 mg/dL — ABNORMAL HIGH (ref 70–99)
Glucose-Capillary: 360 mg/dL — ABNORMAL HIGH (ref 70–99)
Glucose-Capillary: 203 mg/dL — ABNORMAL HIGH (ref 70–99)
Glucose-Capillary: 176 mg/dL — ABNORMAL HIGH (ref 70–99)

## 2019-06-27 LAB — MAGNESIUM: Magnesium: 2.2 mg/dL (ref 1.7–2.4)

## 2019-06-27 LAB — BLOOD CULTURE ID PANEL (REFLEXED)

## 2019-06-27 LAB — CBC
HCT: 34.9 % — ABNORMAL LOW (ref 36.0–46.0)
Hemoglobin: 11.6 g/dL — ABNORMAL LOW (ref 12.0–15.0)
MCH: 32.1 pg (ref 26.0–34.0)
MCHC: 33.2 g/dL (ref 30.0–36.0)
MCV: 96.7 fL (ref 80.0–100.0)
Platelets: 269 10*3/uL (ref 150–400)
RBC: 3.61 MIL/uL — ABNORMAL LOW (ref 3.87–5.11)
RDW: 14.3 % (ref 11.5–15.5)
WBC: 11.7 10*3/uL — ABNORMAL HIGH (ref 4.0–10.5)
nRBC: 0 % (ref 0.0–0.2)

## 2019-06-27 LAB — COMPREHENSIVE METABOLIC PANEL
ALT: 121 U/L — ABNORMAL HIGH (ref 0–44)
AST: 69 U/L — ABNORMAL HIGH (ref 15–41)
Albumin: 2.3 g/dL — ABNORMAL LOW (ref 3.5–5.0)
Alkaline Phosphatase: 390 U/L — ABNORMAL HIGH (ref 38–126)
Anion gap: 9 (ref 5–15)
BUN: 40 mg/dL — ABNORMAL HIGH (ref 8–23)
CO2: 25 mmol/L (ref 22–32)
Calcium: 8.6 mg/dL — ABNORMAL LOW (ref 8.9–10.3)
Chloride: 104 mmol/L (ref 98–111)
Creatinine, Ser: 1.47 mg/dL — ABNORMAL HIGH (ref 0.44–1.00)
GFR calc Af Amer: 36 mL/min — ABNORMAL LOW (ref >60)
GFR calc non Af Amer: 31 mL/min — ABNORMAL LOW (ref >60)
Glucose, Bld: 365 mg/dL — ABNORMAL HIGH (ref 70–99)
Potassium: 5 mmol/L (ref 3.5–5.1)
Sodium: 138 mmol/L (ref 135–145)
Total Bilirubin: 3.7 mg/dL — ABNORMAL HIGH (ref 0.3–1.2)
Total Protein: 5.4 g/dL — ABNORMAL LOW (ref 6.5–8.1)

## 2019-06-27 LAB — LIPASE, BLOOD: Lipase: 286 U/L — ABNORMAL HIGH (ref 11–51)

## 2019-06-27 MED ORDER — INSULIN ASPART 100 UNIT/ML ~~LOC~~ SOLN
5.0000 [IU] | Freq: Three times a day (TID) | SUBCUTANEOUS | Status: DC
  Administered 2019-06-27 – 2019-06-28 (×2): 5 [IU] via SUBCUTANEOUS

## 2019-06-27 MED ORDER — ENOXAPARIN SODIUM 30 MG/0.3ML ~~LOC~~ SOLN
30.0000 mg | SUBCUTANEOUS | Status: DC
  Administered 2019-06-28: 30 mg via SUBCUTANEOUS
  Filled 2019-06-27 (×2): qty 0.3

## 2019-06-27 MED ORDER — INSULIN ASPART 100 UNIT/ML ~~LOC~~ SOLN
0.0000 [IU] | Freq: Three times a day (TID) | SUBCUTANEOUS | Status: DC
  Administered 2019-06-27: 15 [IU] via SUBCUTANEOUS
  Administered 2019-06-27 – 2019-06-28 (×2): 3 [IU] via SUBCUTANEOUS

## 2019-06-27 MED ORDER — SODIUM CHLORIDE 0.9 % IV SOLN
2.0000 g | INTRAVENOUS | Status: DC
  Administered 2019-06-28: 2 g via INTRAVENOUS
  Filled 2019-06-27: qty 2

## 2019-06-27 MED ORDER — INSULIN ASPART 100 UNIT/ML ~~LOC~~ SOLN: 0.0000 [IU] | Freq: Every day | SUBCUTANEOUS | Status: DC

## 2019-06-27 MED ORDER — SODIUM CHLORIDE 0.9 % IV SOLN
2.0000 g | INTRAVENOUS | Status: DC
  Administered 2019-06-27: 2 g via INTRAVENOUS
  Filled 2019-06-27: qty 2

## 2019-06-27 MED ORDER — INSULIN DETEMIR 100 UNIT/ML ~~LOC~~ SOLN
10.0000 [IU] | Freq: Every day | SUBCUTANEOUS | Status: DC
  Administered 2019-06-27: 10 [IU] via SUBCUTANEOUS
  Filled 2019-06-27 (×2): qty 0.1

## 2019-06-27 NOTE — Progress Notes (Signed)
PHARMACY - PHYSICIAN COMMUNICATION CRITICAL VALUE ALERT - BLOOD CULTURE IDENTIFICATION (BCID)  Nancy Webster is an 85 y.o. female who presented to [facility] on 06/24/2019 with a chief complaint of pancreatitis   Assessment:  WBC was markedly elevated but is now trending down with Merrem, afebrile  Name of physician (or Provider) Contacted: [place] ([facility])  Current antibiotics: Merrem  Changes to prescribed antibiotics recommended:  Cont Merrem  Results for orders placed or performed during the hospital encounter of 06/24/19  Blood Culture ID Panel (Reflexed) (Collected: 06/24/2019 10:06 PM)  Result Value Ref Range   Enterococcus species NOT DETECTED NOT DETECTED   Listeria monocytogenes NOT DETECTED NOT DETECTED   Staphylococcus species NOT DETECTED NOT DETECTED   Staphylococcus aureus (BCID) NOT DETECTED NOT DETECTED   Streptococcus species NOT DETECTED NOT DETECTED   Streptococcus agalactiae NOT DETECTED NOT DETECTED   Streptococcus pneumoniae NOT DETECTED NOT DETECTED   Streptococcus pyogenes NOT DETECTED NOT DETECTED   Acinetobacter baumannii NOT DETECTED NOT DETECTED   Enterobacteriaceae species DETECTED (A) NOT DETECTED   Enterobacter cloacae complex NOT DETECTED NOT DETECTED   Escherichia coli DETECTED (A) NOT DETECTED   Klebsiella oxytoca NOT DETECTED NOT DETECTED   Klebsiella pneumoniae NOT DETECTED NOT DETECTED   Proteus species NOT DETECTED NOT DETECTED   Serratia marcescens NOT DETECTED NOT DETECTED   Carbapenem resistance NOT DETECTED NOT DETECTED   Haemophilus influenzae NOT DETECTED NOT DETECTED   Neisseria meningitidis NOT DETECTED NOT DETECTED   Pseudomonas aeruginosa NOT DETECTED NOT DETECTED   Candida albicans NOT DETECTED NOT DETECTED   Candida glabrata NOT DETECTED NOT DETECTED   Candida krusei NOT DETECTED NOT DETECTED   Candida parapsilosis NOT DETECTED NOT DETECTED   Candida tropicalis NOT DETECTED NOT DETECTED    Narda Bonds 06/27/2019  1:10  AM

## 2019-06-27 NOTE — Progress Notes (Signed)
PROGRESS NOTE  Nancy Webster [id number] DOB: 1928/09/24   PCP: Lajean Manes, MD  Patient is from: Home  DOA: 06/24/2019 LOS: 3  Brief Narrative / Interim history: 85 year old female with history of DM-2, CAD, HTN, CKD-3, cholecystectomy in 02/2018 and anxiety presenting with acute upper abdominal pain for 1 day and admitted with acute pancreatitis.   In ED, hemodynamically stable.  Liver enzymes, total bili and lipase elevated.  CT abdomen and pelvis revealed uncomplicated pancreatitis with intrahepatic biliary duct dilation suspicious for choledocholithiasis.  Patient was admitted.  MRCP concerning for choledocholithiasis.  GI consulted.  Blood culture with E. Coli..  Patient had ERCP on 8/27 that revealed ulcer at the ampulla likely from past CBD stone and moderate dilation entire bile duct.  Had biliary sphincterectomy and biliary tree sweeping.  Assessment & Plan: Acute uncomplicated pancreatitis in the setting of choledocholithiasis -Patient presented with acute upper abdominal pain that has improved significantly. -CT AP-acute uncomplicated pancreatitis with intrahepatic biliary duct dilation suspicious for CBD stone -MRCP confirms choledocholithiasis.  -ERCP as above. -Calcium within normal range.  No hypertriglyceridemia or lipid panel. -LFT and bilirubin elevated but improving. -Lipase up trended-recheck today. -Meropenem 8/26-8/28.  -Ceftriaxone 8/28>> blood culture E. coli -Pain control, PPI and IV fluids. -Clear liquid diet.  E. coli bacteremia: Blood culture grew E. coli.  Likely due to the above.  Leukocytosis resolving. -Antibiotics as above -Repeat blood culture -Follow culture sensitivities  Elevated liver enzymes/hyperbilirubinemia/elevated alkaline phosphatase: Likely due to the above.  Improving. -Continue trending  Leukocytosis:Likely due to #1 and 3.  Blood cultures negative.  Improving. -Antibiotics as above. -Continue trending.  History of CAD: No  anginal symptoms. -Continue metoprolol. -Hold benazepril and Imdur in the setting of soft blood pressure.  HTN: Normotensive for most part but soft blood pressure this morning. -IV fluids and cardiac meds as above.  NIDDM-2 with hyperglycemia.: On metformin at home.  CBG elevated -Levemir, mealtime insulin and SSI -Hold statin in the setting of elevated liver enzymes.  CKD-3: Up to 1.47 after initial improvement.  Had previously elevated values -Continue IV fluids -Avoid nephrotoxic meds.  Anxiety: Stable -Continue home medications.  DVT prophylaxis: Subcu Lovenox Code Status: Full code Family Communication: Per patient and RN.  Available if any question. Disposition Plan: Remains inpatient for acute pancreatitis/E. coli bacteremia Consultants: GI  Procedures:  Plan for ERCP today.  Microbiology summarized: 8/25 COVID-19 negative. 8/25 blood cultures E. Coli 8/28 blood cultures pending  Antimicrobials: Anti-infectives (From admission, onward)   Start     Dose/Rate Route Frequency Ordered Stop   06/27/19 1000  cefTRIAXone (ROCEPHIN) 2 g in sodium chloride 0.9 % 100 mL IVPB     2 g 200 mL/hr over 30 Minutes Intravenous Every 24 hours 06/27/19 0954     06/24/19 2200  meropenem (MERREM) 1 g in sodium chloride 0.9 % 100 mL IVPB  Status:  Discontinued     1 g 200 mL/hr over 30 Minutes Intravenous Every 12 hours 06/24/19 2126 06/27/19 0954      Sch Meds:  Scheduled Meds: . [START ON 06/28/2019] enoxaparin (LOVENOX) injection  30 mg Subcutaneous Q24H  . gabapentin  100 mg Oral QHS  . insulin aspart  0-15 Units Subcutaneous TID WC  . insulin aspart  0-5 Units Subcutaneous QHS  . insulin detemir  10 Units Subcutaneous QHS  . metoprolol succinate  25 mg Oral Daily  . multivitamin with minerals  1 tablet Oral Daily  . pantoprazole  40 mg Oral  Daily  . sodium chloride flush  3 mL Intravenous Once   Continuous Infusions: . sodium chloride 1,000 mL (06/24/19 2221)  .  cefTRIAXone (ROCEPHIN)  IV 2 g (06/27/19 1241)  . lactated ringers 100 mL/hr at 06/26/19 1656   PRN Meds:.sodium chloride, ALPRAZolam, HYDROmorphone (DILAUDID) injection, ondansetron, oxyCODONE, sodium chloride   Subjective: No major events overnight of this morning.  She had ERCP yesterday.  No complaint this morning.  Abdominal pain almost resolved.  Denies nausea or vomiting.  Denies chest pain, dyspnea or urinary symptoms.  Objective: Vitals:   06/27/19 0440 06/27/19 0635 06/27/19 0840 06/27/19 1306  BP: 125/83  (!) 128/59 (!) 104/59  Pulse: 68  70 73  Resp: 20  16 18   Temp: (!) 97.3 F (36.3 C)   97.8 F (36.6 C)  TempSrc: Oral   Oral  SpO2: 96%  97% 98%  Weight:  70.2 kg    Height:        Intake/Output Summary (Last 24 hours) at 06/27/2019 1408 Last data filed at 06/27/2019 1310 Gross per 24 hour  Intake 2984.76 ml  Output 300 ml  Net 2684.76 ml   Filed Weights   06/26/19 0553 06/26/19 1334 06/27/19 0635  Weight: 68.2 kg 68 kg 70.2 kg    Examination:  GENERAL: No acute distress.  Appears well.  HEENT: MMM.  Vision and hearing grossly intact.  NECK: Supple.  No apparent JVD.  RESP:  No IWOB. Good air movement bilaterally. CVS:  RRR. Heart sounds normal.  ABD/GI/GU: Bowel sounds present. Soft. Non tender.  MSK/EXT:  Moves extremities. No apparent deformity or edema.  SKIN: no apparent skin lesion or wound NEURO: Awake, alert and oriented appropriately.  No gross deficit.  PSYCH: Calm. Normal affect.   I have personally reviewed the following labs and images: CBC: Recent Labs  Lab 06/24/19 1207 06/24/19 2207 06/26/19 0545 06/27/19 0421  WBC 25.5* 28.5* 15.7* 11.7*  HGB 12.9 12.9 11.0* 11.6*  HCT 40.3 37.5 33.2* 34.9*  MCV 98.8 94.0 95.1 96.7  PLT 357 289 258 269   BMP &GFR Recent Labs  Lab 06/24/19 1207 06/24/19 2207 06/25/19 0323 06/26/19 0545 06/27/19 0421  NA 134*  --  135 139 138  K 4.2  --  4.1 4.2 5.0  CL 96*  --  98 104 104  CO2 24  --   25 25 25   GLUCOSE 367*  --  271* 232* 365*  BUN 21  --  23 28* 40*  CREATININE 1.21* 1.20* 1.31* 1.09* 1.47*  CALCIUM 9.5  --  8.8* 8.9 8.6*  MG  --  1.5*  --  2.1 2.2  PHOS  --  2.2*  --  3.5  --    Estimated Creatinine Clearance: 24.4 mL/min (A) (by C-G formula based on SCr of 1.47 mg/dL (H)). Liver & Pancreas: Recent Labs  Lab 06/24/19 1645 06/25/19 0323 06/26/19 0545 06/27/19 0421  AST 290* 162* 106* 69*  ALT 301* 213* 146* 121*  ALKPHOS 510* 379* 384* 390*  BILITOT 6.4* 5.9* 5.7* 3.7*  PROT 7.4 5.9* 5.6* 5.4*  ALBUMIN 3.6 2.8* 2.5* 2.3*   Recent Labs  Lab 06/24/19 1645 06/25/19 0323 06/26/19 0545  LIPASE 344* 814* 1,043*   No results for input(s): AMMONIA in the last 168 hours. Diabetic: Recent Labs    06/24/19 2207  HGBA1C 7.1*   Recent Labs  Lab 06/26/19 1618 06/26/19 2204 06/26/19 2206 06/27/19 0608 06/27/19 1204  GLUCAP 207* 364* 415* 343* 360*  Cardiac Enzymes: No results for input(s): CKTOTAL, CKMB, CKMBINDEX, TROPONINI in the last 168 hours. No results for input(s): PROBNP in the last 8760 hours. Coagulation Profile: Recent Labs  Lab 06/24/19 2207  INR 1.3*   Thyroid Function Tests: Recent Labs    06/24/19 2207  TSH 1.287  1.286   Lipid Profile: Recent Labs    06/24/19 2207  CHOL 193  HDL 48  LDLCALC 119*  TRIG 130  CHOLHDL 4.0   Anemia Panel: No results for input(s): VITAMINB12, FOLATE, FERRITIN, TIBC, IRON, RETICCTPCT in the last 72 hours. Urine analysis: No results found for: COLORURINE, APPEARANCEUR, LABSPEC, PHURINE, GLUCOSEU, HGBUR, BILIRUBINUR, KETONESUR, PROTEINUR, UROBILINOGEN, NITRITE, LEUKOCYTESUR Sepsis Labs: Invalid input(s): PROCALCITONIN, [place]  Microbiology: Recent Results (from the past 240 hour(s))  SARS CORONAVIRUS 2 (TAT 6-12 HRS) Nasal Swab Aptima Multi Swab     Status: None   Collection Time: 06/24/19  6:26 PM   Specimen: Aptima Multi Swab; Nasal Swab  Result Value Ref Range Status   SARS  Coronavirus 2 NEGATIVE NEGATIVE Final    Comment: (NOTE) SARS-CoV-2 target nucleic acids are NOT DETECTED. The SARS-CoV-2 RNA is generally detectable in upper and lower respiratory specimens during the acute phase of infection. Negative results do not preclude SARS-CoV-2 infection, do not rule out co-infections with other pathogens, and should not be used as the sole basis for treatment or other patient management decisions. Negative results must be combined with clinical observations, patient history, and epidemiological information. The expected result is Negative. Fact Sheet for Patients: SugarRoll.be Fact Sheet for Healthcare Providers: https://www.woods-mathews.com/ This test is not yet approved or cleared by the [place] FDA and  has been authorized for detection and/or diagnosis of SARS-CoV-2 by FDA under an Emergency Use Authorization (EUA). This EUA will remain  in effect (meaning this test can be used) for the duration of the COVID-19 declaration under Section 56 4(b)(1) of the Act, 21 U.S.C. section 360bbb-3(b)(1), unless the authorization is terminated or revoked sooner. Performed at [facility], [place] [address], [place], [place] [address]   Culture, blood (routine x 2)     Status: Abnormal (Preliminary result)   Collection Time: 06/24/19 10:06 PM   Specimen: BLOOD  Result Value Ref Range Status   Specimen Description BLOOD RIGHT ANTECUBITAL  Final   Special Requests   Final    BOTTLES DRAWN AEROBIC AND ANAEROBIC Blood Culture adequate volume   Culture  Setup Time   Final    GRAM NEGATIVE RODS IN BOTH AEROBIC AND ANAEROBIC BOTTLES CRITICAL RESULT CALLED TO, READ BACK BY AND VERIFIED WITH: J LEDFORD PHARMD 06/27/19 0100 JDW    Culture (A)  Final    ESCHERICHIA COLI SUSCEPTIBILITIES TO FOLLOW Performed at [facility], [place] [address], [place], [place] [address]    Report Status PENDING  Incomplete  Blood  Culture ID Panel (Reflexed)     Status: Abnormal   Collection Time: 06/24/19 10:06 PM  Result Value Ref Range Status   Enterococcus species NOT DETECTED NOT DETECTED Final   Listeria monocytogenes NOT DETECTED NOT DETECTED Final   Staphylococcus species NOT DETECTED NOT DETECTED Final   Staphylococcus aureus (BCID) NOT DETECTED NOT DETECTED Final   Streptococcus species NOT DETECTED NOT DETECTED Final   Streptococcus agalactiae NOT DETECTED NOT DETECTED Final   Streptococcus pneumoniae NOT DETECTED NOT DETECTED Final   Streptococcus pyogenes NOT DETECTED NOT DETECTED Final   Acinetobacter baumannii NOT DETECTED NOT DETECTED Final   Enterobacteriaceae species DETECTED (  A) NOT DETECTED Final    Comment: Enterobacteriaceae represent a large family of gram-negative bacteria, not a single organism. CRITICAL RESULT CALLED TO, READ BACK BY AND VERIFIED WITH: J LEDFORD PHARMD 06/27/19 0100 JDW    Enterobacter cloacae complex NOT DETECTED NOT DETECTED Final   Escherichia coli DETECTED (A) NOT DETECTED Final    Comment: CRITICAL RESULT CALLED TO, READ BACK BY AND VERIFIED WITH: J LEDFORD PHARMD 06/27/19 0100 JDW    Klebsiella oxytoca NOT DETECTED NOT DETECTED Final   Klebsiella pneumoniae NOT DETECTED NOT DETECTED Final   Proteus species NOT DETECTED NOT DETECTED Final   Serratia marcescens NOT DETECTED NOT DETECTED Final   Carbapenem resistance NOT DETECTED NOT DETECTED Final   Haemophilus influenzae NOT DETECTED NOT DETECTED Final   Neisseria meningitidis NOT DETECTED NOT DETECTED Final   Pseudomonas aeruginosa NOT DETECTED NOT DETECTED Final   Candida albicans NOT DETECTED NOT DETECTED Final   Candida glabrata NOT DETECTED NOT DETECTED Final   Candida krusei NOT DETECTED NOT DETECTED Final   Candida parapsilosis NOT DETECTED NOT DETECTED Final   Candida tropicalis NOT DETECTED NOT DETECTED Final    Comment: Performed at [facility], [place]. [address], [place], [place] [address]   Culture, blood (routine x 2)     Status: None (Preliminary result)   Collection Time: 06/24/19 10:14 PM   Specimen: BLOOD RIGHT FOREARM  Result Value Ref Range Status   Specimen Description BLOOD RIGHT FOREARM  Final   Special Requests   Final    BOTTLES DRAWN AEROBIC ONLY Blood Culture adequate volume   Culture  Setup Time   Final    GRAM NEGATIVE RODS AEROBIC BOTTLE ONLY CRITICAL VALUE NOTED.  VALUE IS CONSISTENT WITH PREVIOUSLY REPORTED AND CALLED VALUE. Performed at [facility], [place] [address], [place], [place] [address]    Culture GRAM NEGATIVE RODS  Final   Report Status PENDING  Incomplete    Radiology Studies: Dg Ercp Biliary & Pancreatic Ducts  Result Date: 06/26/2019 CLINICAL DATA:  Common bile duct stone EXAM: ERCP TECHNIQUE: Multiple spot images obtained with the fluoroscopic device and submitted for interpretation post-procedure. COMPARISON:  MRCP from the previous day FINDINGS: A series of fluoroscopic spot images document endoscopic cannulation and opacification of the CBD. Passage of balloon catheter through the CBD. Incomplete opacification of the intrahepatic biliary tree, which appears decompressed centrally. No extravasation. IMPRESSION: Endoscopic CBD cannulation and intervention. These images were submitted for radiologic interpretation only. Please see the procedural report for the amount of contrast and the fluoroscopy time utilized. Electronically Signed   By: Lucrezia Europe M.D.   On: 06/26/2019 16:16     Taye T. [place]  If 7PM-7AM, please contact night-coverage [email or web address] Password [email or web address] 06/27/2019, 2:08 PM

## 2019-06-27 NOTE — Progress Notes (Addendum)
Inpatient Diabetes Program Recommendations  AACE/ADA: New Consensus Statement on Inpatient Glycemic Control (2015)  Target Ranges:  Prepandial:   less than 140 mg/dL      Peak postprandial:   less than 180 mg/dL (1-2 hours)      Critically ill patients:  140 - 180 mg/dL   Lab Results  Component Value Date   GLUCAP 343 (H) 06/27/2019   HGBA1C 7.1 (H) 06/24/2019    Review of Glycemic Control Results for BAYLY, YANTZ (MRN [id number]) as of 06/27/2019 09:02  Ref. Range 06/26/2019 22:04 06/26/2019 22:06 06/27/2019 06:08  Glucose-Capillary Latest Ref Range: 70 - 99 mg/dL 364 (H) 415 (H) 343 (H)   Diabetes history: Type 2 DM Outpatient Diabetes medications: Metformin 1000 mg BID Current orders for Inpatient glycemic control: Novolog 0-15 units TID, Novolog 0-5 units QHS, Levemir 10 units QHS Decadron 5 mg  x1   Inpatient Diabetes Program Recommendations:    Consider switching Levemir dose to this AM. Additionally, if patient to remain on clear liquids, would recommend changing correction to Novolog 0-15 units Q4H. Secure chat sent to MD.   Thanks, Bronson Curb, MSN, RNC-OB Diabetes Coordinator [phone] (8a-5p)

## 2019-06-27 NOTE — Care Management Important Message (Signed)
Important Message  Patient Details  Name: Nancy Webster MRN: [id number] Date of Birth: Feb 27, 1928   Medicare Important Message Given:  Yes     Shelda Altes 06/27/2019, 12:59 PM

## 2019-06-27 NOTE — Anesthesia Postprocedure Evaluation (Signed)
Anesthesia Post Note  Patient: Nancy Webster  Procedure(s) Performed: ENDOSCOPIC RETROGRADE CHOLANGIOPANCREATOGRAPHY (ERCP) (N/A ) SPHINCTEROTOMY     Patient location during evaluation: PACU Anesthesia Type: General Level of consciousness: awake and alert Pain management: pain level controlled Vital Signs Assessment: post-procedure vital signs reviewed and stable Respiratory status: spontaneous breathing, nonlabored ventilation, respiratory function stable and patient connected to nasal cannula oxygen Cardiovascular status: blood pressure returned to baseline and stable Postop Assessment: no apparent nausea or vomiting Anesthetic complications: no    Last Vitals:  Vitals:   06/27/19 1306 06/27/19 1645  BP: (!) 104/59 (!) 125/52  Pulse: 73 64  Resp: 18 16  Temp: 36.6 C 37 C  SpO2: 98% 98%    Last Pain:  Vitals:   06/27/19 1645  TempSrc: Oral  PainSc:                  Catalina Gravel

## 2019-06-27 NOTE — Progress Notes (Signed)
[facility] PROGRESS NOTE Subjective Patient had ERCP yesterday with sphincterotomy and clearance of the CBD.  WBC has declined and she feels terrific is tolerating clear liquids and wants something more to eat.  Objective: Vital signs in last 24 hours: Temp:  [97.3 F (36.3 C)-98.3 F (36.8 C)] 97.8 F (36.6 C) (08/28 1306) Pulse Rate:  [68-89] 73 (08/28 1306) Resp:  [10-25] 18 (08/28 1306) BP: (104-154)/(49-87) 104/59 (08/28 1306) SpO2:  [93 %-98 %] 98 % (08/28 1306) Weight:  [70.2 kg] 70.2 kg (08/28 0635) Last BM Date: 06/25/19  Intake/Output from previous day: 08/27 0701 - 08/28 0700 In: 2227.8 [P.O.:680; I.V.:1150.3; IV Piggyback:397.5] Out: 600 [Urine:600] Intake/Output this shift: Total I/O In: 957 [P.O.:957] Out: 300 [Urine:300]  PE: General--alert talkative in no distress Abdomen--soft completely nontender good bowel sounds  Lab Results: Recent Labs    06/24/19 2207 06/26/19 0545 06/27/19 0421  WBC 28.5* 15.7* 11.7*  HGB 12.9 11.0* 11.6*  HCT 37.5 33.2* 34.9*  PLT 289 258 269   BMET Recent Labs    06/24/19 2207 06/25/19 0323 06/26/19 0545 06/27/19 0421  NA  --  135 139 138  K  --  4.1 4.2 5.0  CL  --  98 104 104  CO2  --  25 25 25   CREATININE 1.20* 1.31* 1.09* 1.47*   LFT Recent Labs    06/24/19 1645 06/25/19 0323 06/26/19 0545 06/27/19 0421  PROT 7.4 5.9* 5.6* 5.4*  AST 290* 162* 106* 69*  ALT 301* 213* 146* 121*  ALKPHOS 510* 379* 384* 390*  BILITOT 6.4* 5.9* 5.7* 3.7*  BILIDIR 4.0*  --   --   --   IBILI 2.4*  --   --   --    PT/INR Recent Labs    06/24/19 2207  LABPROT 15.7*  INR 1.3*   PANCREAS Recent Labs    06/24/19 1645 06/25/19 0323 06/26/19 0545  LIPASE 344* 814* 1,043*         Studies/Results: Dg Ercp Biliary & Pancreatic Ducts  Result Date: 06/26/2019 CLINICAL DATA:  Common bile duct stone EXAM: ERCP TECHNIQUE: Multiple spot images obtained with the fluoroscopic device and submitted for  interpretation post-procedure. COMPARISON:  MRCP from the previous day FINDINGS: A series of fluoroscopic spot images document endoscopic cannulation and opacification of the CBD. Passage of balloon catheter through the CBD. Incomplete opacification of the intrahepatic biliary tree, which appears decompressed centrally. No extravasation. IMPRESSION: Endoscopic CBD cannulation and intervention. These images were submitted for radiologic interpretation only. Please see the procedural report for the amount of contrast and the fluoroscopy time utilized. Electronically Signed   By: Lucrezia Europe M.D.   On: 06/26/2019 16:16    Medications: I have reviewed the patient's current medications.  Assessment:   1.  Gallstone pancreatitis.  CBD cleared yesterday at ERCP with sphincterotomy and lack of any filling defects after balloon pull-through.  LFTs improving patient asymptomatic.   Plan: We will go ahead and stop her Rocephin and advance to low-fat diet.  If she tolerates she should be able to be discharged in the a.m.   Nancy Fetter 06/27/2019, 2:30 PM  This note was created using voice recognition software. Minor errors may Have occurred unintentionally.  Pager: [phone] If no answer or after hours call [phone]

## 2019-06-28 DIAGNOSIS — K851 Biliary acute pancreatitis without necrosis or infection: Principal | ICD-10-CM

## 2019-06-28 DIAGNOSIS — E0865 Diabetes mellitus due to underlying condition with hyperglycemia: Secondary | ICD-10-CM

## 2019-06-28 DIAGNOSIS — I10 Essential (primary) hypertension: Secondary | ICD-10-CM

## 2019-06-28 LAB — COMPREHENSIVE METABOLIC PANEL
ALT: 93 U/L — ABNORMAL HIGH (ref 0–44)
AST: 69 U/L — ABNORMAL HIGH (ref 15–41)
Albumin: 2 g/dL — ABNORMAL LOW (ref 3.5–5.0)
Alkaline Phosphatase: 321 U/L — ABNORMAL HIGH (ref 38–126)
Anion gap: 7 (ref 5–15)
BUN: 37 mg/dL — ABNORMAL HIGH (ref 8–23)
CO2: 24 mmol/L (ref 22–32)
Calcium: 8.2 mg/dL — ABNORMAL LOW (ref 8.9–10.3)
Chloride: 107 mmol/L (ref 98–111)
Creatinine, Ser: 1.11 mg/dL — ABNORMAL HIGH (ref 0.44–1.00)
GFR calc Af Amer: 51 mL/min — ABNORMAL LOW (ref >60)
GFR calc non Af Amer: 44 mL/min — ABNORMAL LOW (ref >60)
Glucose, Bld: 94 mg/dL (ref 70–99)
Potassium: 4.6 mmol/L (ref 3.5–5.1)
Sodium: 138 mmol/L (ref 135–145)
Total Bilirubin: 2.3 mg/dL — ABNORMAL HIGH (ref 0.3–1.2)
Total Protein: 4.9 g/dL — ABNORMAL LOW (ref 6.5–8.1)

## 2019-06-28 LAB — CULTURE, BLOOD (ROUTINE X 2): Special Requests: ADEQUATE

## 2019-06-28 LAB — CBC
HCT: 33.2 % — ABNORMAL LOW (ref 36.0–46.0)
Hemoglobin: 11 g/dL — ABNORMAL LOW (ref 12.0–15.0)
MCH: 31.7 pg (ref 26.0–34.0)
MCHC: 33.1 g/dL (ref 30.0–36.0)
MCV: 95.7 fL (ref 80.0–100.0)
Platelets: 285 10*3/uL (ref 150–400)
RBC: 3.47 MIL/uL — ABNORMAL LOW (ref 3.87–5.11)
RDW: 14.5 % (ref 11.5–15.5)
WBC: 14.5 10*3/uL — ABNORMAL HIGH (ref 4.0–10.5)
nRBC: 0 % (ref 0.0–0.2)

## 2019-06-28 LAB — GLUCOSE, CAPILLARY
Glucose-Capillary: 89 mg/dL (ref 70–99)
Glucose-Capillary: 261 mg/dL — ABNORMAL HIGH (ref 70–99)

## 2019-06-28 LAB — MAGNESIUM: Magnesium: 2.2 mg/dL (ref 1.7–2.4)

## 2019-06-28 LAB — LIPASE, BLOOD: Lipase: 155 U/L — ABNORMAL HIGH (ref 11–51)

## 2019-06-28 MED ORDER — METRONIDAZOLE IN NACL 5-0.79 MG/ML-% IV SOLN: 500.0000 mg | Freq: Three times a day (TID) | INTRAVENOUS | Status: DC

## 2019-06-28 MED ORDER — SULFAMETHOXAZOLE-TRIMETHOPRIM 800-160 MG PO TABS
1.0000 | ORAL_TABLET | Freq: Two times a day (BID) | ORAL | Status: DC
  Administered 2019-06-28: 1 via ORAL
  Filled 2019-06-28 (×2): qty 1

## 2019-06-28 MED ORDER — SULFAMETHOXAZOLE-TRIMETHOPRIM 800-160 MG PO TABS: 1.0000 | ORAL_TABLET | Freq: Two times a day (BID) | ORAL | 0 refills | Status: DC

## 2019-06-28 NOTE — Plan of Care (Signed)

## 2019-06-28 NOTE — Discharge Summary (Signed)
Physician Discharge Summary  Nancy Webster [id number] DOB: November 06, 1927 DOA: 06/24/2019  PCP: Lajean Manes, MD  Admit date: 06/24/2019 Discharge date: 06/28/2019  Admitted From: Home Disposition: Home  Recommendations for Outpatient Follow-up:  1. Follow up with PCP in 1-2 weeks 2. Please obtain CBC/BMP/Mag at follow up 3. Please follow up on the following pending results: None  Home Health: None Equipment/Devices: None  Discharge Condition: Stable CODE STATUS: Full code   Hospital Course: 85 year old female with history of DM-2, CAD, HTN, CKD-3, cholecystectomy in 02/2018 and anxiety presenting with acute upper abdominal pain for 1 day and admitted with acute pancreatitis.   In ED, hemodynamically stable.  Liver enzymes, total bili and lipase elevated.  CT abdomen and pelvis revealed uncomplicated pancreatitis with intrahepatic biliary duct dilation suspicious for choledocholithiasis.   Patient was admitted.  MRCP concerning for choledocholithiasis.  GI consulted.  Patient had ERCP on 8/27 that revealed ulcer at the ampulla likely from past CBD stone and moderate dilation entire bile duct.  Had biliary sphincterectomy and biliary tree sweeping.  Liver enzymes and lipase improved significantly.  Symptoms resolved.  She tolerated diet without problem.  She also had E. coli bacteremia.  Treated with IV meropenem, then transitioned to IV ceftriaxone.  Repeat blood culture on 8/28- so far.  She was discharged on Bactrim for 5 more days based on sensitivity to complete treatment course.   See individual problems below for more on hospital course.  Discharge Diagnoses:  Acute uncomplicated pancreatitis in the setting of choledocholithiasis -Patient presented with acute upper abdominal pain that has improved significantly. -CT AP-acute uncomplicated pancreatitis with intrahepatic biliary duct dilation suspicious for CBD stone -MRCP confirms choledocholithiasis.  -ERCP as  above. -Calcium within normal range.  No hypertriglyceridemia or lipid panel. -Lipase, LFT and bilirubin trended down. -Cleared for discharge by GI. -Meropenem 8/26-8/28>>Ceftriaxone 8/28-8/29>> Bactrim DS 8/29-9/3 -Recheck CMP at follow-up.  E. coli bacteremia: Blood culture grew E. coli.  Likely due to the above.  Leukocytosis resolving. -Repeat blood culture on 8/28-negative at time of discharge. -Antibiotics as above  Elevated liver enzymes/hyperbilirubinemia/elevated alkaline phosphatase: Likely due to the above.  Improving. -Recheck CMP at follow-up.  Leukocytosis:Likely due to #1 and 3.  Blood cultures negative.  Improving. -Antibiotic as above. -Recheck CBC at follow-up.  History of CAD: No anginal symptoms. -Continue metoprolol. -Hold benazepril and Imdur in the setting of soft blood pressure.  HTN: Normotensive for most part but soft blood pressure this morning. -Discharged on home medications.  Advised to hold home benazepril and Lasix for few days.  Controlled NIDDM-2 with hyperglycemia: A1c 7.1%.  On full dose metformin at home.  -Discharged on home medications. -Consider statin once LFT resolved.  CKD-3:  Stable. -Continue IV fluids -Advised to hold home benazepril and Lasix for few days.  Anxiety: Stable -Continue home medications.  Discharge Instructions  Discharge Instructions    Call MD for:  difficulty breathing, headache or visual disturbances   Complete by: As directed    Call MD for:  persistant nausea and vomiting   Complete by: As directed    Call MD for:  severe uncontrolled pain   Complete by: As directed    Call MD for:  temperature >100.4   Complete by: As directed    Diet - low sodium heart healthy   Complete by: As directed    Diet Carb Modified   Complete by: As directed    Discharge instructions   Complete by: As directed  It has been a pleasure taking care of you! You were admitted with abdominal pain which is due to  pancreatitis as a result of bile stone.  You also had a bloodstream infection likely from injuries to your bile duct by bile stone.  You were treated with IV antibiotics.  We are discharging you on oral antibiotics to complete treatment course.  We also recommend taking a break from your fluid pill (furosemide) and benazepril for 3 to 4 days.  Please review your new medication list and the directions before you take your medications. Please call your primary care office  as soon as possible to schedule hospital follow-up visit in 1 to 2 weeks.  Take care,   Increase activity slowly   Complete by: As directed      Allergies as of 06/28/2019      Reactions   Atorvastatin Other (See Comments)   UNKNOWN   Other Other (See Comments)   Cramps to all Cholesterol medications   Statins Other (See Comments)   "cramps" UNKNOWN      Medication List    TAKE these medications   acetaminophen 500 MG tablet Commonly known as: TYLENOL Take 500 mg by mouth at bedtime.   ALPRAZolam 0.5 MG tablet Commonly known as: XANAX Take 0.5 mg by mouth at bedtime as needed for anxiety.   benazepril 10 MG tablet Commonly known as: LOTENSIN Take 10 mg by mouth daily.   furosemide 40 MG tablet Commonly known as: LASIX Take 20 mg by mouth daily as needed for fluid.   gabapentin 100 MG capsule Commonly known as: NEURONTIN Take 100 mg by mouth at bedtime.   isosorbide mononitrate 30 MG 24 hr tablet Commonly known as: IMDUR Take 30 mg by mouth daily.   metFORMIN 1000 MG tablet Commonly known as: GLUCOPHAGE Take 1,000 mg by mouth 2 (two) times daily.   metoprolol succinate 25 MG 24 hr tablet Commonly known as: TOPROL-XL Take 25 mg by mouth daily.   multivitamin with minerals Tabs tablet Take 1 tablet by mouth daily.   pantoprazole 40 MG tablet Commonly known as: PROTONIX Take 40 mg by mouth daily.   sodium chloride 0.65 % Soln nasal spray Commonly known as: OCEAN Place 1 spray into both  nostrils as needed for congestion.   sulfamethoxazole-trimethoprim 800-160 MG tablet Commonly known as: BACTRIM DS Take 1 tablet by mouth every 12 (twelve) hours.      Follow-up Information    Stoneking, Hal, MD. Schedule an appointment as soon as possible for a visit in 1 week(s).   Specialty: Internal Medicine Contact information: [address] [place] [address] [phone]           Consultations:  Gastroenterology  Procedures/Studies:  2D Echo: None  ERCP on 2019-07-16 revealed ulcer at the ampulla likely from past CBD stone and moderate dilation entire bile duct.  Had biliary sphincterectomy and biliary tree sweeping  Dg Chest 2 View  Result Date: 06/24/2019 CLINICAL DATA:  Epigastric pain at began prior to going to bed last night, lasted 3 night, history coronary artery disease, hypertension, GERD, diabetes mellitus EXAM: CHEST - 2 VIEW COMPARISON:  03/01/2018 FINDINGS: Normal heart size and pulmonary vascularity. Hiatal hernia. Atherosclerotic calcifications aorta. Chronic bronchitic changes with linear scarring at RIGHT base. No acute infiltrate, pleural effusion, or pneumothorax. Bones demineralized with mild degenerative disc disease changes of the thoracic spine. IMPRESSION: Bronchitic changes with RIGHT basilar atelectasis. Electronically Signed   By: Lavonia Dana  M.D.   On: 06/24/2019 12:53   Ct Abdomen Pelvis W Contrast  Result Date: 06/24/2019 CLINICAL DATA:  Epigastric abdominal pain since last night. EXAM: CT ABDOMEN AND PELVIS WITH CONTRAST TECHNIQUE: Multidetector CT imaging of the abdomen and pelvis was performed using the standard protocol following bolus administration of intravenous contrast. CONTRAST:  8mL OMNIPAQUE IOHEXOL 300 MG/ML  SOLN COMPARISON:  Abdominal ultrasound of 03/02/2018. Abdominal CT of 03/02/2018. FINDINGS: Lower chest: Subsegmental atelectasis at the bases. Normal heart size without pericardial or pleural effusion. A  moderate hiatal hernia. Hepatobiliary: Degradation secondary to patient arm position and EKG wires and leads, affecting the upper abdomen. No focal liver lesion. Cholecystectomy. Mild intrahepatic biliary duct dilatation. The common duct measures within normal limits at 8 mm in the porta hepatis on transverse imaging. Up to 9 mm on coronal image 45/6. Possible choledocholithiasis distally, including on coronal image 47. Pancreas: Suspicion of mild pancreatic enlargement for age and mild peripancreatic edema. Example adjacent the head on image 27/3. No pancreatic necrosis or duct dilatation. Spleen: Normal in size, without focal abnormality. Adrenals/Urinary Tract: Mild left adrenal thickening. Normal right adrenal gland. Interpolar left renal 2.6 cm cyst. Normal right kidney, without hydronephrosis or hydroureter. Normal urinary bladder. Stomach/Bowel: Gastric antral underdistention. Extensive colonic diverticulosis. Normal terminal ileum. Normal small bowel. Vascular/Lymphatic: Advanced aortic and branch vessel atherosclerosis. Patent portal and splenic veins. No abdominopelvic adenopathy. Reproductive: Normal uterus and adnexa. Other: No significant free fluid. Musculoskeletal: Degenerative disc disease, primarily at L3-4 and L2-3. IMPRESSION: 1. Findings suspicious for non complicated pancreatitis. 2. Cholecystectomy. Development of intrahepatic biliary duct dilatation. Although the common duct is normal in caliber for age, suspicion of distal choledocholithiasis. Correlate with bilirubin levels. 3. Moderate hiatal hernia. 4.  Aortic Atherosclerosis (ICD10-I70.0). Electronically Signed   By: Abigail Miyamoto M.D.   On: 06/24/2019 20:13   Mr Abdomen Mrcp Wo Contrast  Result Date: 06/25/2019 CLINICAL DATA:  Epigastric pain. CT demonstrating biliary duct dilatation. Elevated lipase. EXAM: MRI ABDOMEN WITHOUT CONTRAST  (INCLUDING MRCP) TECHNIQUE: Multiplanar multisequence MR imaging of the abdomen was performed.  Heavily T2-weighted images of the biliary and pancreatic ducts were obtained, and three-dimensional MRCP images were rendered by post processing. COMPARISON:  CT of 06/24/2019 FINDINGS: Portions of exam are mildly motion degraded. Lower chest: Normal heart size.  Moderate hiatal hernia. Hepatobiliary: No focal liver lesion. Mild hepatic steatosis. Cholecystectomy. Mild intrahepatic biliary duct dilatation. The common duct measures 7 mm on 18/3. Approximately 4-5 mm signal abnormality at the level of the ampulla, including on images 19/3, 46/8, 18/10. Pancreas: Peripancreatic edema is mild, including on series 5. No pancreatic duct dilatation. Spleen:  Normal in size, without focal abnormality. Adrenals/Urinary Tract: Left adrenal thickening. Normal right adrenal gland. Normal right kidney. Left renal interpolar 2.9 cm cyst. Stomach/Bowel: Normal abdominal bowel loops. Vascular/Lymphatic: Normal abdominal aortic caliber. Mildly prominent porta hepatis nodes are likely reactive. Other:  No ascites. Musculoskeletal: Mild convex left lumbar spine curvature. IMPRESSION: 1. Intrahepatic biliary duct dilatation after cholecystectomy. Although the common duct is normal in caliber, signal abnormality of 4-5 mm at the level of the ampulla is highly suspicious for isolated choledocholithiasis. Consider ERCP. 2. Mild pancreatitis, as before. 3. Moderate hiatal hernia. 4. Mild hepatic steatosis. Electronically Signed   By: Abigail Miyamoto M.D.   On: 06/25/2019 14:04   Dg Ercp Biliary & Pancreatic Ducts  Result Date: 06/26/2019 CLINICAL DATA:  Common bile duct stone EXAM: ERCP TECHNIQUE: Multiple spot images obtained with the fluoroscopic device and submitted  for interpretation post-procedure. COMPARISON:  MRCP from the previous day FINDINGS: A series of fluoroscopic spot images document endoscopic cannulation and opacification of the CBD. Passage of balloon catheter through the CBD. Incomplete opacification of the intrahepatic  biliary tree, which appears decompressed centrally. No extravasation. IMPRESSION: Endoscopic CBD cannulation and intervention. These images were submitted for radiologic interpretation only. Please see the procedural report for the amount of contrast and the fluoroscopy time utilized. Electronically Signed   By: Lucrezia Europe M.D.   On: 06/26/2019 16:16      Subjective: No major events overnight of this morning.  Tolerating regular diet.  Denies chest pain, dyspnea, nausea, vomiting or abdominal pain.  Eager to go home.   Discharge Exam: Vitals:   06/27/19 2120 06/28/19 0508  BP: (!) 117/57 133/61  Pulse: 66 61  Resp: 18 18  Temp: 98.3 F (36.8 C) 98.2 F (36.8 C)  SpO2:  95%    GENERAL: No acute distress.  Appears well.  HEENT: MMM.  Vision and hearing grossly intact.  NECK: Supple.  No JVD.  LUNGS:  No IWOB. Good air movement bilaterally. HEART:  RRR. Heart sounds normal.  ABD: Bowel sounds present. Soft. Non tender.  MSK/EXT:  Moves all extremities. No apparent deformity. No edema bilaterally. SKIN: no apparent skin lesion or wound NEURO: Awake, alert and oriented appropriately.  No gross deficit.  PSYCH: Calm. Normal affect.     The results of significant diagnostics from this hospitalization (including imaging, microbiology, ancillary and laboratory) are listed below for reference.     Microbiology: Recent Results (from the past 240 hour(s))  SARS CORONAVIRUS 2 (TAT 6-12 HRS) Nasal Swab Aptima Multi Swab     Status: None   Collection Time: 06/24/19  6:26 PM   Specimen: Aptima Multi Swab; Nasal Swab  Result Value Ref Range Status   SARS Coronavirus 2 NEGATIVE NEGATIVE Final    Comment: (NOTE) SARS-CoV-2 target nucleic acids are NOT DETECTED. The SARS-CoV-2 RNA is generally detectable in upper and lower respiratory specimens during the acute phase of infection. Negative results do not preclude SARS-CoV-2 infection, do not rule out co-infections with other pathogens,  and should not be used as the sole basis for treatment or other patient management decisions. Negative results must be combined with clinical observations, patient history, and epidemiological information. The expected result is Negative. Fact Sheet for Patients: SugarRoll.be Fact Sheet for Healthcare Providers: https://www.woods-mathews.com/ This test is not yet approved or cleared by the [place] FDA and  has been authorized for detection and/or diagnosis of SARS-CoV-2 by FDA under an Emergency Use Authorization (EUA). This EUA will remain  in effect (meaning this test can be used) for the duration of the COVID-19 declaration under Section 56 4(b)(1) of the Act, 21 U.S.C. section 360bbb-3(b)(1), unless the authorization is terminated or revoked sooner. Performed at [facility], [place] [address], [place], [place] [address]   Culture, blood (routine x 2)     Status: Abnormal   Collection Time: 06/24/19 10:06 PM   Specimen: BLOOD  Result Value Ref Range Status   Specimen Description BLOOD RIGHT ANTECUBITAL  Final   Special Requests   Final    BOTTLES DRAWN AEROBIC AND ANAEROBIC Blood Culture adequate volume   Culture  Setup Time   Final    GRAM NEGATIVE RODS IN BOTH AEROBIC AND ANAEROBIC BOTTLES CRITICAL RESULT CALLED TO, READ BACK BY AND VERIFIED WITH: J [facility] PHARMD 06/27/19 0100 JDW Performed at [facility],  [address], [place], [place] [address]    Culture ESCHERICHIA COLI (A)  Final   Report Status 06/28/2019 FINAL  Final   Organism ID, Bacteria ESCHERICHIA COLI  Final      Susceptibility   Escherichia coli - MIC*    AMPICILLIN >=32 RESISTANT Resistant     CEFAZOLIN 8 SENSITIVE Sensitive     CEFEPIME <=1 SENSITIVE Sensitive     CEFTAZIDIME <=1 SENSITIVE Sensitive     CEFTRIAXONE <=1 SENSITIVE Sensitive     CIPROFLOXACIN <=0.25 SENSITIVE Sensitive     GENTAMICIN <=1 SENSITIVE Sensitive     IMIPENEM <=0.25  SENSITIVE Sensitive     TRIMETH/SULFA <=20 SENSITIVE Sensitive     AMPICILLIN/SULBACTAM >=32 RESISTANT Resistant     PIP/TAZO <=4 SENSITIVE Sensitive     Extended ESBL NEGATIVE Sensitive     * ESCHERICHIA COLI  Blood Culture ID Panel (Reflexed)     Status: Abnormal   Collection Time: 06/24/19 10:06 PM  Result Value Ref Range Status   Enterococcus species NOT DETECTED NOT DETECTED Final   Listeria monocytogenes NOT DETECTED NOT DETECTED Final   Staphylococcus species NOT DETECTED NOT DETECTED Final   Staphylococcus aureus (BCID) NOT DETECTED NOT DETECTED Final   Streptococcus species NOT DETECTED NOT DETECTED Final   Streptococcus agalactiae NOT DETECTED NOT DETECTED Final   Streptococcus pneumoniae NOT DETECTED NOT DETECTED Final   Streptococcus pyogenes NOT DETECTED NOT DETECTED Final   Acinetobacter baumannii NOT DETECTED NOT DETECTED Final   Enterobacteriaceae species DETECTED (A) NOT DETECTED Final    Comment: Enterobacteriaceae represent a large family of gram-negative bacteria, not a single organism. CRITICAL RESULT CALLED TO, READ BACK BY AND VERIFIED WITH: J LEDFORD PHARMD 06/27/19 0100 JDW    Enterobacter cloacae complex NOT DETECTED NOT DETECTED Final   Escherichia coli DETECTED (A) NOT DETECTED Final    Comment: CRITICAL RESULT CALLED TO, READ BACK BY AND VERIFIED WITH: J LEDFORD PHARMD 06/27/19 0100 JDW    Klebsiella oxytoca NOT DETECTED NOT DETECTED Final   Klebsiella pneumoniae NOT DETECTED NOT DETECTED Final   Proteus species NOT DETECTED NOT DETECTED Final   Serratia marcescens NOT DETECTED NOT DETECTED Final   Carbapenem resistance NOT DETECTED NOT DETECTED Final   Haemophilus influenzae NOT DETECTED NOT DETECTED Final   Neisseria meningitidis NOT DETECTED NOT DETECTED Final   Pseudomonas aeruginosa NOT DETECTED NOT DETECTED Final   Candida albicans NOT DETECTED NOT DETECTED Final   Candida glabrata NOT DETECTED NOT DETECTED Final   Candida krusei NOT DETECTED NOT  DETECTED Final   Candida parapsilosis NOT DETECTED NOT DETECTED Final   Candida tropicalis NOT DETECTED NOT DETECTED Final    Comment: Performed at [facility], [place]. [address] [place], [place] [address]  Culture, blood (routine x 2)     Status: None (Preliminary result)   Collection Time: 06/24/19 10:14 PM   Specimen: BLOOD RIGHT FOREARM  Result Value Ref Range Status   Specimen Description BLOOD RIGHT FOREARM  Final   Special Requests   Final    BOTTLES DRAWN AEROBIC ONLY Blood Culture adequate volume   Culture  Setup Time   Final    GRAM NEGATIVE RODS AEROBIC BOTTLE ONLY CRITICAL VALUE NOTED.  VALUE IS CONSISTENT WITH PREVIOUSLY REPORTED AND CALLED VALUE. Performed at [facility], [place] [address] [place], [place] [address]    Culture GRAM NEGATIVE RODS  Final   Report Status PENDING  Incomplete     Labs: BNP (last 3  results) No results for input(s): BNP in the last 8760 hours. Basic Metabolic Panel: Recent Labs  Lab 06/24/19 1207 06/24/19 2207 06/25/19 0323 06/26/19 0545 06/27/19 0421 06/28/19 0448  NA 134*  --  135 139 138 138  K 4.2  --  4.1 4.2 5.0 4.6  CL 96*  --  98 104 104 107  CO2 24  --  25 25 25 24   GLUCOSE 367*  --  271* 232* 365* 94  BUN 21  --  23 28* 40* 37*  CREATININE 1.21* 1.20* 1.31* 1.09* 1.47* 1.11*  CALCIUM 9.5  --  8.8* 8.9 8.6* 8.2*  MG  --  1.5*  --  2.1 2.2 2.2  PHOS  --  2.2*  --  3.5  --   --    Liver Function Tests: Recent Labs  Lab 06/24/19 1645 06/25/19 0323 06/26/19 0545 06/27/19 0421 06/28/19 0448  AST 290* 162* 106* 69* 69*  ALT 301* 213* 146* 121* 93*  ALKPHOS 510* 379* 384* 390* 321*  BILITOT 6.4* 5.9* 5.7* 3.7* 2.3*  PROT 7.4 5.9* 5.6* 5.4* 4.9*  ALBUMIN 3.6 2.8* 2.5* 2.3* 2.0*   Recent Labs  Lab 06/24/19 1645 06/25/19 0323 06/26/19 0545 06/27/19 1545 06/28/19 0448  LIPASE 344* 814* 1,043* 286* 155*   No results for input(s): AMMONIA in the last 168 hours. CBC: Recent Labs  Lab 06/24/19 1207  06/24/19 2207 06/26/19 0545 06/27/19 0421 06/28/19 0448  WBC 25.5* 28.5* 15.7* 11.7* 14.5*  HGB 12.9 12.9 11.0* 11.6* 11.0*  HCT 40.3 37.5 33.2* 34.9* 33.2*  MCV 98.8 94.0 95.1 96.7 95.7  PLT 357 289 258 269 285   Cardiac Enzymes: No results for input(s): CKTOTAL, CKMB, CKMBINDEX, TROPONINI in the last 168 hours. BNP: Invalid input(s): POCBNP CBG: Recent Labs  Lab 06/27/19 0608 06/27/19 1204 06/27/19 1644 06/27/19 2119 06/28/19 0609  GLUCAP 343* 360* 203* 176* 89   D-Dimer No results for input(s): DDIMER in the last 72 hours. Hgb A1c No results for input(s): HGBA1C in the last 72 hours. Lipid Profile No results for input(s): CHOL, HDL, LDLCALC, TRIG, CHOLHDL, LDLDIRECT in the last 72 hours. Thyroid function studies No results for input(s): TSH, T4TOTAL, T3FREE, THYROIDAB in the last 72 hours.  Invalid input(s): FREET3 Anemia work up No results for input(s): VITAMINB12, FOLATE, FERRITIN, TIBC, IRON, RETICCTPCT in the last 72 hours. Urinalysis No results found for: COLORURINE, APPEARANCEUR, LABSPEC, PHURINE, GLUCOSEU, HGBUR, BILIRUBINUR, KETONESUR, PROTEINUR, UROBILINOGEN, NITRITE, LEUKOCYTESUR Sepsis Labs Invalid input(s): PROCALCITONIN,  WBC,  LACTICIDVEN   Time coordinating discharge: 35 minutes  SIGNED:  Mercy Riding, MD  [facility] 06/28/2019, 10:51 AM  If 7PM-7AM, please contact night-coverage [email or web address] Password [email or web address]

## 2019-06-28 NOTE — Progress Notes (Signed)
[facility] Gastroenterology Progress Note  Nancy Webster 85 y.o. 08-02-1928  CC: Pancreatitis   Subjective: Abdominal pain has improved but complaining of abdominal bloating now.  Last bowel movement yesterday.  Denies any nausea or vomiting.  Denies any blood in the stool or black stool.  ROS : Negative for acute chest pain and shortness of breath   Objective: Vital signs in last 24 hours: Vitals:   06/27/19 2120 06/28/19 0508  BP: (!) 117/57 133/61  Pulse: 66 61  Resp: 18 18  Temp: 98.3 F (36.8 C) 98.2 F (36.8 C)  SpO2:  95%    Physical Exam:  General:  Alert, cooperative, no distress, elderly appearing  Head:  Normocephalic, without obvious abnormality, atraumatic  Eyes:  , EOM's intact,   Lungs:   Clear to auscultation bilaterally, respirations unlabored  Heart:  Regular rate and rhythm, S1, S2 normal  Abdomen:    Abdomen mild distended but soft, nontender, bowel sounds present.  No peritoneal signs  Extremities: Extremities normal, atraumatic,trace  edema       Lab Results: Recent Labs    06/26/19 0545 06/27/19 0421 06/28/19 0448  NA 139 138 138  K 4.2 5.0 4.6  CL 104 104 107  CO2 25 25 24   GLUCOSE 232* 365* 94  BUN 28* 40* 37*  CREATININE 1.09* 1.47* 1.11*  CALCIUM 8.9 8.6* 8.2*  MG 2.1 2.2 2.2  PHOS 3.5  --   --    Recent Labs    06/27/19 0421 06/28/19 0448  AST 69* 69*  ALT 121* 93*  ALKPHOS 390* 321*  BILITOT 3.7* 2.3*  PROT 5.4* 4.9*  ALBUMIN 2.3* 2.0*   Recent Labs    06/27/19 0421 06/28/19 0448  WBC 11.7* 14.5*  HGB 11.6* 11.0*  HCT 34.9* 33.2*  MCV 96.7 95.7  PLT 269 285   No results for input(s): LABPROT, INR in the last 72 hours.    Assessment/Plan: -Gallstone pancreatitis.  ERCP 06/26/2019 showed ulcer at ampulla probably from passed CBD stone.  No filling defects were identified.  Sphincterotomy was performed.  She is status post cholecystectomy in May 2019. -E. coli  bacteremia. -Leukocytosis  Recommendations -------------------------- -LFTs improving.  Abdominal pain resolved.  Complaining of abdominal bloating. -Restarted on Rocephin for bacteremia.  Management of antibiotics per primary team. -No further inpatient GI work-up planned.  GI will sign off.  Call us back if needed  Otis Brace MD, [place] 06/28/2019, 9:02 AM  Contact #  [phone]

## 2019-06-30 LAB — CULTURE, BLOOD (ROUTINE X 2): Special Requests: ADEQUATE

## 2019-07-02 LAB — CULTURE, BLOOD (ROUTINE X 2)
Culture: NO GROWTH
Culture: NO GROWTH
Special Requests: ADEQUATE
Special Requests: ADEQUATE

## 2019-07-10 DIAGNOSIS — R109 Unspecified abdominal pain: Secondary | ICD-10-CM | POA: Diagnosis not present

## 2019-07-10 DIAGNOSIS — Z79899 Other long term (current) drug therapy: Secondary | ICD-10-CM | POA: Diagnosis not present

## 2019-07-10 DIAGNOSIS — E1121 Type 2 diabetes mellitus with diabetic nephropathy: Secondary | ICD-10-CM | POA: Diagnosis not present

## 2019-08-14 DIAGNOSIS — E7800 Pure hypercholesterolemia, unspecified: Secondary | ICD-10-CM | POA: Diagnosis not present

## 2019-08-14 DIAGNOSIS — I129 Hypertensive chronic kidney disease with stage 1 through stage 4 chronic kidney disease, or unspecified chronic kidney disease: Secondary | ICD-10-CM | POA: Diagnosis not present

## 2019-08-14 DIAGNOSIS — E1121 Type 2 diabetes mellitus with diabetic nephropathy: Secondary | ICD-10-CM | POA: Diagnosis not present

## 2019-08-14 DIAGNOSIS — L659 Nonscarring hair loss, unspecified: Secondary | ICD-10-CM | POA: Diagnosis not present

## 2019-08-14 DIAGNOSIS — N1831 Chronic kidney disease, stage 3a: Secondary | ICD-10-CM | POA: Diagnosis not present

## 2019-08-14 DIAGNOSIS — E11319 Type 2 diabetes mellitus with unspecified diabetic retinopathy without macular edema: Secondary | ICD-10-CM | POA: Diagnosis not present

## 2019-08-14 DIAGNOSIS — Z79899 Other long term (current) drug therapy: Secondary | ICD-10-CM | POA: Diagnosis not present

## 2019-09-22 DIAGNOSIS — R252 Cramp and spasm: Secondary | ICD-10-CM | POA: Diagnosis not present

## 2019-09-22 DIAGNOSIS — I10 Essential (primary) hypertension: Secondary | ICD-10-CM | POA: Diagnosis not present

## 2019-10-01 DIAGNOSIS — Z961 Presence of intraocular lens: Secondary | ICD-10-CM | POA: Diagnosis not present

## 2019-10-01 DIAGNOSIS — H52203 Unspecified astigmatism, bilateral: Secondary | ICD-10-CM | POA: Diagnosis not present

## 2019-10-01 DIAGNOSIS — H35373 Puckering of macula, bilateral: Secondary | ICD-10-CM | POA: Diagnosis not present

## 2019-10-04 NOTE — Progress Notes (Signed)
Cardiology Office Note   Date:  10/06/2019   ID:  Nancy Webster, DOB Nov 13, 1927, MRN [id number]  PCP:  Lajean Manes, MD  Cardiologist:   Dorris Carnes, MD   Patient presents for follow-up of SVT.    History of Present Illness: Nancy Webster is a 85 y.o. female with a history of SVT, DM (she says dx around age 60 yo), HTN, HL, GERD, CKD.   She aw previously followed in [facility] to [place]  She is followed by [employer] I saw the pt in fall 2019    Since seen the patient denies any significant palpitations.  She has rare isolated palpitations.  Her breathing is okay.  She denies chest discomfort.      Current Meds  Medication Sig  . acetaminophen (TYLENOL) 500 MG tablet Take 500 mg by mouth at bedtime.  . ALPRAZolam (XANAX) 0.5 MG tablet Take 0.5 mg by mouth at bedtime as needed for anxiety.  . B Complex Vitamins (VITAMIN B COMPLEX PO) Take 1 tablet by mouth as directed.  . benazepril (LOTENSIN) 10 MG tablet Take 20 mg by mouth daily.   Marland Kitchen BIOTIN 5000 PO Take 5,000 mcg by mouth daily.  . furosemide (LASIX) 40 MG tablet Take 20 mg by mouth daily as needed for fluid.   Marland Kitchen gabapentin (NEURONTIN) 100 MG capsule Take 100 mg by mouth at bedtime.  . isosorbide mononitrate (IMDUR) 30 MG 24 hr tablet Take 30 mg by mouth daily.  . metFORMIN (GLUCOPHAGE) 1000 MG tablet Take 1,000 mg by mouth 2 (two) times daily.   . metoprolol succinate (TOPROL-XL) 25 MG 24 hr tablet Take 25 mg by mouth daily.  . Multiple Vitamin (MULTIVITAMIN WITH MINERALS) TABS tablet Take 1 tablet by mouth daily.  . pantoprazole (PROTONIX) 40 MG tablet Take 40 mg by mouth daily.  . sodium chloride (OCEAN) 0.65 % SOLN nasal spray Place 1 spray into both nostrils as needed for congestion.     Allergies:   Atorvastatin, Other, and Statins   Past Medical History:  Diagnosis Date  . Anxiety   . Coronary artery disease   . Diabetes mellitus without complication ([place])   . GERD (gastroesophageal reflux disease)   .  Hypertension   . Renal disorder    stage 3    Past Surgical History:  Procedure Laterality Date  . CHOLECYSTECTOMY N/A 03/02/2018   Procedure: LAPAROSCOPIC CHOLECYSTECTOMY;  Surgeon: Greer Pickerel, MD;  Location: [facility];  Service: General;  Laterality: N/A;  . ERCP N/A 06/26/2019   Procedure: ENDOSCOPIC RETROGRADE CHOLANGIOPANCREATOGRAPHY (ERCP);  Surgeon: [name], MD;  Location: [facility];  Service: Gastroenterology;  Laterality: N/A;  . SPHINCTEROTOMY  06/26/2019   Procedure: SPHINCTEROTOMY;  Surgeon: [name], MD;  Location[facility] ENDOSCOPY;  Service: Gastroenterology;;     Social History:  The patient  reports that she has never smoked. She has never used smokeless tobacco. She reports previous alcohol use. She reports that she does not use drugs.   Family History:  The patient's family history includes Heart failure in her mother.    ROS:  Please see the history of present illness. All other systems are reviewed and  Negative to the above problem except as noted.    PHYSICAL EXAM: VS:  BP (!) 110/58   Pulse 79   Ht 5' 3.5" (1.613 m)   Wt 147 lb (66.7 kg)   SpO2 97%   BMI 25.63 kg/m   GEN: Well nourished, well  developed, in no acute distress  HEENT: normal  Neck: no JVD, carotid bruits, Cardiac: RRR; no murmurs, rubs, or gallops,no edema  Respiratory:  clear to auscultation bilaterally, normal work of breathing GI: soft, nontender, nondistended, + BS  No hepatomegaly  MS: no deformity Moving all extremities   Skin: warm and dry, no rash Neuro:  Strength and sensation are intact Psych: euthymic mood, full affect   EKG:  EKG is not ordered today.   Lipid Panel    Component Value Date/Time   CHOL 193 06/24/2019 2207   TRIG 130 06/24/2019 2207   HDL 48 06/24/2019 2207   CHOLHDL 4.0 06/24/2019 2207   VLDL 26 06/24/2019 2207   LDLCALC 119 (H) 06/24/2019 2207      Wt Readings from Last 3 Encounters:  10/06/19 147 lb (66.7 kg)  06/28/19 159 lb 12.8 oz (72.5  kg)  08/13/18 153 lb 12.8 oz (69.8 kg)      ASSESSMENT AND PLAN:  1  Hx of SVT Denies palpitations.  Follow.  2  HTN   Well controlled keep on same meds.  3  DM watch diet.  4  HL  Did not tolerate statin   (Increased CK) patient not interested in taking.  Encourage the patient to stay active as she can.  She lives at [place].  Take care to avoid large crowds, people who may be sick.  We will set to see the patient back in 1 years time.  Sooner for problems.      Current medicines are reviewed at length with the patient today.  The patient does not have concerns regarding medicines.  Signed, Dorris Carnes, MD  10/06/2019 11:49 AM    [facility] [address] [place], [place], [place]  [address] Phone: [phone]; Fax: [phone]

## 2019-10-06 ENCOUNTER — Ambulatory Visit (INDEPENDENT_AMBULATORY_CARE_PROVIDER_SITE_OTHER): Payer: Medicare Other | Admitting: Internal Medicine

## 2019-10-06 ENCOUNTER — Other Ambulatory Visit: Payer: Self-pay

## 2019-10-06 ENCOUNTER — Encounter: Payer: Self-pay | Admitting: Internal Medicine

## 2019-10-06 VITALS — BP 110/58 | HR 79 | Ht 63.5 in | Wt 147.0 lb

## 2019-10-06 DIAGNOSIS — I10 Essential (primary) hypertension: Secondary | ICD-10-CM

## 2019-10-06 DIAGNOSIS — I471 Supraventricular tachycardia: Secondary | ICD-10-CM | POA: Diagnosis not present

## 2019-10-06 NOTE — Patient Instructions (Signed)
Medication Instructions:  Your provider recommends that you continue on your current medications as directed. Please refer to the Current Medication list given to you today.   *If you need a refill on your cardiac medications before your next appointment, please call your pharmacy*  Lab Work: None If you have labs (blood work) drawn today and your tests are completely normal, you will receive your results only by: Marland Kitchen MyChart Message (if you have MyChart) OR . A paper copy in the mail If you have any lab test that is abnormal or we need to change your treatment, we will call you to review the results.  Testing/Procedures: None  Follow-Up: At [facility], you and your health needs are our priority.  As part of our continuing mission to provide you with exceptional heart care, we have created designated Provider Care Teams.  These Care Teams include your primary Cardiologist (physician) and Advanced Practice Providers (APPs -  Physician Assistants and Nurse Practitioners) who all work together to provide you with the care you need, when you need it. Your next appointment:   12 month(s) The format for your next appointment:   In Person Provider:   You may see Dorris Carnes, MD or one of the following Advanced Practice Providers on your designated Care Team:    Richardson Dopp, PA-C  Vin Hayward, [place]  Daune Perch, [place]

## 2020-04-13 DIAGNOSIS — E103291 Type 1 diabetes mellitus with mild nonproliferative diabetic retinopathy without macular edema, right eye: Secondary | ICD-10-CM | POA: Diagnosis not present

## 2020-04-13 DIAGNOSIS — H35373 Puckering of macula, bilateral: Secondary | ICD-10-CM | POA: Diagnosis not present

## 2020-04-13 DIAGNOSIS — E103212 Type 1 diabetes mellitus with mild nonproliferative diabetic retinopathy with macular edema, left eye: Secondary | ICD-10-CM | POA: Diagnosis not present

## 2020-04-13 DIAGNOSIS — H52203 Unspecified astigmatism, bilateral: Secondary | ICD-10-CM | POA: Diagnosis not present

## 2020-04-15 DIAGNOSIS — K5792 Diverticulitis of intestine, part unspecified, without perforation or abscess without bleeding: Secondary | ICD-10-CM | POA: Diagnosis not present

## 2020-04-15 DIAGNOSIS — Z0000 Encounter for general adult medical examination without abnormal findings: Secondary | ICD-10-CM | POA: Diagnosis not present

## 2020-04-15 DIAGNOSIS — Z79899 Other long term (current) drug therapy: Secondary | ICD-10-CM | POA: Diagnosis not present

## 2020-04-15 DIAGNOSIS — Z1389 Encounter for screening for other disorder: Secondary | ICD-10-CM | POA: Diagnosis not present

## 2020-04-15 DIAGNOSIS — E11319 Type 2 diabetes mellitus with unspecified diabetic retinopathy without macular edema: Secondary | ICD-10-CM | POA: Diagnosis not present

## 2020-04-15 DIAGNOSIS — I10 Essential (primary) hypertension: Secondary | ICD-10-CM | POA: Diagnosis not present

## 2020-04-15 DIAGNOSIS — I700 Atherosclerosis of aorta: Secondary | ICD-10-CM | POA: Diagnosis not present

## 2020-04-15 DIAGNOSIS — R1032 Left lower quadrant pain: Secondary | ICD-10-CM | POA: Diagnosis not present

## 2020-04-15 DIAGNOSIS — Z23 Encounter for immunization: Secondary | ICD-10-CM | POA: Diagnosis not present

## 2020-08-06 DIAGNOSIS — Z23 Encounter for immunization: Secondary | ICD-10-CM | POA: Diagnosis not present

## 2020-10-07 NOTE — Progress Notes (Addendum)
Cardiology Office Note   Date:  10/08/2020   ID:  Nancy Webster, DOB 1927-11-11, MRN [id number]  PCP:  Lajean Manes, MD  Cardiologist:   Dorris Carnes, MD   Patient presents for follow-up of SVT.    History of Present Illness: Nancy Webster is a 86 y.o. female with a history of SVT, DM (she says dx around age 23 yo), HTN, HL, GERD, CKD.   She aw previously followed in [facility] to [place]  She is followed by [employer] I saw the pt back in Dec 2020  The patient says she has occasional palpitations while in bed  Can occur am or pm    Limited  No associated dizziness  Episodes do not occur at other times  No SOB  Or CP   Doing exercise as she can  Trigminal neuralgia  Had gamma knife  Still with R eye "wrnkles"  Tiny cysts   Current Meds  Medication Sig  . acetaminophen (TYLENOL) 500 MG tablet Take 500 mg by mouth every 8 (eight) hours as needed for mild pain or headache.  . ALPRAZolam (XANAX) 0.5 MG tablet Take 1-2 tablets by mouth as needed for anxiety  . benazepril (LOTENSIN) 10 MG tablet Take 20 mg by mouth daily.   Marland Kitchen BIOTIN 5000 PO Take 5,000 mcg by mouth daily.  . Cholecalciferol (VITAMIN D3) 1.25 MG (50000 UT) TABS Take 1 tablet by mouth daily.  . furosemide (LASIX) 40 MG tablet Take 20 mg by mouth daily.  Marland Kitchen gabapentin (NEURONTIN) 100 MG capsule Take 100 mg by mouth at bedtime.  Marland Kitchen ibuprofen (ADVIL) 200 MG tablet Take 200 mg by mouth as needed.  . isosorbide mononitrate (IMDUR) 30 MG 24 hr tablet Take 30 mg by mouth daily.  Marland Kitchen MEGARED OMEGA-3 KRILL OIL PO Take 1 tablet by mouth daily.  . metFORMIN (GLUCOPHAGE) 1000 MG tablet Take 500 mg by mouth 2 (two) times daily.  . metoprolol succinate (TOPROL-XL) 25 MG 24 hr tablet Take 25 mg by mouth daily.  . Multiple Vitamin (MULTIVITAMIN WITH MINERALS) TABS tablet Take 1 tablet by mouth daily.  . pantoprazole (PROTONIX) 40 MG tablet Take 40 mg by mouth daily.  . sodium chloride (OCEAN) 0.65 % SOLN nasal spray Place 1 spray into both  nostrils as needed for congestion.     Allergies:   Atorvastatin, Other, and Statins   Past Medical History:  Diagnosis Date  . Anxiety   . Coronary artery disease   . Diabetes mellitus without complication ([place])   . GERD (gastroesophageal reflux disease)   . Hypertension   . Renal disorder    stage 3    Past Surgical History:  Procedure Laterality Date  . CHOLECYSTECTOMY N/A 03/02/2018   Procedure: LAPAROSCOPIC CHOLECYSTECTOMY;  Surgeon: Greer Pickerel, MD;  Location: [facility];  Service: General;  Laterality: N/A;  . ERCP N/A 06/26/2019   Procedure: ENDOSCOPIC RETROGRADE CHOLANGIOPANCREATOGRAPHY (ERCP);  Surgeon: [name], MD;  Location: [facility];  Service: Gastroenterology;  Laterality: N/A;  . SPHINCTEROTOMY  06/26/2019   Procedure: SPHINCTEROTOMY;  Surgeon: [name], MD;  Location: [facility] ENDOSCOPY;  Service: Gastroenterology;;     Social History:  The patient  reports that she has never smoked. She has never used smokeless tobacco. She reports previous alcohol use. She reports that she does not use drugs.   Family History:  The patient's family history includes Heart failure in her mother.    ROS:  Please see the history  of present illness. All other systems are reviewed and  Negative to the above problem except as noted.    PHYSICAL EXAM: VS:  BP (!) 118/54   Pulse 65   Ht 5' 3.5" (1.613 m)   Wt 150 lb (68 kg)   SpO2 97%   BMI 26.15 kg/m   GEN: Well nourished, well developed, in no acute distress  HEENT: normal  Neck: no JVD, carotid bruits, Cardiac: RRR; no murmurs, ,no edema  Respiratory:  clear to auscultation bilaterally GI: soft, nontender, nondistended, + BS  No hepatomegaly  MS: no deformity Moving all extremities   Skin: warm and dry, no rash Neuro:  Strength and sensation are intact Psych: euthymic mood, full affect   EKG:  EKG is  ordered today.  SR 65  PACs     Lipid Panel    Component Value Date/Time   CHOL 193 06/24/2019 2207   TRIG 130  06/24/2019 2207   HDL 48 06/24/2019 2207   CHOLHDL 4.0 06/24/2019 2207   VLDL 26 06/24/2019 2207   LDLCALC 119 (H) 06/24/2019 2207      Wt Readings from Last 3 Encounters:  10/08/20 150 lb (68 kg)  10/06/19 147 lb (66.7 kg)  06/28/19 159 lb 12.8 oz (72.5 kg)      ASSESSMENT AND PLAN:  1  Hx of SVT No recurrence   The skips she is noting are infrequent, limited   Not SVT  2  HTN   Well controlled keep on same meds.  3  DM   Watch diet   ON metformin    4  HL  Not tolerant to statins    Encourage the patient to stay active as she can.  She lives at [place].  Take care to avoid large crowds, people who may be sick.  F/U in Nov 2022  Current medicines are reviewed at length with the patient today.  The patient does not have concerns regarding medicines.  Signed, Dorris Carnes, MD  10/08/2020 2:57 PM    [facility] [place], [place], [place]  [address] Phone: [phone]; Fax: [phone]

## 2020-10-08 ENCOUNTER — Encounter: Payer: Self-pay | Admitting: Internal Medicine

## 2020-10-08 ENCOUNTER — Ambulatory Visit (INDEPENDENT_AMBULATORY_CARE_PROVIDER_SITE_OTHER): Payer: Medicare Other | Admitting: Internal Medicine

## 2020-10-08 ENCOUNTER — Other Ambulatory Visit: Payer: Self-pay

## 2020-10-08 VITALS — BP 118/54 | HR 65 | Ht 63.5 in | Wt 150.0 lb

## 2020-10-08 DIAGNOSIS — I471 Supraventricular tachycardia: Secondary | ICD-10-CM

## 2020-10-08 MED ORDER — BIOTIN 5000 MCG PO TABS: 1.0000 | ORAL_TABLET | Freq: Every day | ORAL | 3 refills | Status: AC

## 2020-10-08 MED ORDER — CENTRUM SILVER ADULT 50+ PO TABS: 1.0000 | ORAL_TABLET | Freq: Every day | ORAL | 3 refills | Status: AC

## 2020-10-08 NOTE — Patient Instructions (Signed)
Medication Instructions:  No changes *If you need a refill on your cardiac medications before your next appointment, please call your pharmacy*   Lab Work: none If you have labs (blood work) drawn today and your tests are completely normal, you will receive your results only by: Marland Kitchen MyChart Message (if you have MyChart) OR . A paper copy in the mail If you have any lab test that is abnormal or we need to change your treatment, we will call you to review the results.   Testing/Procedures: none   Follow-Up: At [facility], you and your health needs are our priority.  As part of our continuing mission to provide you with exceptional heart care, we have created designated Provider Care Teams.  These Care Teams include your primary Cardiologist (physician) and Advanced Practice Providers (APPs -  Physician Assistants and Nurse Practitioners) who all work together to provide you with the care you need, when you need it.  We recommend signing up for the patient portal called "MyChart".  Sign up information is provided on this After Visit Summary.  MyChart is used to connect with patients for Virtual Visits (Telemedicine).  Patients are able to view lab/test results, encounter notes, upcoming appointments, etc.  Non-urgent messages can be sent to your provider as well.   To learn more about what you can do with MyChart, go to [email or web address].    Your next appointment:   11 month(s)  The format for your next appointment:   In Person  Provider:   You may see Dorris Carnes, MD or one of the following Advanced Practice Providers on your designated Care Team:    Richardson Dopp, PA-C  Robbie Lis, [place]    Other Instructions

## 2020-10-13 DIAGNOSIS — E113293 Type 2 diabetes mellitus with mild nonproliferative diabetic retinopathy without macular edema, bilateral: Secondary | ICD-10-CM | POA: Diagnosis not present

## 2020-10-13 DIAGNOSIS — H35373 Puckering of macula, bilateral: Secondary | ICD-10-CM | POA: Diagnosis not present

## 2020-10-13 DIAGNOSIS — H52203 Unspecified astigmatism, bilateral: Secondary | ICD-10-CM | POA: Diagnosis not present

## 2020-10-13 DIAGNOSIS — H26493 Other secondary cataract, bilateral: Secondary | ICD-10-CM | POA: Diagnosis not present

## 2020-10-14 DIAGNOSIS — Z23 Encounter for immunization: Secondary | ICD-10-CM | POA: Diagnosis not present

## 2020-10-14 DIAGNOSIS — G720 Drug-induced myopathy: Secondary | ICD-10-CM | POA: Diagnosis not present

## 2020-10-14 DIAGNOSIS — N1831 Chronic kidney disease, stage 3a: Secondary | ICD-10-CM | POA: Diagnosis not present

## 2020-10-14 DIAGNOSIS — Z7984 Long term (current) use of oral hypoglycemic drugs: Secondary | ICD-10-CM | POA: Diagnosis not present

## 2020-10-14 DIAGNOSIS — I129 Hypertensive chronic kidney disease with stage 1 through stage 4 chronic kidney disease, or unspecified chronic kidney disease: Secondary | ICD-10-CM | POA: Diagnosis not present

## 2020-10-14 DIAGNOSIS — E1121 Type 2 diabetes mellitus with diabetic nephropathy: Secondary | ICD-10-CM | POA: Diagnosis not present

## 2020-10-14 DIAGNOSIS — E11319 Type 2 diabetes mellitus with unspecified diabetic retinopathy without macular edema: Secondary | ICD-10-CM | POA: Diagnosis not present

## 2020-11-05 ENCOUNTER — Ambulatory Visit (INDEPENDENT_AMBULATORY_CARE_PROVIDER_SITE_OTHER): Payer: Medicare Other | Admitting: Podiatry

## 2020-11-05 ENCOUNTER — Other Ambulatory Visit: Payer: Self-pay

## 2020-11-05 DIAGNOSIS — M5416 Radiculopathy, lumbar region: Secondary | ICD-10-CM | POA: Insufficient documentation

## 2020-11-05 DIAGNOSIS — E1151 Type 2 diabetes mellitus with diabetic peripheral angiopathy without gangrene: Secondary | ICD-10-CM | POA: Diagnosis not present

## 2020-11-05 DIAGNOSIS — R0989 Other specified symptoms and signs involving the circulatory and respiratory systems: Secondary | ICD-10-CM | POA: Insufficient documentation

## 2020-11-05 DIAGNOSIS — E7800 Pure hypercholesterolemia, unspecified: Secondary | ICD-10-CM | POA: Insufficient documentation

## 2020-11-05 DIAGNOSIS — B351 Tinea unguium: Secondary | ICD-10-CM

## 2020-11-05 DIAGNOSIS — E1121 Type 2 diabetes mellitus with diabetic nephropathy: Secondary | ICD-10-CM | POA: Insufficient documentation

## 2020-11-05 DIAGNOSIS — I509 Heart failure, unspecified: Secondary | ICD-10-CM | POA: Insufficient documentation

## 2020-11-05 DIAGNOSIS — E559 Vitamin D deficiency, unspecified: Secondary | ICD-10-CM | POA: Insufficient documentation

## 2020-11-05 DIAGNOSIS — I700 Atherosclerosis of aorta: Secondary | ICD-10-CM | POA: Insufficient documentation

## 2020-11-05 DIAGNOSIS — I129 Hypertensive chronic kidney disease with stage 1 through stage 4 chronic kidney disease, or unspecified chronic kidney disease: Secondary | ICD-10-CM | POA: Insufficient documentation

## 2020-11-05 DIAGNOSIS — E1140 Type 2 diabetes mellitus with diabetic neuropathy, unspecified: Secondary | ICD-10-CM | POA: Insufficient documentation

## 2020-11-05 DIAGNOSIS — E785 Hyperlipidemia, unspecified: Secondary | ICD-10-CM | POA: Insufficient documentation

## 2020-11-05 DIAGNOSIS — E11319 Type 2 diabetes mellitus with unspecified diabetic retinopathy without macular edema: Secondary | ICD-10-CM | POA: Insufficient documentation

## 2020-11-05 DIAGNOSIS — H269 Unspecified cataract: Secondary | ICD-10-CM | POA: Insufficient documentation

## 2020-11-05 DIAGNOSIS — L84 Corns and callosities: Secondary | ICD-10-CM | POA: Diagnosis not present

## 2020-11-05 DIAGNOSIS — N1831 Chronic kidney disease, stage 3a: Secondary | ICD-10-CM | POA: Insufficient documentation

## 2020-11-05 DIAGNOSIS — E538 Deficiency of other specified B group vitamins: Secondary | ICD-10-CM | POA: Insufficient documentation

## 2020-11-05 NOTE — Progress Notes (Signed)
  Subjective:  Patient ID: Nancy Webster, female    DOB: 12/22/1927,  MRN: [id number]  Chief Complaint  Patient presents with  . Nail Problem    Nail trim 1-5 bilateral   87 y.o. female presents with the above complaint. History confirmed with patient.   Objective:  Physical Exam: warm, good capillary refill, nail exam onychomycosis of the toenails, no trophic changes or ulcerative lesions. DP pulses palpable and protective sensation intact Hammertoes bilateral foot. HPK medial hallux left, 1st met, right hallux IPJ  No images are attached to the encounter.  Assessment:   1. Diabetes mellitus type 2 with peripheral artery disease ([place])   2. Onychomycosis   3. Callus    Plan:  Patient was evaluated and treated and all questions answered.  Onychomycosis, Diabetes and PAD -Patient is diabetic with a qualifying condition for at risk foot care. -Educated on DM Footcare.  Procedure: Nail Debridement Type of Debridement: manual, sharp debridement. Instrumentation: Nail nipper, rotary burr. Number of Nails: 10  Procedure: Paring of Lesion Rationale: painful hyperkeratotic lesion Type of Debridement: manual, sharp debridement. Instrumentation: 312 blade Number of Lesions: 3  Return in about 3 months (around 02/03/2021) for Diabetic Foot Care.

## 2020-11-18 DIAGNOSIS — H26491 Other secondary cataract, right eye: Secondary | ICD-10-CM | POA: Diagnosis not present

## 2020-12-02 DIAGNOSIS — R42 Dizziness and giddiness: Secondary | ICD-10-CM | POA: Diagnosis not present

## 2020-12-02 DIAGNOSIS — I129 Hypertensive chronic kidney disease with stage 1 through stage 4 chronic kidney disease, or unspecified chronic kidney disease: Secondary | ICD-10-CM | POA: Diagnosis not present

## 2020-12-10 ENCOUNTER — Ambulatory Visit: Payer: Medicare Other | Attending: Geriatric Medicine | Admitting: Physical Therapy

## 2020-12-10 ENCOUNTER — Encounter: Payer: Self-pay | Admitting: Physical Therapy

## 2020-12-10 ENCOUNTER — Other Ambulatory Visit: Payer: Self-pay

## 2020-12-10 VITALS — BP 150/80

## 2020-12-10 DIAGNOSIS — R262 Difficulty in walking, not elsewhere classified: Secondary | ICD-10-CM | POA: Insufficient documentation

## 2020-12-10 DIAGNOSIS — H8111 Benign paroxysmal vertigo, right ear: Secondary | ICD-10-CM | POA: Diagnosis not present

## 2020-12-10 DIAGNOSIS — R42 Dizziness and giddiness: Secondary | ICD-10-CM | POA: Insufficient documentation

## 2020-12-10 DIAGNOSIS — R2681 Unsteadiness on feet: Secondary | ICD-10-CM | POA: Diagnosis not present

## 2020-12-12 NOTE — Therapy (Signed)
[facility] [address] [place], [place], [address] Phone: [phone]   Fax:  [phone]  Physical Therapy Evaluation  Patient Details  Name: Nancy Webster MRN: [id number] Date of Birth: 85-17-1929 Referring Provider (PT): Lajean Manes, MD   Encounter Date: 85/08/2021   PT End of Session - 12/12/20 1308    Visit Number 1    Number of Visits 9    Date for PT Re-Evaluation 02/10/21    Authorization Type Medicare/BCBS Supplement Covered 100%    Progress Note Due on Visit 10    PT Start Time 1233    PT Stop Time 1315    PT Time Calculation (min) 42 min    Activity Tolerance Patient tolerated treatment well    Behavior During Therapy [facility] for tasks assessed/performed           Past Medical History:  Diagnosis Date  . Anxiety   . Coronary artery disease   . Diabetes mellitus without complication ([place])   . GERD (gastroesophageal reflux disease)   . Hypertension   . Renal disorder    stage 3    Past Surgical History:  Procedure Laterality Date  . CHOLECYSTECTOMY N/A 03/02/2018   Procedure: LAPAROSCOPIC CHOLECYSTECTOMY;  Surgeon: Greer Pickerel, MD;  Location: [place];  Service: General;  Laterality: N/A;  . ERCP N/A 06/26/2019   Procedure: ENDOSCOPIC RETROGRADE CHOLANGIOPANCREATOGRAPHY (ERCP);  Surgeon: [name], MD;  Location: [place];  Service: Gastroenterology;  Laterality: N/A;  . SPHINCTEROTOMY  06/26/2019   Procedure: SPHINCTEROTOMY;  Surgeon: [name], MD;  Location: [facility] ENDOSCOPY;  Service: Gastroenterology;;    Vitals:   12/10/20 1316  BP: (!) 150/80     12/10/20 1246  Symptoms/Limitations  Subjective Pt suprised to be at physical therapist office; reports she does not have spinning.  Reports her biggest impairment is imbalance/veering and shakyness that began about 6-7 years ago; had intermittent episodes.  Reports a long history of eustacian tube dysfunction, R ear fullness, sinus surgery  and trigeminal neuralgia.  Doesn't want the shakyness to get worse.  Pertinent History Atherosclerosis of aorta, type 2 DM with PN, HTN renal disease, diabetic retinopathy, stage 3a CKD  Patient Stated Goals To prevent the shakyness to get worse  Pain Assessment  Currently in Pain? No/denies     12/10/20 1250  Assessment  Medical Diagnosis Dizziness/imbalance  Referring Provider (PT) Lajean Manes, MD  Onset Date/Surgical Date 12/03/20  Prior Therapy not for dizziness  Precautions  Precautions Other (comment)  Precaution Comments Atherosclerosis of aorta, type 2 DM with PN, HTN renal disease, diabetic retinopathy, stage 3a CKD  Balance Screen  Has the patient fallen in the past 6 months No  [place] residence  [place] in [place].  Has elevator but pt chooses to use the stairs.  Not driving anymore  Prior Function  Level of Independence Independent  Observation/Other Assessments  Focus on Therapeutic Outcomes (FOTO)  Dizziness Functional Status: 52%; Dizziness Positional Status: 63.0  Sensation  Light Touch Appears Intact  Tone  Assessment Location RLE;LLE  Deep Tendon Reflexes  DTR Assessment Site Patella  Patella DTR normal  ROM / Strength  AROM / PROM / Strength Strength  Strength  Overall Strength Within functional limits for tasks performed  RLE Tone  RLE Tone WFL  LLE Tone  LLE Tone [facility]  12/10/20 1256  Symptom Behavior  Subjective history of current problem Pt denies spinning; reports a sense of "shakyness".  Denies recent changes in vision or hearing.  Denies nausea or vomiting.  Type of Dizziness  Imbalance (shaky)  Frequency of Dizziness daily  Duration of Dizziness constant  Symptom Nature Constant  Aggravating Factors No known aggravating factors  Relieving Factors Comments (very relaxed)   Progression of Symptoms Worse  Oculomotor Exam  Oculomotor Alignment Abnormal  Ocular ROM WFL  Spontaneous Absent  Gaze-induced  Absent  Smooth Pursuits Intact  Saccades Intact  Comment Exophoria with cover-uncover  Oculomotor Exam-Fixation Suppressed   Left Head Impulse negative  Right Head Impulse positive  Vestibulo-Ocular Reflex  VOR to Slow Head Movement Normal  VOR Cancellation Normal  Positional Testing  Dix-Hallpike Dix-Hallpike Left;Dix-Hallpike Right  Horizontal Canal Testing Horizontal Canal Right;Horizontal Canal Left  Dix-Hallpike Right  Dix-Hallpike Right Duration 60 seconds  Dix-Hallpike Right Symptoms Upbeat, right rotatory nystagmus (low amplitude)  Dix-Hallpike Left  Dix-Hallpike Left Duration 0  Dix-Hallpike Left Symptoms No nystagmus  Horizontal Canal Right  Horizontal Canal Right Duration 0  Horizontal Canal Right Symptoms Normal  Horizontal Canal Left  Horizontal Canal Left Duration 0  Horizontal Canal Left Symptoms Normal  Orthostatics  BP sitting 170/105  HR sitting 78 (irregular)  Orthostatics Comment Reports having jaw tightness        Objective measurements completed on examination: See above findings.               PT Education - 12/12/20 1307    Education Details clinical findings, PT POC and goals    Person(s) Educated Patient    Methods Explanation    Comprehension Verbalized understanding            PT Short Term Goals - 12/12/20 1321      PT SHORT TERM GOAL #1   Title Pt will participate in further assesment of BP with orthostatic assessment    Time 4    Period Weeks    Status New    Target Date 01/11/21      PT SHORT TERM GOAL #2   Title Pt will participate in further assessment of motion sensitivity and will initiate vestibular HEP    Time 4    Period Weeks    Status New    Target Date 01/11/21      PT SHORT TERM GOAL #3   Title Pt will participate in treatment for R posterior canal cupulolithiasis     Time 4    Period Weeks    Status New    Target Date 01/11/21      PT SHORT TERM GOAL #4   Title Pt will participate in further assessment of falls risk with FGA and will initiate balance HEP    Time 4    Period Weeks    Status New    Target Date 01/11/21             PT Long Term Goals - 12/12/20 1323      PT LONG TERM GOAL #1   Title Pt will report improvement in DFS to >/= 60 and improvement in DPS by 5 points on FOTO    Baseline DFS: 52; DPS: 63.0    Time 8    Period Weeks    Status New    Target Date 02/10/21      PT LONG TERM GOAL #2   Title Pt will demonstrate independence with final vestibular and balance  HEP    Time 8    Period Weeks    Status New    Target Date 02/10/21      PT LONG TERM GOAL #3   Title Pt will demonstrate negative positional testing    Baseline R post canal cupulolithiasis    Time 8    Period Weeks    Status New    Target Date 02/10/21      PT LONG TERM GOAL #4   Title Pt will demonstrate 4 point improvement in FGA to indicate decreased veering and risk for falls    Baseline TBD    Time 8    Period Weeks    Status New    Target Date 02/10/21      PT LONG TERM GOAL #5   Title Pt will report no sensitivity to movements on MSQ    Baseline TBD    Time 8    Period Weeks    Status New    Target Date 02/10/21                  Plan - 12/12/20 1308    Clinical Impression Statement Pt is a 87 year old female referred to Neuro OPPT for evaluation of dizziness and imbalance.  Pt's PMH is significant for the following: Atherosclerosis of aorta, type 2 DM with PN, HTN renal disease, diabetic retinopathy, stage 3a CKD. The following deficits were noted during pt's exam: abnormal oculomotor alignment, positive head impulse test to the Right indicating vestibular hypofunction, persistent upbeating, R rotary nystagmus during R dix-hallpike indicating R posterior canal cupulolithiasis, impaired balance and difficulty walking.  Pt also  experienced elevated BP along with tremors after positional testing that settled after 2-3 minute sitting break.  Will continue to monitor to assess if symptoms have a cardiac component.  Pt lives alone in an independent living apartment and is at increased risk for falls. Pt would benefit from skilled PT to address these impairments and functional limitations to maximize functional mobility independence and reduce falls risk.    Personal Factors and Comorbidities Age;Comorbidity 3+;Finances;Social Background;Time since onset of injury/illness/exacerbation;Past/Current Experience    Comorbidities Atherosclerosis of aorta, type 2 DM with PN, HTN renal disease, diabetic retinopathy, stage 3a CKD    Examination-Activity Limitations Bed Mobility;Bend;Locomotion Level;Reach Overhead;Stand    Curator Evolving/Moderate complexity    Clinical Decision Making Moderate    Rehab Potential Good    PT Frequency 1x / week    PT Duration 8 weeks    PT Treatment/Interventions ADLs/Self Care Home Management;Canalith Repostioning;Gait training;Stair training;Functional mobility training;Patient/family education;Therapeutic activities;Therapeutic exercise;Balance training;Neuromuscular re-education;Vestibular    PT Next Visit Plan Check orthostatics; check MSQ and FGA - reset baselines.  Treat R cupulo.  Initiate HEP for VOR, balance, habituation    Consulted and Agree with Plan of Care Patient           Patient will benefit from skilled therapeutic intervention in order to improve the following deficits and impairments:  Decreased balance,Difficulty walking,Dizziness  Visit Diagnosis: BPPV (benign paroxysmal positional vertigo), right  Dizziness and giddiness  Unsteadiness on feet  Difficulty in walking, not elsewhere classified     Problem List Patient Active Problem List   Diagnosis Date Noted  . Carotid bruit  11/05/2020  . CHF (congestive heart failure) ([place]) 11/05/2020  . Cataract of both eyes 11/05/2020  . Chronic kidney disease, stage 3a ([place]) 11/05/2020  . Diabetic neuropathy with neurologic  complication ([place]) 02/40/9735  . Diabetic renal disease ([place]) 11/05/2020  . Hypertensive renal disease 11/05/2020  . Diabetic retinopathy associated with type 2 diabetes mellitus ([place]) 11/05/2020  . Hardening of the aorta (main artery of the heart) ([place]) 11/05/2020  . Hypercholesterolemia 11/05/2020  . Hyperlipidemia 11/05/2020  . Lumbar radiculopathy 11/05/2020  . Vitamin B 12 deficiency 11/05/2020  . Vitamin D deficiency 11/05/2020  . Abdominal pain 06/24/2019  . Acute pancreatitis 06/24/2019  . AKI (acute kidney injury) ([place]) 03/01/2018  . Essential hypertension 03/01/2018  . Paroxysmal SVT (supraventricular tachycardia) ([place]) 03/01/2018  . Diabetes mellitus ([place]) 03/01/2018  . Acute cholecystitis 03/01/2018  . Trigeminal neuralgia 03/01/2018  . Generalized anxiety disorder 03/01/2018  . ETD (Eustachian tube dysfunction), bilateral 01/22/2018  . Rhinitis, chronic 01/22/2018  . Arthritis 02/05/2017  . Major depressive disorder with current active episode 02/05/2017  . Gastroesophageal reflux disease with esophagitis 07/04/2016  . Malaise and fatigue 03/06/2016  . Chronic fatigue 01/26/2016  . Wide QRS ventricular tachycardia ([place]) 10/06/2015  . Shaky 09/01/2015  . Dyspnea 08/31/2015  . Keratitis sicca, bilateral ([place]) 05/07/2012  . MGD (meibomian gland disease) 05/07/2012  . Open-angle glaucoma 05/07/2012  . Family history of coronary artery disease 05/16/2011  . Lipid disorder 05/16/2011    Rico Junker, PT, DPT 12/12/20    1:28 PM    [facility] [address] [place], [place], [address] Phone: [phone]   Fax:  [phone]  Name: ARTEMIS KOLLER MRN: [id number] Date of Birth: 03-05-1928

## 2020-12-15 NOTE — Progress Notes (Signed)
Cardiology Office Note   Date:  12/17/2020   ID:  Nancy Webster, DOB August 28, 1928, MRN [id number]  PCP:  Lajean Manes, MD  Cardiologist:   Dorris Carnes, MD   Patient presents for follow-up of SVT.    History of Present Illness: Nancy Webster is a 87 y.o. female with a history of SVT, DM (she says dx around age 73 yo), HTN, HL, GERD, CKD, trigeminal neuralgia (s/p gamma knife).   She aw previously followed in [place]   Now in [place]  She is followed by [employer] I last saw the pt in clinic in Dec 2021    The pt has had problems with feeling shaky, wobbly   She was seen by PA in Dr Carlyle Lipa office   Referred to neuro rehab for possible vertigo .   BP very high at that check   Orthostatics done   (cannot find)  Told to follow up in cardiology     The pt says she is unsteady   Has not fallen  No syncope Breathing is OK  She deneis palpitations   NO CP   Current Meds  Medication Sig  . acetaminophen (TYLENOL) 500 MG tablet Take 500 mg by mouth every 8 (eight) hours as needed for mild pain or headache.  . ALPRAZolam (XANAX) 0.5 MG tablet Take 1-2 tablets by mouth as needed for anxiety  . benazepril (LOTENSIN) 10 MG tablet Take 20 mg by mouth daily.   . Biotin 5000 MCG TABS Take 1 tablet by mouth daily.  . Blood Glucose Monitoring Suppl (FREESTYLE LITE) DEVI Dx: E11.9  . Cholecalciferol (VITAMIN D3) 1.25 MG (50000 UT) TABS Take 1 tablet by mouth daily.  . furosemide (LASIX) 40 MG tablet Take 20 mg by mouth daily.  Marland Kitchen gabapentin (NEURONTIN) 100 MG capsule Take 100 mg by mouth at bedtime.  Marland Kitchen glucose blood (FREESTYLE LITE) test strip Dx: E11.9  . ibuprofen (ADVIL) 200 MG tablet Take 200 mg by mouth as needed.  . isosorbide mononitrate (IMDUR) 30 MG 24 hr tablet Take 30 mg by mouth daily.  Marland Kitchen MEGARED OMEGA-3 KRILL OIL PO Take 1 tablet by mouth daily.  . metFORMIN (GLUCOPHAGE) 1000 MG tablet Take 500 mg by mouth 2 (two) times daily.  . metoprolol succinate (TOPROL-XL) 25 MG 24 hr tablet Take 25 mg  by mouth daily.  . Multiple Vitamins-Minerals (CENTRUM SILVER ADULT 50+) TABS Take 1 tablet by mouth daily.  . pantoprazole (PROTONIX) 40 MG tablet Take 40 mg by mouth daily.  Vladimir Faster Glycol-Propyl Glycol (SYSTANE) 0.4-0.3 % SOLN Place 1 drop into both eyes 2 (two) times daily.  . sodium chloride (OCEAN) 0.65 % SOLN nasal spray Place 1 spray into both nostrils as needed for congestion.     Allergies:   Atorvastatin, Other, and Statins   Past Medical History:  Diagnosis Date  . Anxiety   . Coronary artery disease   . Diabetes mellitus without complication ([place])   . GERD (gastroesophageal reflux disease)   . Hypertension   . Renal disorder    stage 3    Past Surgical History:  Procedure Laterality Date  . CHOLECYSTECTOMY N/A 03/02/2018   Procedure: LAPAROSCOPIC CHOLECYSTECTOMY;  Surgeon: Greer Pickerel, MD;  Location: [place];  Service: General;  Laterality: N/A;  . ERCP N/A 06/26/2019   Procedure: ENDOSCOPIC RETROGRADE CHOLANGIOPANCREATOGRAPHY (ERCP);  Surgeon: [name], MD;  Location: [place];  Service: Gastroenterology;  Laterality: N/A;  . SPHINCTEROTOMY  06/26/2019   Procedure: SPHINCTEROTOMY;  Surgeon: [name], MD;  Location: [facility] ENDOSCOPY;  Service: Gastroenterology;;     Social History:  The patient  reports that she has never smoked. She has never used smokeless tobacco. She reports previous alcohol use. She reports that she does not use drugs.   Family History:  The patient's family history includes Heart failure in her mother.    ROS:  Please see the history of present illness. All other systems are reviewed and  Negative to the above problem except as noted.    PHYSICAL EXAM: VS:  BP (!) 118/50   Pulse 70   Ht 5\' 3"  (1.6 m)   Wt 151 lb 3.2 oz (68.6 kg)   SpO2 97%   BMI 26.78 kg/m   Orthostatics:   BPlaying   152/66  P 72  Sitting   156/68  P 72   Standing   140/64  P 76   Standing 4 min 132/60  P 76     GEN: Well nourished, well developed, in no  acute distress  HEENT: normal  Neck: no JVD, carotid bruits, Cardiac: RRR; no murmurs, ,no edema  Respiratory:  clear to auscultation bilaterally GI: soft, nontender, nondistended, + BS  No hepatomegaly  MS: no deformity Moving all extremities   Skin: warm and dry, no rash Neuro:  Strength and sensation are intact Psych: euthymic mood, full affect   EKG:  EKG is not  ordered today.    Lipid Panel    Component Value Date/Time   CHOL 193 06/24/2019 2207   TRIG 130 06/24/2019 2207   HDL 48 06/24/2019 2207   CHOLHDL 4.0 06/24/2019 2207   VLDL 26 06/24/2019 2207   LDLCALC 119 (H) 06/24/2019 2207      Wt Readings from Last 3 Encounters:  12/17/20 151 lb 3.2 oz (68.6 kg)  10/08/20 150 lb (68 kg)  10/06/19 147 lb (66.7 kg)      ASSESSMENT AND PLAN:  1  Unsteadiness   Pt With 20 pt drop in BP with standing    I would recomm cutting back on BP meds a little   Decrease lasix to 10 mg per day  Decease metoprolol to 12.5 mg per day    Will check BMET, CBC and UA today   Plan for f/u in 2 wks     2  Hx SVT  No symptoms to sugg recurrence   Will continue on toprol but at lower dose  43  HTN   Changes as noted above   Her BP is up and down   I am afraid of hypotension  And possble fall   Reassess in 2 wks     4  HL  Not tolerant to statins      Current medicines are reviewed at length with the patient today.  The patient does not have concerns regarding medicines.  Signed, Dorris Carnes, MD  12/17/2020 10:26 AM    [facility] [place], [place], [place]  [address] Phone: [phone]; Fax: [phone]

## 2020-12-17 ENCOUNTER — Encounter: Payer: Self-pay | Admitting: Internal Medicine

## 2020-12-17 ENCOUNTER — Other Ambulatory Visit: Payer: Self-pay

## 2020-12-17 ENCOUNTER — Ambulatory Visit (INDEPENDENT_AMBULATORY_CARE_PROVIDER_SITE_OTHER): Payer: Medicare Other | Admitting: Internal Medicine

## 2020-12-17 VITALS — BP 118/50 | HR 70 | Ht 63.0 in | Wt 151.2 lb

## 2020-12-17 DIAGNOSIS — I10 Essential (primary) hypertension: Secondary | ICD-10-CM | POA: Diagnosis not present

## 2020-12-17 DIAGNOSIS — R29898 Other symptoms and signs involving the musculoskeletal system: Secondary | ICD-10-CM | POA: Diagnosis not present

## 2020-12-17 DIAGNOSIS — I951 Orthostatic hypotension: Secondary | ICD-10-CM

## 2020-12-17 LAB — URINALYSIS
Bilirubin, UA: NEGATIVE
Glucose, UA: NEGATIVE
Ketones, UA: NEGATIVE
Leukocytes,UA: NEGATIVE
Nitrite, UA: NEGATIVE
Protein,UA: NEGATIVE
RBC, UA: NEGATIVE
Specific Gravity, UA: 1.02 (ref 1.005–1.030)
Urobilinogen, Ur: 0.2 mg/dL (ref 0.2–1.0)
pH, UA: 6 (ref 5.0–7.5)

## 2020-12-17 LAB — BASIC METABOLIC PANEL
BUN/Creatinine Ratio: 24 (ref 12–28)
BUN: 26 mg/dL (ref 10–36)
CO2: 22 mmol/L (ref 20–29)
Calcium: 10.3 mg/dL (ref 8.7–10.3)
Chloride: 99 mmol/L (ref 96–106)
Creatinine, Ser: 1.07 mg/dL — ABNORMAL HIGH (ref 0.57–1.00)
GFR calc Af Amer: 53 mL/min/{1.73_m2} — ABNORMAL LOW (ref >59)
GFR calc non Af Amer: 46 mL/min/{1.73_m2} — ABNORMAL LOW (ref >59)
Glucose: 130 mg/dL — ABNORMAL HIGH (ref 65–99)
Potassium: 4.7 mmol/L (ref 3.5–5.2)
Sodium: 139 mmol/L (ref 134–144)

## 2020-12-17 LAB — CBC
Hematocrit: 38.8 % (ref 34.0–46.6)
Hemoglobin: 12.7 g/dL (ref 11.1–15.9)
MCH: 30.8 pg (ref 26.6–33.0)
MCHC: 32.7 g/dL (ref 31.5–35.7)
MCV: 94 fL (ref 79–97)
Platelets: 320 10*3/uL (ref 150–450)
RBC: 4.13 x10E6/uL (ref 3.77–5.28)
RDW: 12 % (ref 11.7–15.4)
WBC: 10.1 10*3/uL (ref 3.4–10.8)

## 2020-12-17 MED ORDER — FUROSEMIDE 20 MG PO TABS: 10.0000 mg | ORAL_TABLET | Freq: Every day | ORAL | 3 refills | Status: DC

## 2020-12-17 MED ORDER — METOPROLOL SUCCINATE ER 25 MG PO TB24: 12.5000 mg | ORAL_TABLET | Freq: Every day | ORAL | 3 refills | Status: DC

## 2020-12-17 NOTE — Patient Instructions (Signed)
Medication Instructions:  Your physician has recommended you make the following change in your medication:  1.) lasix (furosemide) - decrease to 10 mg daily (half of new prescription) 2.) metoprolol (Toprol XL) -decrease to 12.5 mg daily (half tablet)  *If you need a refill on your cardiac medications before your next appointment, please call your pharmacy*   Lab Work: Today: bmet, cbc, ua  If you have labs (blood work) drawn today and your tests are completely normal, you will receive your results only by: Marland Kitchen MyChart Message (if you have MyChart) OR . A paper copy in the mail If you have any lab test that is abnormal or we need to change your treatment, we will call you to review the results.   Testing/Procedures: none   Follow-Up: At [facility], you and your health needs are our priority.  As part of our continuing mission to provide you with exceptional heart care, we have created designated Provider Care Teams.  These Care Teams include your primary Cardiologist (physician) and Advanced Practice Providers (APPs -  Physician Assistants and Nurse Practitioners) who all work together to provide you with the care you need, when you need it.  We recommend signing up for the patient portal called "MyChart".  Sign up information is provided on this After Visit Summary.  MyChart is used to connect with patients for Virtual Visits (Telemedicine).  Patients are able to view lab/test results, encounter notes, upcoming appointments, etc.  Non-urgent messages can be sent to your provider as well.   To learn more about what you can do with MyChart, go to [email or web address].    Your next appointment:   2 week(s)  The format for your next appointment:   In Person  Provider:   You may see Dorris Carnes, MD or one of the following Advanced Practice Providers on your designated Care Team:    Richardson Dopp, PA-C  Robbie Lis, [place]    Other Instructions

## 2020-12-24 ENCOUNTER — Ambulatory Visit: Payer: Medicare Other | Admitting: Physical Therapy

## 2020-12-24 ENCOUNTER — Other Ambulatory Visit: Payer: Self-pay

## 2020-12-24 VITALS — BP 140/80 | HR 68

## 2020-12-24 DIAGNOSIS — R2681 Unsteadiness on feet: Secondary | ICD-10-CM | POA: Diagnosis not present

## 2020-12-24 DIAGNOSIS — R42 Dizziness and giddiness: Secondary | ICD-10-CM | POA: Diagnosis not present

## 2020-12-24 DIAGNOSIS — R262 Difficulty in walking, not elsewhere classified: Secondary | ICD-10-CM

## 2020-12-24 DIAGNOSIS — H8111 Benign paroxysmal vertigo, right ear: Secondary | ICD-10-CM

## 2020-12-24 NOTE — Therapy (Signed)
[facility] [address] [place], [place], [address] Phone: [phone]   Fax:  [phone]  Physical Therapy Treatment  Patient Details  Name: Nancy Webster MRN: [id number] Date of Birth: Mar 12, 1928 Referring Provider (PT): Lajean Manes, MD   Encounter Date: 12/24/2020   PT End of Session - 12/24/20 1241    Visit Number 2    Number of Visits 9    Date for PT Re-Evaluation 02/10/21    Authorization Type Medicare/BCBS Supplement Covered 100%    Progress Note Due on Visit 10    PT Start Time 1150    PT Stop Time 1235    PT Time Calculation (min) 45 min    Activity Tolerance Patient tolerated treatment well    Behavior During Therapy [facility] for tasks assessed/performed           Past Medical History:  Diagnosis Date  . Anxiety   . Coronary artery disease   . Diabetes mellitus without complication ([place])   . GERD (gastroesophageal reflux disease)   . Hypertension   . Renal disorder    stage 3    Past Surgical History:  Procedure Laterality Date  . CHOLECYSTECTOMY N/A 03/02/2018   Procedure: LAPAROSCOPIC CHOLECYSTECTOMY;  Surgeon: Greer Pickerel, MD;  Location: [place];  Service: General;  Laterality: N/A;  . ERCP N/A 06/26/2019   Procedure: ENDOSCOPIC RETROGRADE CHOLANGIOPANCREATOGRAPHY (ERCP);  Surgeon: [name], MD;  Location: [facility];  Service: Gastroenterology;  Laterality: N/A;  . SPHINCTEROTOMY  06/26/2019   Procedure: SPHINCTEROTOMY;  Surgeon: [name], MD;  Location: [facility];  Service: Gastroenterology;;    Vitals:   12/24/20 1159  BP: 140/80  Pulse: 68  SpO2: 98%     Subjective Assessment - 12/24/20 1152    Subjective Shoulder was a little sore after last sore.  Went to see cardiology and they made changes to two medications; pt reports she is feeling a little better.  Less shaky but is still off balance.    Pertinent History Atherosclerosis of aorta, type 2 DM with PN, HTN renal  disease, diabetic retinopathy, stage 3a CKD    Patient Stated Goals To prevent the shakyness to get worse    Currently in Pain? No/denies              [facility] PT Assessment - 12/24/20 1159      Functional Gait  Assessment   Gait assessed  Yes    Gait Level Surface Walks 20 ft in less than 5.5 sec, no assistive devices, good speed, no evidence for imbalance, normal gait pattern, deviates no more than 6 in outside of the 12 in walkway width.    Change in Gait Speed Able to smoothly change walking speed without loss of balance or gait deviation. Deviate no more than 6 in outside of the 12 in walkway width.    Gait with Horizontal Head Turns Performs head turns smoothly with slight change in gait velocity (eg, minor disruption to smooth gait path), deviates 6-10 in outside 12 in walkway width, or uses an assistive device.    Gait with Vertical Head Turns Performs task with slight change in gait velocity (eg, minor disruption to smooth gait path), deviates 6 - 10 in outside 12 in walkway width or uses assistive device    Gait and Pivot Turn Pivot turns safely within 3 sec and stops quickly with no loss of balance.    Step Over Obstacle Is able to step over one shoe  box (4.5 in total height) but must slow down and adjust steps to clear box safely. May require verbal cueing.    Gait with Narrow Base of Support Ambulates 4-7 steps.    Gait with Eyes Closed Walks 20 ft, slow speed, abnormal gait pattern, evidence for imbalance, deviates 10-15 in outside 12 in walkway width. Requires more than 9 sec to ambulate 20 ft.    Ambulating Backwards Walks 20 ft, uses assistive device, slower speed, mild gait deviations, deviates 6-10 in outside 12 in walkway width.    Steps Alternating feet, must use rail.    Total Score 20    FGA comment: 20/30               Vestibular Assessment - 12/24/20 1200      Balancemaster   Balancemaster Comment MCTSIB: 30 seconds for condition 1, 2, and 3.  Condition 4: 2  seconds (total score of 92).      Positional Sensitivities   Sit to Supine Lightheadedness    Supine to Left Side Lightheadedness    Supine to Right Side Lightheadedness    Supine to Sitting Lightheadedness    Right Hallpike Mild dizziness    Up from Right Hallpike Mild dizziness    Up from Left Hallpike Mild dizziness    Nose to Right Knee No dizziness    Right Knee to Sitting No dizziness    Nose to Left Knee No dizziness    Left Knee to Sitting No dizziness    Head Turning x 5 No dizziness    Head Nodding x 5 Mild dizziness    Pivot Right in Standing Mild dizziness    Pivot Left in Standing No dizziness    Rolling Right Lightheadedness    Rolling Left Lightheadedness                            PT Education - 12/24/20 1241    Education Details clinical findings, plan for next visit    Person(s) Educated Patient    Methods Explanation    Comprehension Verbalized understanding            PT Short Term Goals - 12/24/20 1246      PT SHORT TERM GOAL #1   Title Pt will participate in further assesment of BP with orthostatic assessment    Baseline performed at cardiology office    Time 4    Period Weeks    Status Achieved    Target Date 01/11/21      PT SHORT TERM GOAL #2   Title Pt will participate in further assessment of motion sensitivity and will initiate vestibular HEP    Time 4    Period Weeks    Status Achieved    Target Date 01/11/21      PT SHORT TERM GOAL #3   Title Pt will participate in treatment for R posterior canal cupulolithiasis    Time 4    Period Weeks    Status New    Target Date 01/11/21      PT SHORT TERM GOAL #4   Title Pt will participate in further assessment of falls risk with FGA and will initiate balance HEP    Time 4    Period Weeks    Status Achieved    Target Date 01/11/21             PT Long Term Goals - 12/24/20 1247  PT LONG TERM GOAL #1   Title Pt will report improvement in DFS to >/= 60 and  improvement in DPS by 5 points on FOTO    Baseline DFS: 52; DPS: 63.0    Time 8    Period Weeks    Status New    Target Date 02/10/21      PT LONG TERM GOAL #2   Title Pt will demonstrate independence with final vestibular and balance HEP    Time 8    Period Weeks    Status New    Target Date 02/10/21      PT LONG TERM GOAL #3   Title Pt will demonstrate negative positional testing    Baseline R post canal cupulolithiasis    Time 8    Period Weeks    Status New    Target Date 02/10/21      PT LONG TERM GOAL #4   Title Pt will demonstrate 4 point improvement in FGA to indicate decreased veering and risk for falls    Baseline 20/30    Time 8    Period Weeks    Status Revised    Target Date 02/10/21      PT LONG TERM GOAL #5   Title Pt will report no sensitivity to movements on MSQ    Baseline bed mobility, vertical head movements and pivoting to R    Time 8    Period Weeks    Status Revised    Target Date 02/10/21                 Plan - 12/24/20 1241    Clinical Impression Statement Pt reporting decreased symptoms after BP medications changed but pt continues to report some "shakyness" with supine <> sit, rolling, vertical head movements and dix-hallpike when performing assessment of MSQ.  Pt also participated in assessment of multi-sensory integration and balance during dynamic gait challenges.  Pt has greatest difficulty with head movements during gait, vision removed, negotiating obstacles and narrow BOS.  Pt also demonstrated significant LOB when on compliant surface with vision removed indicating decreased vestibular function.  Will initiate vestibular and balance HEP next session.    Personal Factors and Comorbidities Age;Comorbidity 3+;Finances;Social Background;Time since onset of injury/illness/exacerbation;Past/Current Experience    Comorbidities Atherosclerosis of aorta, type 2 DM with PN, HTN renal disease, diabetic retinopathy, stage 3a CKD     Examination-Activity Limitations Bed Mobility;Bend;Locomotion Level;Reach Overhead;Stand    Curator Evolving/Moderate complexity    Rehab Potential Good    PT Frequency 1x / week    PT Duration 8 weeks    PT Treatment/Interventions ADLs/Self Care Home Management;Canalith Repostioning;Gait training;Stair training;Functional mobility training;Patient/family education;Therapeutic activities;Therapeutic exercise;Balance training;Neuromuscular re-education;Vestibular    PT Next Visit Plan How was follow up with cardiology?  Initiate HEP for VOR, balance, habituation.  Treat R cupulo.    Consulted and Agree with Plan of Care Patient           Patient will benefit from skilled therapeutic intervention in order to improve the following deficits and impairments:  Decreased balance,Difficulty walking,Dizziness  Visit Diagnosis: Dizziness and giddiness  BPPV (benign paroxysmal positional vertigo), right  Unsteadiness on feet  Difficulty in walking, not elsewhere classified     Problem List Patient Active Problem List   Diagnosis Date Noted  . Carotid bruit 11/05/2020  . CHF (congestive heart failure) ([place]) 11/05/2020  . Cataract of both eyes 11/05/2020  . Chronic  kidney disease, stage 3a ([place]) 11/05/2020  . Diabetic neuropathy with neurologic complication ([place]) 65/12/5463  . Diabetic renal disease ([place]) 11/05/2020  . Hypertensive renal disease 11/05/2020  . Diabetic retinopathy associated with type 2 diabetes mellitus ([place]) 11/05/2020  . Hardening of the aorta (main artery of the heart) ([place]) 11/05/2020  . Hypercholesterolemia 11/05/2020  . Hyperlipidemia 11/05/2020  . Lumbar radiculopathy 11/05/2020  . Vitamin B 12 deficiency 11/05/2020  . Vitamin D deficiency 11/05/2020  . Abdominal pain 06/24/2019  . Acute pancreatitis 06/24/2019  . AKI (acute kidney injury) ([place]) 03/01/2018  . Essential  hypertension 03/01/2018  . Paroxysmal SVT (supraventricular tachycardia) ([place]) 03/01/2018  . Diabetes mellitus ([place]) 03/01/2018  . Acute cholecystitis 03/01/2018  . Trigeminal neuralgia 03/01/2018  . Generalized anxiety disorder 03/01/2018  . ETD (Eustachian tube dysfunction), bilateral 01/22/2018  . Rhinitis, chronic 01/22/2018  . Arthritis 02/05/2017  . Major depressive disorder with current active episode 02/05/2017  . Gastroesophageal reflux disease with esophagitis 07/04/2016  . Malaise and fatigue 03/06/2016  . Chronic fatigue 01/26/2016  . Wide QRS ventricular tachycardia ([place]) 10/06/2015  . Shaky 09/01/2015  . Dyspnea 08/31/2015  . Keratitis sicca, bilateral ([place]) 05/07/2012  . MGD (meibomian gland disease) 05/07/2012  . Open-angle glaucoma 05/07/2012  . Family history of coronary artery disease 05/16/2011  . Lipid disorder 05/16/2011    Rico Junker, PT, DPT 12/24/20    12:48 PM    [facility] [address] [place], [place], [address] Phone: [phone]   Fax:  [phone]  Name: Nancy Webster MRN: [id number] Date of Birth: [birth year]

## 2020-12-28 NOTE — Progress Notes (Addendum)
Cardiology Office Note   Date:  12/29/2020   ID:  [name], DOB 18-Dec-1927, MRN [id number]   PCP:  Lajean Manes, MD  Cardiologist:   Dorris Carnes, MD   Patient presents for follow-up of dizziness    History of Present Illness: Nancy Webster is a 87 y.o. female with a history of SVT, DM (she says dx around age 46 yo), HTN, HL, GERD, CKD, trigeminal neuralgia (s/p gamma knife).   She aw previously followed in [place]   Now in [place]  She is followed by [employer] I saw the pt in Jan    At that visit she complained of feeling shaky, wobbly   She was seen by PA in Dr Carlyle Lipa office   Referred to neuro rehab for possible vertigo .   BP very high at that check   Orthostatics were  done and reported positive.    WHen I saw her she was orthostatic      I recomm that she cut back on lasix to 10 mg and metoprolol to 12.5 mg   Since seen she says she feels a little better, not as dizzy or wobbly  She denies CP   No falls The pt does not some palpitations, feeling heart pounding  Denies dizziness with spells   Current Meds  Medication Sig  . acetaminophen (TYLENOL) 500 MG tablet Take 500 mg by mouth every 8 (eight) hours as needed for mild pain or headache.  . ALPRAZolam (XANAX) 0.5 MG tablet Take 1-2 tablets by mouth as needed for anxiety  . benazepril (LOTENSIN) 10 MG tablet Take 20 mg by mouth daily.   . Biotin 5000 MCG TABS Take 1 tablet by mouth daily.  . Blood Glucose Monitoring Suppl (FREESTYLE LITE) DEVI Dx: E11.9  . Cholecalciferol (VITAMIN D3) 1.25 MG (50000 UT) TABS Take 1 tablet by mouth daily.  . furosemide (LASIX) 20 MG tablet Take 0.5 tablets (10 mg total) by mouth daily.  Marland Kitchen gabapentin (NEURONTIN) 100 MG capsule Take 100 mg by mouth at bedtime.  Marland Kitchen glucose blood (FREESTYLE LITE) test strip Dx: E11.9  . ibuprofen (ADVIL) 200 MG tablet Take 200 mg by mouth as needed.  . isosorbide mononitrate (IMDUR) 30 MG 24 hr tablet Take 30 mg by mouth daily.  Marland Kitchen MEGARED OMEGA-3 KRILL OIL PO  Take 1 tablet by mouth daily.  . metFORMIN (GLUCOPHAGE) 1000 MG tablet Take 500 mg by mouth 2 (two) times daily.  . metoprolol succinate (TOPROL XL) 25 MG 24 hr tablet Take 0.5 tablets (12.5 mg total) by mouth daily.  . Multiple Vitamins-Minerals (CENTRUM SILVER ADULT 50+) TABS Take 1 tablet by mouth daily.  . pantoprazole (PROTONIX) 40 MG tablet Take 40 mg by mouth daily.  Vladimir Faster Glycol-Propyl Glycol (SYSTANE) 0.4-0.3 % SOLN Place 1 drop into both eyes 2 (two) times daily.  . sodium chloride (OCEAN) 0.65 % SOLN nasal spray Place 1 spray into both nostrils as needed for congestion.     Allergies:   Atorvastatin, Other, and Statins   Past Medical History:  Diagnosis Date  . Anxiety   . Coronary artery disease   . Diabetes mellitus without complication ([place])   . GERD (gastroesophageal reflux disease)   . Hypertension   . Renal disorder    stage 3    Past Surgical History:  Procedure Laterality Date  . CHOLECYSTECTOMY N/A 03/02/2018   Procedure: LAPAROSCOPIC CHOLECYSTECTOMY;  Surgeon: Greer Pickerel, MD;  Location: [facility];  Service: General;  Laterality: N/A;  . ERCP N/A 06/26/2019   Procedure: ENDOSCOPIC RETROGRADE CHOLANGIOPANCREATOGRAPHY (ERCP);  Surgeon: [name], MD;  Location: [facility];  Service: Gastroenterology;  Laterality: N/A;  . SPHINCTEROTOMY  06/26/2019   Procedure: SPHINCTEROTOMY;  Surgeon: [name], MD;  Location: [facility] ENDOSCOPY;  Service: Gastroenterology;;     Social History:  The patient  reports that she has never smoked. She has never used smokeless tobacco. She reports previous alcohol use. She reports that she does not use drugs.   Family History:  The patient's family history includes Heart failure in her mother.    ROS:  Please see the history of present illness. All other systems are reviewed and  Negative to the above problem except as noted.    PHYSICAL EXAM: VS:  Ht 5\' 3"  (1.6 m)   Wt 153 lb 6.4 oz (69.6 kg)   SpO2 98%   BMI 27.17 kg/m    BP 136/76  P 76  O2 sat 98% BP laying 130/70   P 74   Sitting  128/68  P 78   Standing 128/70   P 81   Standing 142/70  P 83    GEN: Well nourished, well developed, in no acute distress  HEENT: normal  Neck: no JVD, carotid bruits, Cardiac: RRR; no murmurs, ,no LE edema  Respiratory:  clear to auscultation bilaterally GI: soft, nontender, nondistended, + BS  No hepatomegaly  MS: no deformity Moving all extremities   Skin: warm and dry, no rash Neuro:  Strength and sensation are intact Psych: euthymic mood, full affect   EKG:  EKG is not  ordered today.    Lipid Panel    Component Value Date/Time   CHOL 193 06/24/2019 2207   TRIG 130 06/24/2019 2207   HDL 48 06/24/2019 2207   CHOLHDL 4.0 06/24/2019 2207   VLDL 26 06/24/2019 2207   LDLCALC 119 (H) 06/24/2019 2207      Wt Readings from Last 3 Encounters:  12/29/20 153 lb 6.4 oz (69.6 kg)  12/17/20 151 lb 3.2 oz (68.6 kg)  10/08/20 150 lb (68 kg)      ASSESSMENT AND PLAN:  1  Unsteadiness   Pt is feeling a little better since cutting back on meds    Not as unsteady     I would keep on current reimgne    Checm BMET   2  Palpitations   Hx of SVT   Current spells do not sound like SVT   Will set up for an event monitor   Follow    Keep on curr  2  Hx SVT  No Documented recurrence  43  HTN   BP good at 136/76        4  HL  Not tolerant to statins      Current medicines are reviewed at length with the patient today.  The patient does not have concerns regarding medicines.  Signed, Dorris Carnes, MD  12/29/2020 10:43 PM    [facility] [place], [place], [place]  [address] Phone: [phone]; Fax: [phone]

## 2020-12-29 ENCOUNTER — Encounter: Payer: Self-pay | Admitting: Internal Medicine

## 2020-12-29 ENCOUNTER — Encounter: Payer: Self-pay | Admitting: Radiology

## 2020-12-29 ENCOUNTER — Ambulatory Visit (INDEPENDENT_AMBULATORY_CARE_PROVIDER_SITE_OTHER): Payer: Medicare Other | Admitting: Internal Medicine

## 2020-12-29 ENCOUNTER — Ambulatory Visit (INDEPENDENT_AMBULATORY_CARE_PROVIDER_SITE_OTHER): Payer: Medicare Other

## 2020-12-29 ENCOUNTER — Other Ambulatory Visit: Payer: Self-pay

## 2020-12-29 VITALS — Ht 63.0 in | Wt 153.4 lb

## 2020-12-29 DIAGNOSIS — R002 Palpitations: Secondary | ICD-10-CM

## 2020-12-29 DIAGNOSIS — I10 Essential (primary) hypertension: Secondary | ICD-10-CM | POA: Diagnosis not present

## 2020-12-29 NOTE — Patient Instructions (Signed)
Medication Instructions:  No changes today *If you need a refill on your cardiac medications before your next appointment, please call your pharmacy*   Lab Work: BMET  If you have labs (blood work) drawn today and your tests are completely normal, you will receive your results only by: Marland Kitchen MyChart Message (if you have MyChart) OR . A paper copy in the mail If you have any lab test that is abnormal or we need to change your treatment, we will call you to review the results.   Testing/Procedures: [place]- Long Term Monitor Instructions   Your physician has requested you wear your ZIO patch monitor 14 days.   This is a single patch monitor.  [employer] supplies one patch monitor per enrollment.  Additional stickers are not available.   Please do not apply patch if you will be having a Nuclear Stress Test, Echocardiogram, Cardiac CT, MRI, or Chest Xray during the time frame you would be wearing the monitor. The patch cannot be worn during these tests.  You cannot remove and re-apply the ZIO XT patch monitor.   Your ZIO patch monitor will be sent USPS Priority mail from [facility] directly to your home address. The monitor may also be mailed to a PO BOX if home delivery is not available.   It may take 3-5 days to receive your monitor after you have been enrolled.   Once you have received you monitor, please review enclosed instructions.  Your monitor has already been registered assigning a specific monitor serial # to you.   Applying the monitor   Hold abrader disc by orange tab.  Rub abrader in 40 strokes over left upper chest as indicated in your monitor instructions.   Clean area with 4 enclosed alcohol pads .  Use all pads to assure are is cleaned thoroughly.  Let dry.   Apply patch as indicated in monitor instructions.  Patch will be place under collarbone on left side of chest with arrow pointing upward.   Rub patch adhesive wings for 2 minutes.Remove white label marked "1".   Remove white label marked "2".  Rub patch adhesive wings for 2 additional minutes.   While looking in a mirror, press and release button in center of patch.  A small green light will flash 3-4 times .  This will be your only indicator the monitor has been turned on.     Do not shower for the first 24 hours.  You may shower after the first 24 hours.   Press button if you feel a symptom. You will hear a small click.  Record Date, Time and Symptom in the Patient Log Book.   When you are ready to remove patch, follow instructions on last 2 pages of Patient Log Book.  Stick patch monitor onto last page of Patient Log Book.   Place Patient Log Book in [place] box.  Use locking tab on box and tape box closed securely.  The [employer] has [employer] on it.  Please place in mailbox as soon as possible.  Your physician should have your test results approximately 7 days after the monitor has been mailed back to [facility].   Call [facility] at [phone] if you have questions regarding your ZIO XT patch monitor.  Call them immediately if you see an orange light blinking on your monitor.   If your monitor falls off in less than 4 days contact our Monitor department at [phone].  If your  monitor becomes loose or falls off after 4 days call [employer] at [phone] for suggestions on securing your monitor.    Follow-Up: Follow up with your physician will depend on test results.   Other Instructions

## 2020-12-29 NOTE — Progress Notes (Signed)
Enrolled patient for a 14 day Zio XT Monitor to be mailed to patients home  

## 2020-12-30 ENCOUNTER — Ambulatory Visit: Payer: Medicare Other | Attending: Geriatric Medicine | Admitting: Physical Therapy

## 2020-12-30 ENCOUNTER — Encounter: Payer: Self-pay | Admitting: Physical Therapy

## 2020-12-30 DIAGNOSIS — R2681 Unsteadiness on feet: Secondary | ICD-10-CM | POA: Diagnosis not present

## 2020-12-30 DIAGNOSIS — R262 Difficulty in walking, not elsewhere classified: Secondary | ICD-10-CM | POA: Insufficient documentation

## 2020-12-30 DIAGNOSIS — R42 Dizziness and giddiness: Secondary | ICD-10-CM | POA: Diagnosis not present

## 2020-12-30 LAB — BASIC METABOLIC PANEL
BUN/Creatinine Ratio: 25 (ref 12–28)
BUN: 31 mg/dL (ref 10–36)
CO2: 21 mmol/L (ref 20–29)
Calcium: 10.1 mg/dL (ref 8.7–10.3)
Chloride: 101 mmol/L (ref 96–106)
Creatinine, Ser: 1.24 mg/dL — ABNORMAL HIGH (ref 0.57–1.00)
Glucose: 99 mg/dL (ref 65–99)
Potassium: 4.7 mmol/L (ref 3.5–5.2)
Sodium: 143 mmol/L (ref 134–144)
eGFR: 41 mL/min/{1.73_m2} — ABNORMAL LOW (ref >59)

## 2020-12-30 NOTE — Addendum Note (Signed)
Addended by: Mendel Ryder on: 12/30/2020 03:53 PM   Modules accepted: Orders

## 2020-12-30 NOTE — Therapy (Signed)
[place] [address] [place], [place], [address] Phone: [phone]   Fax:  [phone]  Physical Therapy Treatment  Patient Details  Name: Nancy Webster MRN: [id number] Date of Birth: 1928/10/30 Referring Provider (PT): Lajean Manes, MD   Encounter Date: 12/30/2020   PT End of Session - 12/30/20 1505    Visit Number 3    Number of Visits 9    Date for PT Re-Evaluation 02/10/21    Authorization Type Medicare/BCBS Supplement Covered 100%    Progress Note Due on Visit 10    PT Start Time 1240    PT Stop Time 1320    PT Time Calculation (min) 40 min    Activity Tolerance Patient tolerated treatment well    Behavior During Therapy [facility] for tasks assessed/performed           Past Medical History:  Diagnosis Date  . Anxiety   . Coronary artery disease   . Diabetes mellitus without complication ([place])   . GERD (gastroesophageal reflux disease)   . Hypertension   . Renal disorder    stage 3    Past Surgical History:  Procedure Laterality Date  . CHOLECYSTECTOMY N/A 03/02/2018   Procedure: LAPAROSCOPIC CHOLECYSTECTOMY;  Surgeon: Greer Pickerel, MD;  Location: [place];  Service: General;  Laterality: N/A;  . ERCP N/A 06/26/2019   Procedure: ENDOSCOPIC RETROGRADE CHOLANGIOPANCREATOGRAPHY (ERCP);  Surgeon: [name], MD;  Location: [place];  Service: Gastroenterology;  Laterality: N/A;  . SPHINCTEROTOMY  06/26/2019   Procedure: SPHINCTEROTOMY;  Surgeon: [name], MD;  Location: [facility] ENDOSCOPY;  Service: Gastroenterology;;    There were no vitals filed for this visit.   Subjective Assessment - 12/30/20 1250    Subjective Saw cardiologist yesterday and will stay on same medication regime and will wear a heart monitor to check for palpitations.  Still a little shaky but tired today from not sleeping well.    Pertinent History Atherosclerosis of aorta, type 2 DM with PN, HTN renal disease, diabetic retinopathy,  stage 3a CKD    Patient Stated Goals To prevent the shakyness to get worse    Currently in Pain? No/denies                              Vestibular Treatment/Exercise - 12/30/20 1303      Vestibular Treatment/Exercise   Vestibular Treatment Provided Gaze    Gaze Exercises X1 Viewing Horizontal;X1 Viewing Vertical      X1 Viewing Horizontal   Foot Position seated    Reps 2    Comments 30 sec; mild shakiness      X1 Viewing Vertical   Foot Position seated    Reps 2    Comments 30 sec; no symptoms provoked              Balance Exercises - 12/30/20 1311      Balance Exercises: Standing   Standing Eyes Opened Solid surface;Other reps (comment);Other (comment)   Head turns R/L and Up/Down; 10 reps each   Turning Both;5 reps   90 degrees; no symptoms provoked   Marching Static;10 reps;Other (comment)   90 degree turns with continuous marching            PT Education - 12/30/20 1501    Education Details waiting until after completion of heart monitor to re-start fitness classes at [facility], educated about exercises and safety for HEP, potential reproduction of symptoms  when performing exercises, plan for next treatment session    Person(s) Educated Patient    Methods Explanation;Demonstration    Comprehension Verbalized understanding            PT Short Term Goals - 12/24/20 1246      PT SHORT TERM GOAL #1   Title Pt will participate in further assesment of BP with orthostatic assessment    Baseline performed at cardiology office    Time 4    Period Weeks    Status Achieved    Target Date 01/11/21      PT SHORT TERM GOAL #2   Title Pt will participate in further assessment of motion sensitivity and will initiate vestibular HEP    Time 4    Period Weeks    Status Achieved    Target Date 01/11/21      PT SHORT TERM GOAL #3   Title Pt will participate in treatment for R posterior canal cupulolithiasis    Time 4    Period Weeks    Status New     Target Date 01/11/21      PT SHORT TERM GOAL #4   Title Pt will participate in further assessment of falls risk with FGA and will initiate balance HEP    Time 4    Period Weeks    Status Achieved    Target Date 01/11/21             PT Long Term Goals - 12/24/20 1247      PT LONG TERM GOAL #1   Title Pt will report improvement in DFS to >/= 60 and improvement in DPS by 5 points on FOTO    Baseline DFS: 52; DPS: 63.0    Time 8    Period Weeks    Status New    Target Date 02/10/21      PT LONG TERM GOAL #2   Title Pt will demonstrate independence with final vestibular and balance HEP    Time 8    Period Weeks    Status New    Target Date 02/10/21      PT LONG TERM GOAL #3   Title Pt will demonstrate negative positional testing    Baseline R post canal cupulolithiasis    Time 8    Period Weeks    Status New    Target Date 02/10/21      PT LONG TERM GOAL #4   Title Pt will demonstrate 4 point improvement in FGA to indicate decreased veering and risk for falls    Baseline 20/30    Time 8    Period Weeks    Status Revised    Target Date 02/10/21      PT LONG TERM GOAL #5   Title Pt will report no sensitivity to movements on MSQ    Baseline bed mobility, vertical head movements and pivoting to R    Time 8    Period Weeks    Status Revised    Target Date 02/10/21                 Plan - 12/30/20 1507    Clinical Impression Statement Pt reports feeling of "lightheadedness" when transfering from supine to standing that lasts about 15 sec.  Initiated vestibular and balance HEP - tolerated exercises well with mild reproduction of symptoms noted most in R/L VORx1 and continuous marching with 90 degree full body turns.  No increase in "shakiness" present after completion  of exercises.  Assessment and treatment of BBPV to be performed next session.    Personal Factors and Comorbidities Age;Comorbidity 3+;Finances;Social Background;Time since onset of  injury/illness/exacerbation;Past/Current Experience    Comorbidities Atherosclerosis of aorta, type 2 DM with PN, HTN renal disease, diabetic retinopathy, stage 3a CKD    Examination-Activity Limitations Bed Mobility;Bend;Locomotion Level;Reach Overhead;Stand    Curator Evolving/Moderate complexity    Rehab Potential Good    PT Frequency 1x / week    PT Duration 8 weeks    PT Treatment/Interventions ADLs/Self Care Home Management;Canalith Repostioning;Gait training;Stair training;Functional mobility training;Patient/family education;Therapeutic activities;Therapeutic exercise;Balance training;Neuromuscular re-education;Vestibular    PT Next Visit Plan re-check and treat R cupulo?  check on exercises.  Continue discussion about continue PT or D/C?  Nustep    Consulted and Agree with Plan of Care Patient           Patient will benefit from skilled therapeutic intervention in order to improve the following deficits and impairments:  Decreased balance,Difficulty walking,Dizziness  Visit Diagnosis: Unsteadiness on feet  Dizziness and giddiness  Difficulty in walking, not elsewhere classified     Problem List Patient Active Problem List   Diagnosis Date Noted  . Carotid bruit 11/05/2020  . CHF (congestive heart failure) ([place]) 11/05/2020  . Cataract of both eyes 11/05/2020  . Chronic kidney disease, stage 3a ([place]) 11/05/2020  . Diabetic neuropathy with neurologic complication ([place]) 38/45/3646  . Diabetic renal disease ([place]) 11/05/2020  . Hypertensive renal disease 11/05/2020  . Diabetic retinopathy associated with type 2 diabetes mellitus ([place]) 11/05/2020  . Hardening of the aorta (main artery of the heart) ([place]) 11/05/2020  . Hypercholesterolemia 11/05/2020  . Hyperlipidemia 11/05/2020  . Lumbar radiculopathy 11/05/2020  . Vitamin B 12 deficiency 11/05/2020  . Vitamin D deficiency 11/05/2020   . Abdominal pain 06/24/2019  . Acute pancreatitis 06/24/2019  . AKI (acute kidney injury) ([place]) 03/01/2018  . Essential hypertension 03/01/2018  . Paroxysmal SVT (supraventricular tachycardia) ([place]) 03/01/2018  . Diabetes mellitus ([place]) 03/01/2018  . Acute cholecystitis 03/01/2018  . Trigeminal neuralgia 03/01/2018  . Generalized anxiety disorder 03/01/2018  . ETD (Eustachian tube dysfunction), bilateral 01/22/2018  . Rhinitis, chronic 01/22/2018  . Arthritis 02/05/2017  . Major depressive disorder with current active episode 02/05/2017  . Gastroesophageal reflux disease with esophagitis 07/04/2016  . Malaise and fatigue 03/06/2016  . Chronic fatigue 01/26/2016  . Wide QRS ventricular tachycardia ([place]) 10/06/2015  . Shaky 09/01/2015  . Dyspnea 08/31/2015  . Keratitis sicca, bilateral ([place]) 05/07/2012  . MGD (meibomian gland disease) 05/07/2012  . Open-angle glaucoma 05/07/2012  . Family history of coronary artery disease 05/16/2011  . Lipid disorder 05/16/2011    Rico Junker, PT, DPT 12/30/20    3:45 PM    [facility] [address] [place], [place], [address] Phone: [phone]   Fax:  [phone]  Name: Nancy Webster MRN: [id number] Date of Birth: 01-Apr-1928

## 2020-12-30 NOTE — Patient Instructions (Addendum)
Gaze Stabilization: Sitting    Keeping eyes on target on wall 3 feet away, and move head side to side for _30___ seconds. Repeat while moving head up and down for __30__ seconds. Do __2__ sessions per day.   Gaze Stabilization: Tip Card  1.Target must remain in focus, not blurry, and appear stationary while head is in motion. 2.Perform exercises with small head movements (45 to either side of midline). 3.Increase speed of head motion so long as target is in focus. 4.If you wear eyeglasses, be sure you can see target through lens (therapist will give specific instructions for bifocal / progressive lenses). 5.These exercises may provoke dizziness or nausea. Work through these symptoms. If too dizzy, slow head movement slightly. Rest between each exercise. 6.Exercises demand concentration; avoid distractions.   Marching in Place with Turns    Using a chair if necessary, march in place continuously while preforming a full body turn to the right and then to the left. Repeat __5__ times to each side. Do __2__ sessions per day.  [email or web address].[email or web address]     Feet Together, Head Motion - Eyes Open    With eyes open, feet together, move head slowly: up and down 10 times. Rest and then repeat side to side for 10 times.  Do 2 sessions per day.

## 2021-01-05 DIAGNOSIS — R002 Palpitations: Secondary | ICD-10-CM | POA: Diagnosis not present

## 2021-01-06 DIAGNOSIS — E1121 Type 2 diabetes mellitus with diabetic nephropathy: Secondary | ICD-10-CM | POA: Diagnosis not present

## 2021-01-06 DIAGNOSIS — G500 Trigeminal neuralgia: Secondary | ICD-10-CM | POA: Diagnosis not present

## 2021-01-06 DIAGNOSIS — E11319 Type 2 diabetes mellitus with unspecified diabetic retinopathy without macular edema: Secondary | ICD-10-CM | POA: Diagnosis not present

## 2021-01-06 DIAGNOSIS — N1831 Chronic kidney disease, stage 3a: Secondary | ICD-10-CM | POA: Diagnosis not present

## 2021-01-06 DIAGNOSIS — R42 Dizziness and giddiness: Secondary | ICD-10-CM | POA: Diagnosis not present

## 2021-01-06 DIAGNOSIS — I129 Hypertensive chronic kidney disease with stage 1 through stage 4 chronic kidney disease, or unspecified chronic kidney disease: Secondary | ICD-10-CM | POA: Diagnosis not present

## 2021-01-07 ENCOUNTER — Ambulatory Visit: Payer: Medicare Other | Admitting: Physical Therapy

## 2021-01-09 ENCOUNTER — Encounter: Payer: Self-pay | Admitting: Physical Therapy

## 2021-01-09 NOTE — Therapy (Signed)
[facility] [address] [place], [place], [address] Phone: [phone]   Fax:  [phone]  Patient Details  Name: Nancy Webster MRN: [id number] Date of Birth: 10/06/1928 Referring Provider:  No ref. provider found  Encounter Date: 01/09/2021  PHYSICAL THERAPY DISCHARGE SUMMARY  Visits from Start of Care: 3  Current functional level related to goals / functional outcomes: Unable to formally assess.  Pt contacted clinic to inform PT that physician "she doesnt need to do this right now until they figure out what is going on."  Pt cancelled remaining visits with PT.    Remaining deficits: Dizziness/Vertigo, imbalance   Education / Equipment: HEP  Plan: Patient agrees to discharge.  Patient goals were not met. Patient is being discharged due to the physician's request.  ?????         Audra F Potter, PT, DPT 01/09/21    6:26 PM    [facility] [address] [place], [place], [address] Phone: [phone]   Fax:  [phone] 

## 2021-01-14 ENCOUNTER — Ambulatory Visit: Payer: Medicare Other | Admitting: Physical Therapy

## 2021-01-20 DIAGNOSIS — R040 Epistaxis: Secondary | ICD-10-CM | POA: Diagnosis not present

## 2021-01-20 DIAGNOSIS — R251 Tremor, unspecified: Secondary | ICD-10-CM | POA: Diagnosis not present

## 2021-01-20 DIAGNOSIS — N1831 Chronic kidney disease, stage 3a: Secondary | ICD-10-CM | POA: Diagnosis not present

## 2021-01-20 DIAGNOSIS — I129 Hypertensive chronic kidney disease with stage 1 through stage 4 chronic kidney disease, or unspecified chronic kidney disease: Secondary | ICD-10-CM | POA: Diagnosis not present

## 2021-01-24 DIAGNOSIS — R002 Palpitations: Secondary | ICD-10-CM | POA: Diagnosis not present

## 2021-01-26 DIAGNOSIS — H26492 Other secondary cataract, left eye: Secondary | ICD-10-CM | POA: Diagnosis not present

## 2021-01-27 DIAGNOSIS — M545 Low back pain, unspecified: Secondary | ICD-10-CM | POA: Diagnosis not present

## 2021-01-27 DIAGNOSIS — N1831 Chronic kidney disease, stage 3a: Secondary | ICD-10-CM | POA: Diagnosis not present

## 2021-01-27 DIAGNOSIS — R251 Tremor, unspecified: Secondary | ICD-10-CM | POA: Diagnosis not present

## 2021-01-27 DIAGNOSIS — I129 Hypertensive chronic kidney disease with stage 1 through stage 4 chronic kidney disease, or unspecified chronic kidney disease: Secondary | ICD-10-CM | POA: Diagnosis not present

## 2021-02-03 ENCOUNTER — Telehealth: Payer: Self-pay | Admitting: *Deleted

## 2021-02-03 NOTE — Telephone Encounter (Addendum)
Pt wants to make sure that Dr. Harrington Challenger knows that she was only on 12.5 mg Toprol XL which has been increased 50 mg.  Medication list has been updated. She is feeling well on this dose and thinks the shaking and pounding has improved.  Her BP this week was 122/58.  She returns to Dr. Felipa Eth on 02/10/21.  Pt aware to continue current dose of Toprol XL 50 mg unless we let her know to change it.  She is interested in seeing Neurology for her hx of trigeminal neuralgia which is acting up a little bit.  I adv of the [facility].

## 2021-02-03 NOTE — Telephone Encounter (Signed)
-----   Message from Dorris Carnes V, MD sent at 01/24/2021 11:09 PM EDT ----- Sinus rhythm with few short bursts of SVT (very short, last 1-3 sec) No atrial fibrillation PT is on metoprolol   THis has recnetly been increased by [employer] Keep on same meds

## 2021-02-04 ENCOUNTER — Ambulatory Visit: Payer: Medicare Other | Admitting: Podiatry

## 2021-02-04 NOTE — Telephone Encounter (Signed)
Keep on same dose

## 2021-02-10 DIAGNOSIS — G250 Essential tremor: Secondary | ICD-10-CM | POA: Diagnosis not present

## 2021-02-10 DIAGNOSIS — I129 Hypertensive chronic kidney disease with stage 1 through stage 4 chronic kidney disease, or unspecified chronic kidney disease: Secondary | ICD-10-CM | POA: Diagnosis not present

## 2021-02-10 DIAGNOSIS — N1831 Chronic kidney disease, stage 3a: Secondary | ICD-10-CM | POA: Diagnosis not present

## 2021-02-10 DIAGNOSIS — M25561 Pain in right knee: Secondary | ICD-10-CM | POA: Diagnosis not present

## 2021-02-10 DIAGNOSIS — R269 Unspecified abnormalities of gait and mobility: Secondary | ICD-10-CM | POA: Diagnosis not present

## 2021-02-14 DIAGNOSIS — M6281 Muscle weakness (generalized): Secondary | ICD-10-CM | POA: Diagnosis not present

## 2021-02-14 DIAGNOSIS — M79604 Pain in right leg: Secondary | ICD-10-CM | POA: Diagnosis not present

## 2021-02-15 DIAGNOSIS — M6281 Muscle weakness (generalized): Secondary | ICD-10-CM | POA: Diagnosis not present

## 2021-02-15 DIAGNOSIS — M79604 Pain in right leg: Secondary | ICD-10-CM | POA: Diagnosis not present

## 2021-02-18 DIAGNOSIS — M6281 Muscle weakness (generalized): Secondary | ICD-10-CM | POA: Diagnosis not present

## 2021-02-18 DIAGNOSIS — M79604 Pain in right leg: Secondary | ICD-10-CM | POA: Diagnosis not present

## 2021-02-22 DIAGNOSIS — M6281 Muscle weakness (generalized): Secondary | ICD-10-CM | POA: Diagnosis not present

## 2021-02-22 DIAGNOSIS — M79604 Pain in right leg: Secondary | ICD-10-CM | POA: Diagnosis not present

## 2021-02-23 DIAGNOSIS — M6281 Muscle weakness (generalized): Secondary | ICD-10-CM | POA: Diagnosis not present

## 2021-02-23 DIAGNOSIS — M79604 Pain in right leg: Secondary | ICD-10-CM | POA: Diagnosis not present

## 2021-02-25 DIAGNOSIS — M6281 Muscle weakness (generalized): Secondary | ICD-10-CM | POA: Diagnosis not present

## 2021-02-25 DIAGNOSIS — M79604 Pain in right leg: Secondary | ICD-10-CM | POA: Diagnosis not present

## 2021-02-28 DIAGNOSIS — M6281 Muscle weakness (generalized): Secondary | ICD-10-CM | POA: Diagnosis not present

## 2021-02-28 DIAGNOSIS — M79604 Pain in right leg: Secondary | ICD-10-CM | POA: Diagnosis not present

## 2021-03-02 DIAGNOSIS — M79604 Pain in right leg: Secondary | ICD-10-CM | POA: Diagnosis not present

## 2021-03-02 DIAGNOSIS — M6281 Muscle weakness (generalized): Secondary | ICD-10-CM | POA: Diagnosis not present

## 2021-03-04 ENCOUNTER — Ambulatory Visit (INDEPENDENT_AMBULATORY_CARE_PROVIDER_SITE_OTHER): Payer: Medicare Other | Admitting: Podiatry

## 2021-03-04 ENCOUNTER — Other Ambulatory Visit: Payer: Self-pay

## 2021-03-04 ENCOUNTER — Encounter: Payer: Self-pay | Admitting: Podiatry

## 2021-03-04 DIAGNOSIS — L84 Corns and callosities: Secondary | ICD-10-CM | POA: Diagnosis not present

## 2021-03-04 DIAGNOSIS — E1151 Type 2 diabetes mellitus with diabetic peripheral angiopathy without gangrene: Secondary | ICD-10-CM | POA: Diagnosis not present

## 2021-03-04 DIAGNOSIS — B351 Tinea unguium: Secondary | ICD-10-CM

## 2021-03-04 NOTE — Progress Notes (Signed)
  Subjective:  Patient ID: Nancy Webster, female    DOB: October 22, 1928,  MRN: [id number]  Chief Complaint  Patient presents with  . Debridement    Trim toenails and calluses   87 y.o. female presents with the above complaint. History confirmed with patient.   Objective:  Physical Exam: warm, good capillary refill, nail exam onychomycosis of the toenails, no trophic changes or ulcerative lesions. DP pulses palpable and protective sensation intact Hammertoes bilateral foot. HPK medial hallux left, 1st met, right hallux IPJ  No images are attached to the encounter.  Assessment:   1. Onychomycosis of multiple toenails with type 2 diabetes mellitus and peripheral angiopathy ([place])    Plan:  Patient was evaluated and treated and all questions answered.  Onychomycosis, Diabetes and PAD -Patient is diabetic with a qualifying condition for at risk foot care.  Procedure: Nail Debridement Type of Debridement: manual, sharp debridement. Instrumentation: Nail nipper, rotary burr. Number of Nails: 10  Procedure: Paring of Lesion Rationale: painful hyperkeratotic lesion Type of Debridement: manual, sharp debridement. Instrumentation: 312 blade Number of Lesions: 4  Return in about 3 months (around 06/04/2021) for Diabetic Foot Care.

## 2021-03-17 ENCOUNTER — Ambulatory Visit
Admission: RE | Admit: 2021-03-17 | Discharge: 2021-03-17 | Disposition: A | Discharge status: Home / Self Care | Payer: Medicare Other | Source: Ambulatory Visit | Attending: Geriatric Medicine | Admitting: Geriatric Medicine

## 2021-03-17 ENCOUNTER — Other Ambulatory Visit: Payer: Self-pay | Admitting: Geriatric Medicine

## 2021-03-17 DIAGNOSIS — I129 Hypertensive chronic kidney disease with stage 1 through stage 4 chronic kidney disease, or unspecified chronic kidney disease: Secondary | ICD-10-CM | POA: Diagnosis not present

## 2021-03-17 DIAGNOSIS — M79604 Pain in right leg: Secondary | ICD-10-CM

## 2021-03-17 DIAGNOSIS — M7989 Other specified soft tissue disorders: Secondary | ICD-10-CM | POA: Diagnosis not present

## 2021-03-17 DIAGNOSIS — M25551 Pain in right hip: Secondary | ICD-10-CM | POA: Diagnosis not present

## 2021-03-18 ENCOUNTER — Other Ambulatory Visit: Payer: Self-pay | Admitting: Geriatric Medicine

## 2021-03-22 DIAGNOSIS — M25561 Pain in right knee: Secondary | ICD-10-CM | POA: Diagnosis not present

## 2021-03-22 DIAGNOSIS — M25551 Pain in right hip: Secondary | ICD-10-CM | POA: Diagnosis not present

## 2021-03-31 DIAGNOSIS — Z0000 Encounter for general adult medical examination without abnormal findings: Secondary | ICD-10-CM | POA: Diagnosis not present

## 2021-03-31 DIAGNOSIS — I129 Hypertensive chronic kidney disease with stage 1 through stage 4 chronic kidney disease, or unspecified chronic kidney disease: Secondary | ICD-10-CM | POA: Diagnosis not present

## 2021-03-31 DIAGNOSIS — Z1389 Encounter for screening for other disorder: Secondary | ICD-10-CM | POA: Diagnosis not present

## 2021-03-31 DIAGNOSIS — E7800 Pure hypercholesterolemia, unspecified: Secondary | ICD-10-CM | POA: Diagnosis not present

## 2021-03-31 DIAGNOSIS — N1831 Chronic kidney disease, stage 3a: Secondary | ICD-10-CM | POA: Diagnosis not present

## 2021-03-31 DIAGNOSIS — E11319 Type 2 diabetes mellitus with unspecified diabetic retinopathy without macular edema: Secondary | ICD-10-CM | POA: Diagnosis not present

## 2021-03-31 DIAGNOSIS — Z79899 Other long term (current) drug therapy: Secondary | ICD-10-CM | POA: Diagnosis not present

## 2021-03-31 DIAGNOSIS — E1121 Type 2 diabetes mellitus with diabetic nephropathy: Secondary | ICD-10-CM | POA: Diagnosis not present

## 2021-03-31 DIAGNOSIS — M79671 Pain in right foot: Secondary | ICD-10-CM | POA: Diagnosis not present

## 2021-03-31 DIAGNOSIS — I700 Atherosclerosis of aorta: Secondary | ICD-10-CM | POA: Diagnosis not present

## 2021-03-31 DIAGNOSIS — M25561 Pain in right knee: Secondary | ICD-10-CM | POA: Diagnosis not present

## 2021-05-19 DIAGNOSIS — Z79899 Other long term (current) drug therapy: Secondary | ICD-10-CM | POA: Diagnosis not present

## 2021-05-19 DIAGNOSIS — J0100 Acute maxillary sinusitis, unspecified: Secondary | ICD-10-CM | POA: Diagnosis not present

## 2021-05-19 DIAGNOSIS — J00 Acute nasopharyngitis [common cold]: Secondary | ICD-10-CM | POA: Diagnosis not present

## 2021-05-19 DIAGNOSIS — H938X3 Other specified disorders of ear, bilateral: Secondary | ICD-10-CM | POA: Diagnosis not present

## 2021-05-19 DIAGNOSIS — D649 Anemia, unspecified: Secondary | ICD-10-CM | POA: Diagnosis not present

## 2021-05-23 DIAGNOSIS — Z043 Encounter for examination and observation following other accident: Secondary | ICD-10-CM | POA: Diagnosis not present

## 2021-05-23 DIAGNOSIS — W19XXXA Unspecified fall, initial encounter: Secondary | ICD-10-CM | POA: Diagnosis not present

## 2021-05-23 DIAGNOSIS — R55 Syncope and collapse: Secondary | ICD-10-CM | POA: Diagnosis not present

## 2021-05-23 DIAGNOSIS — J0100 Acute maxillary sinusitis, unspecified: Secondary | ICD-10-CM | POA: Diagnosis not present

## 2021-05-25 DIAGNOSIS — D519 Vitamin B12 deficiency anemia, unspecified: Secondary | ICD-10-CM | POA: Diagnosis not present

## 2021-05-30 DIAGNOSIS — M25512 Pain in left shoulder: Secondary | ICD-10-CM | POA: Diagnosis not present

## 2021-05-30 DIAGNOSIS — M7061 Trochanteric bursitis, right hip: Secondary | ICD-10-CM | POA: Diagnosis not present

## 2021-05-30 DIAGNOSIS — R2681 Unsteadiness on feet: Secondary | ICD-10-CM | POA: Diagnosis not present

## 2021-06-02 DIAGNOSIS — R8290 Unspecified abnormal findings in urine: Secondary | ICD-10-CM | POA: Diagnosis not present

## 2021-06-02 DIAGNOSIS — W19XXXD Unspecified fall, subsequent encounter: Secondary | ICD-10-CM | POA: Diagnosis not present

## 2021-06-02 DIAGNOSIS — R42 Dizziness and giddiness: Secondary | ICD-10-CM | POA: Diagnosis not present

## 2021-06-09 DIAGNOSIS — R42 Dizziness and giddiness: Secondary | ICD-10-CM | POA: Diagnosis not present

## 2021-06-09 DIAGNOSIS — E1121 Type 2 diabetes mellitus with diabetic nephropathy: Secondary | ICD-10-CM | POA: Diagnosis not present

## 2021-06-09 DIAGNOSIS — I129 Hypertensive chronic kidney disease with stage 1 through stage 4 chronic kidney disease, or unspecified chronic kidney disease: Secondary | ICD-10-CM | POA: Diagnosis not present

## 2021-06-09 DIAGNOSIS — N1831 Chronic kidney disease, stage 3a: Secondary | ICD-10-CM | POA: Diagnosis not present

## 2021-06-09 DIAGNOSIS — E11319 Type 2 diabetes mellitus with unspecified diabetic retinopathy without macular edema: Secondary | ICD-10-CM | POA: Diagnosis not present

## 2021-06-10 ENCOUNTER — Ambulatory Visit (INDEPENDENT_AMBULATORY_CARE_PROVIDER_SITE_OTHER): Payer: Medicare Other | Admitting: Podiatry

## 2021-06-10 ENCOUNTER — Other Ambulatory Visit: Payer: Self-pay

## 2021-06-10 DIAGNOSIS — E1151 Type 2 diabetes mellitus with diabetic peripheral angiopathy without gangrene: Secondary | ICD-10-CM

## 2021-06-10 DIAGNOSIS — L84 Corns and callosities: Secondary | ICD-10-CM | POA: Diagnosis not present

## 2021-06-10 DIAGNOSIS — B351 Tinea unguium: Secondary | ICD-10-CM | POA: Diagnosis not present

## 2021-06-10 NOTE — Progress Notes (Signed)
  Subjective:  Patient ID: Nancy Webster, female    DOB: 09-Nov-1927,  MRN: [id number]  Chief Complaint  Patient presents with   Nail Problem    Nail trim 1-5 bilateral   87 y.o. female presents with the above complaint. History confirmed with patient.   Objective:  Physical Exam: warm, good capillary refill, nail exam onychomycosis of the toenails, no trophic changes or ulcerative lesions. DP pulses palpable and protective sensation intact Hammertoes bilateral foot. HPK medial hallux left, 1st met, right hallux IPJ  No images are attached to the encounter.  Assessment:   1. Diabetes mellitus type 2 with peripheral artery disease ([place])   2. Onychomycosis   3. Callus     Plan:  Patient was evaluated and treated and all questions answered.  Onychomycosis, Diabetes and PAD -Patient is diabetic with a qualifying condition for at risk foot care.  Procedure: Nail Debridement Type of Debridement: manual, sharp debridement. Instrumentation: Nail nipper, rotary burr. Number of Nails: 1   Procedure: Paring of Lesion Rationale: painful hyperkeratotic lesion Type of Debridement: manual, sharp debridement. Instrumentation: 312 blade Number of Lesions: 2   Return in about 3 months (around 09/10/2021) for Diabetic Foot Care.

## 2021-06-24 ENCOUNTER — Other Ambulatory Visit: Payer: Self-pay

## 2021-06-24 ENCOUNTER — Ambulatory Visit (INDEPENDENT_AMBULATORY_CARE_PROVIDER_SITE_OTHER): Payer: Medicare Other | Admitting: Internal Medicine

## 2021-06-24 ENCOUNTER — Encounter: Payer: Self-pay | Admitting: Internal Medicine

## 2021-06-24 VITALS — BP 120/56 | HR 75 | Ht 63.0 in | Wt 152.6 lb

## 2021-06-24 DIAGNOSIS — R002 Palpitations: Secondary | ICD-10-CM | POA: Diagnosis not present

## 2021-06-24 DIAGNOSIS — R42 Dizziness and giddiness: Secondary | ICD-10-CM | POA: Diagnosis not present

## 2021-06-24 NOTE — Patient Instructions (Signed)
Medication Instructions:    STOP TAKING LASIX   STOP TAKING IMDUR    *If you need a refill on your cardiac medications before your next appointment, please call your pharmacy*   Lab Work:  CORTISOL LEVEL CBC AND BMET  TODAY   If you have labs (blood work) drawn today and your tests are completely normal, you will receive your results only by: [place] (if you have MyChart) OR A paper copy in the mail If you have any lab test that is abnormal or we need to change your treatment, we will call you to review the results.   Testing/Procedures: NONE ORDERED  TODAY     Follow-Up: At [facility], you and your health needs are our priority.  As part of our continuing mission to provide you with exceptional heart care, we have created designated Provider Care Teams.  These Care Teams include your primary Cardiologist (physician) and Advanced Practice Providers (APPs -  Physician Assistants and Nurse Practitioners) who all work together to provide you with the care you need, when you need it.  We recommend signing up for the patient portal called "MyChart".  Sign up information is provided on this After Visit Summary.  MyChart is used to connect with patients for Virtual Visits (Telemedicine).  Patients are able to view lab/test results, encounter notes, upcoming appointments, etc.  Non-urgent messages can be sent to your provider as well.   To learn more about what you can do with MyChart, go to [email or web address].    Your next appointment:   2 month(s)  The format for your next appointment:   In Person  Provider:   Dorris Carnes, MD  ( PER DR ROSS YOU WILL BE CONTACT BACK FOR APPOINTMENT TIME AND DAY DUE TO SCHEDULE CONFLICT)   Other Instructions

## 2021-06-24 NOTE — Progress Notes (Signed)
Cardiology Office Note   Date:  06/24/2021   ID:  Nancy Webster, DOB 10-31-1927, MRN [id number]  PCP:  Lajean Manes, MD  Cardiologist:   Dorris Carnes, MD   Patient presents for follow-up of dizziness    History of Present Illness: Nancy Webster is a 86 y.o. female with a history of SVT, DM (she says dx around age 70 yo), HTN, HL, GERD, CKD, trigeminal neuralgia (s/p gamma knife). I last saw the pt in clinic in March 2022   Ath that visit she complained of dizziness/unsteadiness.   At that visit orthostatics were positive  I reocmm a drop in her hypertensive meds     Since seen she has also been seen and treated for vertigo  The pt continues to feel unsteady   She lives in an apt at [facility] she is afraid to walk around building for fear of falling     BP is up and doewn  Pt fell recently    Denies syncope   No palpitations   Current Meds  Medication Sig   acetaminophen (TYLENOL) 500 MG tablet Take 500 mg by mouth every 8 (eight) hours as needed for mild pain or headache.   benazepril (LOTENSIN) 10 MG tablet Take 20 mg by mouth daily.    Biotin 5000 MCG TABS Take 1 tablet by mouth daily.   Blood Glucose Monitoring Suppl (FREESTYLE LITE) DEVI Dx: E11.9   Cholecalciferol (VITAMIN D3) 1.25 MG (50000 UT) TABS Take 1 tablet by mouth daily.   fluticasone (FLONASE) 50 MCG/ACT nasal spray Place 1 spray into both nostrils daily.   glucose blood (FREESTYLE LITE) test strip Dx: E11.9   ibuprofen (ADVIL) 200 MG tablet Take 200 mg by mouth as needed.   isosorbide mononitrate (IMDUR) 30 MG 24 hr tablet Take 30 mg by mouth daily.   MEGARED OMEGA-3 KRILL OIL PO Take 1 tablet by mouth daily.   metFORMIN (GLUCOPHAGE) 1000 MG tablet Take 500 mg by mouth 2 (two) times daily.   metFORMIN (GLUCOPHAGE) 500 MG tablet Take 500 mg by mouth 2 (two) times daily.   metoprolol succinate (TOPROL XL) 25 MG 24 hr tablet Take 0.5 tablets (12.5 mg total) by mouth daily. (Patient taking differently: Take 50  mg by mouth daily.)   Multiple Vitamins-Minerals (CENTRUM SILVER ADULT 50+) TABS Take 1 tablet by mouth daily.   pantoprazole (PROTONIX) 40 MG tablet Take 40 mg by mouth daily.   Polyethyl Glycol-Propyl Glycol (SYSTANE) 0.4-0.3 % SOLN Place 1 drop into both eyes 2 (two) times daily.   sodium chloride (OCEAN) 0.65 % SOLN nasal spray Place 1 spray into both nostrils as needed for congestion.     Allergies:   Atorvastatin, Other, and Statins   Past Medical History:  Diagnosis Date   Anxiety    Coronary artery disease    Diabetes mellitus without complication ([place])    GERD (gastroesophageal reflux disease)    Hypertension    Renal disorder    stage 3    Past Surgical History:  Procedure Laterality Date   CHOLECYSTECTOMY N/A 03/02/2018   Procedure: LAPAROSCOPIC CHOLECYSTECTOMY;  Surgeon: Greer Pickerel, MD;  Location: [facility];  Service: General;  Laterality: N/A;   ERCP N/A 06/26/2019   Procedure: ENDOSCOPIC RETROGRADE CHOLANGIOPANCREATOGRAPHY (ERCP);  Surgeon: [name], MD;  Location: [facility];  Service: Gastroenterology;  Laterality: N/A;   SPHINCTEROTOMY  06/26/2019   Procedure: SPHINCTEROTOMY;  Surgeon: [name], MD;  Location: [facility];  Service:  Gastroenterology;;     Social History:  The patient  reports that she has never smoked. She has never used smokeless tobacco. She reports that she does not currently use alcohol. She reports that she does not use drugs.   Family History:  The patient's family history includes Heart failure in her mother.    ROS:  Please see the history of present illness. All other systems are reviewed and  Negative to the above problem except as noted.    PHYSICAL EXAM: VS:  BP (!) 120/56   Pulse 75   Ht '5\' 3"'$  (1.6 m)   Wt 152 lb 9.6 oz (69.2 kg)   SpO2 95%   BMI 27.03 kg/m    BP laying 136/76  P 65   Sitting 120/64  P 64  Standing  102/54 P 75   Standing 4 min 106/60  P 73    Pt feeling wobbly and shaking with standing   GEN: Well  nourished, well developed, in no acute distress  HEENT: normal  Neck: no JVD, carotid bruits, Cardiac: RRR; no murmurs, ,no LE edema  Respiratory:  clear to auscultation bilaterally GI: soft, nontender, nondistended, + BS  No hepatomegaly  MS: no deformity Moving all extremities   Skin: warm and dry, no rash Neuro:  Strength and sensation are intact Psych: euthymic mood, full affect   EKG:  EKG is not  ordered today.    Lipid Panel    Component Value Date/Time   CHOL 193 06/24/2019 2207   TRIG 130 06/24/2019 2207   HDL 48 06/24/2019 2207   CHOLHDL 4.0 06/24/2019 2207   VLDL 26 06/24/2019 2207   LDLCALC 119 (H) 06/24/2019 2207      Wt Readings from Last 3 Encounters:  06/24/21 152 lb 9.6 oz (69.2 kg)  12/29/20 153 lb 6.4 oz (69.6 kg)  12/17/20 151 lb 3.2 oz (68.6 kg)      ASSESSMENT AND PLAN:  1 Dizziness   Pt with drop in BP with standing   Would recomm stopping lasix and imdur   Continue benazepril   Will need to follow BP   Check BMET and CBC and Cortisol  2  Hx SVT  No symptoms to sugg recurrence    Keep on Toprol   3  HTN   Follow as noted above     4  HL  Not tolerant to statins      Current medicines are reviewed at length with the patient today.  The patient does not have concerns regarding medicines.  Signed, Dorris Carnes, MD  06/24/2021 9:45 AM    [facility] [place], [place], [place]  [address] Phone: [phone]; Fax: [phone]

## 2021-06-25 LAB — BASIC METABOLIC PANEL
BUN/Creatinine Ratio: 20 (ref 12–28)
BUN: 21 mg/dL (ref 10–36)
CO2: 25 mmol/L (ref 20–29)
Calcium: 9.7 mg/dL (ref 8.7–10.3)
Chloride: 97 mmol/L (ref 96–106)
Creatinine, Ser: 1.05 mg/dL — ABNORMAL HIGH (ref 0.57–1.00)
Glucose: 203 mg/dL — ABNORMAL HIGH (ref 65–99)
Potassium: 5 mmol/L (ref 3.5–5.2)
Sodium: 137 mmol/L (ref 134–144)
eGFR: 50 mL/min/{1.73_m2} — ABNORMAL LOW (ref >59)

## 2021-06-25 LAB — CBC
Hematocrit: 39.9 % (ref 34.0–46.6)
Hemoglobin: 13 g/dL (ref 11.1–15.9)
MCH: 31.2 pg (ref 26.6–33.0)
MCHC: 32.6 g/dL (ref 31.5–35.7)
MCV: 96 fL (ref 79–97)
Platelets: 315 10*3/uL (ref 150–450)
RBC: 4.17 x10E6/uL (ref 3.77–5.28)
RDW: 12.4 % (ref 11.7–15.4)
WBC: 8.7 10*3/uL (ref 3.4–10.8)

## 2021-06-25 LAB — CORTISOL: Cortisol: 10.9 ug/dL

## 2021-06-30 ENCOUNTER — Other Ambulatory Visit: Payer: Self-pay | Admitting: *Deleted

## 2021-06-30 MED ORDER — FUROSEMIDE 20 MG PO TABS: 10.0000 mg | ORAL_TABLET | ORAL | 3 refills | Status: DC

## 2021-06-30 NOTE — Progress Notes (Signed)
Restarting lasix 10 mg every other day.

## 2021-07-01 ENCOUNTER — Telehealth: Payer: Self-pay | Admitting: *Deleted

## 2021-07-01 MED ORDER — FUROSEMIDE 20 MG PO TABS: 10.0000 mg | ORAL_TABLET | Freq: Every day | ORAL | 3 refills | Status: DC

## 2021-07-01 NOTE — Telephone Encounter (Signed)
-----   Message from Fay Records, MD sent at 06/30/2021 10:11 PM EDT ----- Reviewed phone note Stop benazepril Take 1 lasix x 1 only then 1/2 lasix daily    Keep following BP

## 2021-07-01 NOTE — Telephone Encounter (Signed)
See lab results 06/24/21 for further details.  Pt voices understanding to stop benazepril and take lasix 20 mg daily x1 day and then will take lasix 10 mg daily and monitor BP.  Requests an ENT to be referred to because her ears give her a problem and she has history of sinus surgery 15 years ago and it left a little abscess that has never healed.   "As long as it's not Dr. Wilburn Cornelia". I adv to ask Dr. Felipa Eth for a new referral.

## 2021-08-18 DIAGNOSIS — Z23 Encounter for immunization: Secondary | ICD-10-CM | POA: Diagnosis not present

## 2021-08-23 DIAGNOSIS — H52203 Unspecified astigmatism, bilateral: Secondary | ICD-10-CM | POA: Diagnosis not present

## 2021-08-23 DIAGNOSIS — H35373 Puckering of macula, bilateral: Secondary | ICD-10-CM | POA: Diagnosis not present

## 2021-08-23 DIAGNOSIS — E119 Type 2 diabetes mellitus without complications: Secondary | ICD-10-CM | POA: Diagnosis not present

## 2021-08-23 DIAGNOSIS — H02054 Trichiasis without entropian left upper eyelid: Secondary | ICD-10-CM | POA: Diagnosis not present

## 2021-08-26 NOTE — Progress Notes (Signed)
[facility] Note  08/30/2021     CHIEF COMPLAINT Patient presents for Retina Evaluation   HISTORY OF PRESENT ILLNESS: Nancy Webster is a 87 y.o. female who presents to the clinic today for:   HPI     Retina Evaluation   In both eyes.  This started 12 months ago.  Duration of 12 months.  Context:  distance vision, mid-range vision and near vision.  Treatments tried include no treatments.  I, the attending physician,  performed the HPI with the patient and updated documentation appropriately.        Comments   Patient states referred by Dr. Ellie Webster for retinal evaluation for possible ERM. Patient states vision gradually worse for the past year. Difficulty with reading, needs magnifier for small print. Patient reports floaters at times OU for at least a year. Some flashes, but not often. Patient is diabetic for the past 12-13 years. BS was 167 this am. Last a1c was around 7, checked around June 2022.       Last edited by Nancy Caffey, MD on 08/31/2021  8:17 AM.    Pt is here on the referral of Dr. Ellie Webster for concern of ERM OU, pt states she had cataract sx with Dr.  Vilinda Webster in [place], pt states she has also seen several drs at [facility] in [place], the last dr she saw there was Dr. Keith Nancy Webster, pt states she has been treated for dry eyes  Referring physician: Luberta Mutter, MD [address],  [place] [id number]  HISTORICAL INFORMATION:   Selected notes from the MEDICAL RECORD NUMBER Referred by Dr. Ellie Webster for eval of ERM OU   CURRENT MEDICATIONS: Current Outpatient Medications (Ophthalmic Drugs)  Medication Sig   Bromfenac Sodium (PROLENSA) 0.07 % SOLN Place 1 drop into the left eye 4 (four) times daily.   Polyethyl Glycol-Propyl Glycol (SYSTANE) 0.4-0.3 % SOLN Place 1 drop into both eyes 2 (two) times daily.   prednisoLONE acetate (PRED FORTE) 1 % ophthalmic suspension Place 1 drop into the left eye 4 (four) times daily.   No current  facility-administered medications for this visit. (Ophthalmic Drugs)   Current Outpatient Medications (Other)  Medication Sig   acetaminophen (TYLENOL) 500 MG tablet Take 500 mg by mouth every 8 (eight) hours as needed for mild pain or headache.   Biotin 5000 MCG TABS Take 1 tablet by mouth daily.   Blood Glucose Monitoring Suppl (FREESTYLE LITE) DEVI Dx: E11.9   Cholecalciferol (VITAMIN D3) 1.25 MG (50000 UT) TABS Take 1 tablet by mouth daily.   fluticasone (FLONASE) 50 MCG/ACT nasal spray Place 1 spray into both nostrils daily.   glucose blood (FREESTYLE LITE) test strip Dx: E11.9   ibuprofen (ADVIL) 200 MG tablet Take 200 mg by mouth as needed.   MEGARED OMEGA-3 KRILL OIL PO Take 1 tablet by mouth daily.   metFORMIN (GLUCOPHAGE) 500 MG tablet Take 500 mg by mouth 2 (two) times daily.   metoprolol succinate (TOPROL XL) 25 MG 24 hr tablet Take 0.5 tablets (12.5 mg total) by mouth daily. (Patient taking differently: Take 50 mg by mouth daily.)   Multiple Vitamins-Minerals (CENTRUM SILVER ADULT 50+) TABS Take 1 tablet by mouth daily.   pantoprazole (PROTONIX) 40 MG tablet Take 40 mg by mouth daily.   sodium chloride (OCEAN) 0.65 % SOLN nasal spray Place 1 spray into both nostrils as needed for congestion.   ALPRAZolam (XANAX) 0.5 MG tablet Take 1-2 tablets by mouth as  needed for anxiety (Patient not taking: No sig reported)   amoxicillin-clavulanate (AUGMENTIN) 500-125 MG tablet Take 1 tablet by mouth 2 (two) times daily. (Patient not taking: No sig reported)   benazepril (LOTENSIN) 20 MG tablet Take 20 mg by mouth daily. (Patient not taking: No sig reported)   diclofenac Sodium (VOLTAREN) 1 % GEL Apply topically. (Patient not taking: No sig reported)   furosemide (LASIX) 20 MG tablet Take 0.5 tablets (10 mg total) by mouth daily.   gabapentin (NEURONTIN) 100 MG capsule Take 100 mg by mouth at bedtime. (Patient not taking: No sig reported)   metFORMIN (GLUCOPHAGE) 1000 MG tablet Take 500 mg by  mouth 2 (two) times daily.   triamcinolone acetonide (KENALOG-40) 40 MG/ML injection Inject 2 mLs into the muscle once. (Patient not taking: No sig reported)   No current facility-administered medications for this visit. (Other)   REVIEW OF SYSTEMS: ROS   Positive for: Endocrine, Cardiovascular, Eyes Negative for: Constitutional, Gastrointestinal, Neurological, Skin, Genitourinary, Musculoskeletal, HENT, Respiratory, Psychiatric, Allergic/Imm, Heme/Lymph Last edited by Roselee Nova D, COT on 08/30/2021  1:35 PM.     ALLERGIES Allergies  Allergen Reactions   Atorvastatin Other (See Comments)    UNKNOWN   Other Other (See Comments)    Cramps to all Cholesterol medications   Statins Other (See Comments)    "cramps" UNKNOWN   PAST MEDICAL HISTORY Past Medical History:  Diagnosis Date   Anxiety    Coronary artery disease    Diabetes mellitus without complication ([place])    GERD (gastroesophageal reflux disease)    Hypertension    Renal disorder    stage 3   Past Surgical History:  Procedure Laterality Date   CATARACT EXTRACTION Bilateral    [place]   CHOLECYSTECTOMY N/A 03/02/2018   Procedure: LAPAROSCOPIC CHOLECYSTECTOMY;  Surgeon: Greer Pickerel, MD;  Location: [place];  Service: General;  Laterality: N/A;   ERCP N/A 06/26/2019   Procedure: ENDOSCOPIC RETROGRADE CHOLANGIOPANCREATOGRAPHY (ERCP);  Surgeon: [name], MD;  Location: [place];  Service: Gastroenterology;  Laterality: N/A;   SPHINCTEROTOMY  06/26/2019   Procedure: SPHINCTEROTOMY;  Surgeon: [name], MD;  Location: [facility] ENDOSCOPY;  Service: Gastroenterology;;    FAMILY HISTORY Family History  Problem Relation Age of Onset   Heart failure Mother    Diabetes Daughter     SOCIAL HISTORY Social History   Tobacco Use   Smoking status: Never   Smokeless tobacco: Never  Vaping Use   Vaping Use: Never used  Substance Use Topics   Alcohol use: Not Currently   Drug use: Never       OPHTHALMIC  EXAM: Base Eye Exam     Visual Acuity (Snellen - Linear)       Right Left   Dist cc 20/40 +1 20/60 +2   Dist ph cc 20/30 -1 20/50 -2    Correction: Glasses         Tonometry (Tonopen, 1:47 PM)       Right Left   Pressure 15 17         Pupils       Dark Light Shape React APD   Right 2 1 Round Slow None   Left 2 1 Round Slow None         Visual Fields (Counting fingers)       Left Right    Full Full         Extraocular Movement       Right Left    Full,  Ortho Full, Ortho         Neuro/Psych     Oriented x3: Yes   Mood/Affect: Normal         Dilation     Both eyes: 1.0% Mydriacyl, 2.5% Phenylephrine @ 1:47 PM           Slit Lamp and Fundus Exam     Slit Lamp Exam       Right Left   Lids/Lashes Dermatochalasis - upper lid, mild MGD Dermatochalasis - upper lid, mild MGD   Conjunctiva/Sclera White and quiet White and quiet   Cornea trace PEE, well healed cataract wound trace PEE, well healed cataract wound   Anterior Chamber Deep and quiet deep and clear, no cell or flare   Iris Round and dilated Round and dilated, focal atrophy and TID at 0300   Lens PC IOL in good position with open PC PC IOL in good position with open PC   Vitreous Vitreous syneresis Vitreous syneresis, Posterior vitreous detachment         Fundus Exam       Right Left   Disc mild Pallor, Sharp rim mild Pallor, mild temporal PPA   C/D Ratio 0.5 0.6   Macula Flat, Blunted foveal reflex, mild ERM, RPE mottling, No heme or edema Flat, Blunted foveal reflex, ERM with +cystic changes, no heme   Vessels attenuated, Tortuous attenuated, Tortuous   Periphery Attached, blonde fundus Attached, blonde fundus           Refraction     Wearing Rx       Sphere Cylinder Axis   Right +0.75 Sphere    Left -1.00 +1.00 022         Manifest Refraction       Sphere Cylinder Axis Dist VA   Right -0.75 +0.75 175 20/30+2   Left -1.00 +0.75 010 20/60+1             IMAGING AND PROCEDURES  Imaging and Procedures for 08/30/2021  OCT, Retina - OU - Both Eyes       Right Eye Quality was good. Central Foveal Thickness: 385. Progression has no prior data. Findings include epiretinal membrane, no IRF, no SRF, vitreomacular adhesion , abnormal foveal contour, retinal drusen (Blunted foveal contour).   Left Eye Quality was good. Central Foveal Thickness: 434. Progression has no prior data. Findings include abnormal foveal contour, intraretinal fluid, epiretinal membrane, vitreomacular adhesion , no SRF, retinal drusen .   Notes *Images captured and stored on drive  Diagnosis / Impression:  ERM OU  OS: +IRF / cystic changes -- ?CME component  Clinical management:  See below  Abbreviations: NFP - Normal foveal profile. CME - cystoid macular edema. PED - pigment epithelial detachment. IRF - intraretinal fluid. SRF - subretinal fluid. EZ - ellipsoid zone. ERM - epiretinal membrane. ORA - outer retinal atrophy. ORT - outer retinal tubulation. SRHM - subretinal hyper-reflective material. IRHM - intraretinal hyper-reflective material      Fluorescein Angiography Optos (Transit OS)       Right Eye Progression has no prior data. Early phase findings include normal observations. Mid/Late phase findings include staining (Mild, late perifoveal staining).   Left Eye Progression has no prior data. Early phase findings include normal observations. Mid/Late phase findings include leakage, staining (Mild central staining and leakage).   Notes **Images stored on drive**  Impression: Faint fluorescein signal OU Mild perifoveal staining / leakage OS -- mild CME  ASSESSMENT/PLAN:    ICD-10-CM   1. Epiretinal membrane (ERM) of both eyes  H35.373     2. Retinal edema  H35.81 OCT, Retina - OU - Both Eyes    3. Essential hypertension  I10     4. Hypertensive retinopathy of both eyes  H35.033 Fluorescein Angiography Optos (Transit OS)     5. Pseudophakia, both eyes  Z96.1       1,2. Epiretinal membrane, both eyes   - OS with cystic changes / CME - The natural history, anatomy, potential for loss of vision, and treatment options including vitrectomy techniques and the complications of endophthalmitis, retinal detachment, vitreous hemorrhage, cataract progression and permanent vision loss discussed with the patient. - +ERM w/ blunting of foveal contour -- OS with +IRF / cystic changes - BCVA OD: 20/30, OS: 20/50 - FA 11.1.22 shows mild perifoveal staining / leakage OS suggestive of CME component - recommend initiating treatment of CME component w/ PF and Prolensa QID OS  - will hold off on surgery until CME is addressed - f/u 3-4 weeks -- DFE/OCT  3,4. Hypertensive retinopathy OU - discussed importance of tight BP control - monitor  5. Pseudophakia OU  - s/p CE/IOL (Dr. Vilinda Webster)  - IOLs in good position, doing well  - monitor    Ophthalmic Meds Ordered this visit:  Meds ordered this encounter  Medications   prednisoLONE acetate (PRED FORTE) 1 % ophthalmic suspension    Sig: Place 1 drop into the left eye 4 (four) times daily.    Dispense:  15 mL    Refill:  0   Bromfenac Sodium (PROLENSA) 0.07 % SOLN    Sig: Place 1 drop into the left eye 4 (four) times daily.    Dispense:  3 mL    Refill:  3     Return for f/u 3-4 weeks, ERM OU, DFE, OCT.  There are no Patient Instructions on file for this visit.   Explained the diagnoses, plan, and follow up with the patient and they expressed understanding.  Patient expressed understanding of the importance of proper follow up care.   This document serves as a record of services personally performed by Gardiner Sleeper, MD, PhD. It was created on their behalf by Estill Bakes, COT an ophthalmic technician. The creation of this record is the provider's dictation and/or activities during the visit.    Electronically signed by: Estill Bakes, COT 10.28.22 @ 8:23 AM    This document serves as a record of services personally performed by Gardiner Sleeper, MD, PhD. It was created on their behalf by San Jetty. Owens Shark, OA an ophthalmic technician. The creation of this record is the provider's dictation and/or activities during the visit.    Electronically signed by: San Jetty. Owens Shark, [place] 11.01.2022 8:23 AM  Gardiner Sleeper, M.D., Ph.D. Diseases & Surgery of the Retina and Vitreous [facility]  I have reviewed the above documentation for accuracy and completeness, and I agree with the above. Gardiner Sleeper, M.D., Ph.D. 08/31/21 8:23 AM   Abbreviations: M myopia (nearsighted); A astigmatism; H hyperopia (farsighted); P presbyopia; Mrx spectacle prescription;  CTL contact lenses; OD right eye; OS left eye; OU both eyes  XT exotropia; ET esotropia; PEK punctate epithelial keratitis; PEE punctate epithelial erosions; DES dry eye syndrome; MGD meibomian gland dysfunction; ATs artificial tears; PFAT's preservative free artificial tears; [place] nuclear sclerotic cataract; PSC posterior subcapsular cataract; ERM epi-retinal membrane; PVD posterior vitreous detachment; RD retinal  detachment; DM diabetes mellitus; DR diabetic retinopathy; NPDR non-proliferative diabetic retinopathy; PDR proliferative diabetic retinopathy; CSME clinically significant macular edema; DME diabetic macular edema; dbh dot blot hemorrhages; CWS cotton wool spot; POAG primary open angle glaucoma; C/D cup-to-disc ratio; HVF humphrey visual field; GVF goldmann visual field; OCT optical coherence tomography; IOP intraocular pressure; BRVO Branch retinal vein occlusion; CRVO central retinal vein occlusion; CRAO central retinal artery occlusion; BRAO branch retinal artery occlusion; RT retinal tear; SB scleral buckle; PPV pars plana vitrectomy; VH Vitreous hemorrhage; PRP panretinal laser photocoagulation; IVK intravitreal kenalog; VMT vitreomacular traction; MH Macular hole;  NVD  neovascularization of the disc; NVE neovascularization elsewhere; AREDS age related eye disease study; ARMD age related macular degeneration; POAG primary open angle glaucoma; EBMD epithelial/anterior basement membrane dystrophy; ACIOL anterior chamber intraocular lens; IOL intraocular lens; PCIOL posterior chamber intraocular lens; Phaco/IOL phacoemulsification with intraocular lens placement; [place] photorefractive keratectomy; LASIK laser assisted in situ keratomileusis; HTN hypertension; DM diabetes mellitus; COPD chronic obstructive pulmonary disease

## 2021-08-30 ENCOUNTER — Other Ambulatory Visit: Payer: Self-pay

## 2021-08-30 ENCOUNTER — Ambulatory Visit (INDEPENDENT_AMBULATORY_CARE_PROVIDER_SITE_OTHER): Payer: Medicare Other | Admitting: Ophthalmology

## 2021-08-30 ENCOUNTER — Encounter (INDEPENDENT_AMBULATORY_CARE_PROVIDER_SITE_OTHER): Payer: Self-pay | Admitting: Ophthalmology

## 2021-08-30 DIAGNOSIS — H35373 Puckering of macula, bilateral: Secondary | ICD-10-CM

## 2021-08-30 DIAGNOSIS — H3581 Retinal edema: Secondary | ICD-10-CM

## 2021-08-30 DIAGNOSIS — I10 Essential (primary) hypertension: Secondary | ICD-10-CM

## 2021-08-30 DIAGNOSIS — Z961 Presence of intraocular lens: Secondary | ICD-10-CM

## 2021-08-30 DIAGNOSIS — H35033 Hypertensive retinopathy, bilateral: Secondary | ICD-10-CM

## 2021-08-30 MED ORDER — PROLENSA 0.07 % OP SOLN: 1.0000 [drp] | Freq: Four times a day (QID) | OPHTHALMIC | 3 refills | Status: DC

## 2021-08-30 MED ORDER — PREDNISOLONE ACETATE 1 % OP SUSP: 1.0000 [drp] | Freq: Four times a day (QID) | OPHTHALMIC | 0 refills | Status: DC

## 2021-08-31 ENCOUNTER — Encounter (INDEPENDENT_AMBULATORY_CARE_PROVIDER_SITE_OTHER): Payer: Medicare Other | Admitting: Ophthalmology

## 2021-08-31 ENCOUNTER — Encounter (INDEPENDENT_AMBULATORY_CARE_PROVIDER_SITE_OTHER): Payer: Self-pay | Admitting: Ophthalmology

## 2021-09-02 ENCOUNTER — Telehealth (INDEPENDENT_AMBULATORY_CARE_PROVIDER_SITE_OTHER): Payer: Self-pay

## 2021-09-02 NOTE — Telephone Encounter (Signed)
Pt LVM to confirm drops prescribed at visit on 11.1.22 were just for OS because she thought she recalled Dr. Coralyn Pear stating for both eyes. Per Dr. Coralyn Pear, Prolensa and PF gtts are for OS only. Pt also inquired about gtts causing hoarseness. Per Dr. Coralyn Pear, gtts prescribed are not known for that side effect but pt is to monitor and call back if concerns continue. Pt was informed. MS

## 2021-09-06 ENCOUNTER — Telehealth: Payer: Self-pay | Admitting: Internal Medicine

## 2021-09-06 NOTE — Telephone Encounter (Signed)
OK If surgery planned I think patient is an acceptable candidate for eye surgery   Low risk for major cardiac complication

## 2021-09-06 NOTE — Telephone Encounter (Signed)
Spoke with pt who states she has seen a Retina specialist.  She is using new eye drops at present to see if this helps improve her vision.  She will return to retina specialist on 09/20/2021 for further evaluation.  There is a possibility she will need surgery and wanted to make Dr Harrington Challenger aware.  Pt advised if she needs surgery provider will contact our office re: cardiac clearance if needed. But will forward information to Dr Harrington Challenger and RN.  Pt verbalizes understanding and thanked Therapist, sports for the call.

## 2021-09-06 NOTE — Telephone Encounter (Signed)
   Pt is requesting to speak with Michalene, she said its about her upcoming eye procedure, she doesn't know if she will get it but she wanted to discuss it with Dr. Alan Ripper nurse

## 2021-09-07 NOTE — Telephone Encounter (Signed)
Pt advised and will fax this note to Dr. Coralyn Pear her Opthamologist.

## 2021-09-16 ENCOUNTER — Ambulatory Visit: Payer: Medicare Other | Admitting: Podiatry

## 2021-09-16 NOTE — Progress Notes (Signed)
[facility] Note  09/20/2021     CHIEF COMPLAINT Patient presents for Retina Follow Up   HISTORY OF PRESENT ILLNESS: Nancy Webster is a 87 y.o. female who presents to the clinic today for:   HPI     Retina Follow Up   Patient presents with  Other.  In both eyes.  Duration of 3 weeks.  Since onset it is gradually worsening.  I, the attending physician,  performed the HPI with the patient and updated documentation appropriately.        Comments   Pt states vision is "not real great", she also states her left eye seems to stay swollen, pt has not noticed as many floaters since she was here last, using PF and Prolensa QID OS, pts BG was 180 this morning when she woke up      Last edited by Bernarda Caffey, MD on 09/20/2021  1:45 PM.     Referring physician: Luberta Mutter, MD [place],  [place] [address]  HISTORICAL INFORMATION:  Selected notes from the MEDICAL RECORD NUMBER Referred by Dr. Ellie Lunch for eval of ERM OU   CURRENT MEDICATIONS: Current Outpatient Medications (Ophthalmic Drugs)  Medication Sig   Bromfenac Sodium (PROLENSA) 0.07 % SOLN Place 1 drop into the left eye 4 (four) times daily.   Polyethyl Glycol-Propyl Glycol (SYSTANE) 0.4-0.3 % SOLN Place 1 drop into both eyes 2 (two) times daily.   prednisoLONE acetate (PRED FORTE) 1 % ophthalmic suspension Place 1 drop into the left eye 4 (four) times daily.   No current facility-administered medications for this visit. (Ophthalmic Drugs)   Current Outpatient Medications (Other)  Medication Sig   acetaminophen (TYLENOL) 500 MG tablet Take 500 mg by mouth every 8 (eight) hours as needed for mild pain or headache.   ALPRAZolam (XANAX) 0.5 MG tablet Take 1-2 tablets by mouth as needed for anxiety (Patient not taking: No sig reported)   amoxicillin-clavulanate (AUGMENTIN) 500-125 MG tablet Take 1 tablet by mouth 2 (two) times daily. (Patient not taking: No sig reported)    benazepril (LOTENSIN) 20 MG tablet Take 20 mg by mouth daily. (Patient not taking: No sig reported)   Biotin 5000 MCG TABS Take 1 tablet by mouth daily.   Blood Glucose Monitoring Suppl (FREESTYLE LITE) DEVI Dx: E11.9   Cholecalciferol (VITAMIN D3) 1.25 MG (50000 UT) TABS Take 1 tablet by mouth daily.   diclofenac Sodium (VOLTAREN) 1 % GEL Apply topically. (Patient not taking: No sig reported)   fluticasone (FLONASE) 50 MCG/ACT nasal spray Place 1 spray into both nostrils daily.   furosemide (LASIX) 20 MG tablet Take 0.5 tablets (10 mg total) by mouth daily.   gabapentin (NEURONTIN) 100 MG capsule Take 100 mg by mouth at bedtime. (Patient not taking: No sig reported)   glucose blood (FREESTYLE LITE) test strip Dx: E11.9   ibuprofen (ADVIL) 200 MG tablet Take 200 mg by mouth as needed.   MEGARED OMEGA-3 KRILL OIL PO Take 1 tablet by mouth daily.   metFORMIN (GLUCOPHAGE) 1000 MG tablet Take 500 mg by mouth 2 (two) times daily.   metFORMIN (GLUCOPHAGE) 500 MG tablet Take 500 mg by mouth 2 (two) times daily.   metoprolol succinate (TOPROL XL) 25 MG 24 hr tablet Take 0.5 tablets (12.5 mg total) by mouth daily. (Patient taking differently: Take 50 mg by mouth daily.)   Multiple Vitamins-Minerals (CENTRUM SILVER ADULT 50+) TABS Take 1 tablet by mouth daily.  pantoprazole (PROTONIX) 40 MG tablet Take 40 mg by mouth daily.   sodium chloride (OCEAN) 0.65 % SOLN nasal spray Place 1 spray into both nostrils as needed for congestion.   triamcinolone acetonide (KENALOG-40) 40 MG/ML injection Inject 2 mLs into the muscle once. (Patient not taking: No sig reported)   No current facility-administered medications for this visit. (Other)   REVIEW OF SYSTEMS: ROS   Positive for: Endocrine, Cardiovascular, Eyes Negative for: Constitutional, Gastrointestinal, Neurological, Skin, Genitourinary, Musculoskeletal, HENT, Respiratory, Psychiatric, Allergic/Imm, Heme/Lymph Last edited by Debbrah Alar, COT on  09/20/2021  1:09 PM.      ALLERGIES Allergies  Allergen Reactions   Atorvastatin Other (See Comments)    UNKNOWN   Other Other (See Comments)    Cramps to all Cholesterol medications   Statins Other (See Comments)    "cramps" UNKNOWN   PAST MEDICAL HISTORY Past Medical History:  Diagnosis Date   Anxiety    Coronary artery disease    Diabetes mellitus without complication ([place])    GERD (gastroesophageal reflux disease)    Hypertension    Renal disorder    stage 3   Past Surgical History:  Procedure Laterality Date   CATARACT EXTRACTION Bilateral    [place]   CHOLECYSTECTOMY N/A 03/02/2018   Procedure: LAPAROSCOPIC CHOLECYSTECTOMY;  Surgeon: Greer Pickerel, MD;  Location: [facility];  Service: General;  Laterality: N/A;   ERCP N/A 06/26/2019   Procedure: ENDOSCOPIC RETROGRADE CHOLANGIOPANCREATOGRAPHY (ERCP);  Surgeon: [name], MD;  Location: [facility];  Service: Gastroenterology;  Laterality: N/A;   SPHINCTEROTOMY  06/26/2019   Procedure: SPHINCTEROTOMY;  Surgeon: [name], MD;  Location: [facility] ENDOSCOPY;  Service: Gastroenterology;;    FAMILY HISTORY Family History  Problem Relation Age of Onset   Heart failure Mother    Diabetes Daughter     SOCIAL HISTORY Social History   Tobacco Use   Smoking status: Never   Smokeless tobacco: Never  Vaping Use   Vaping Use: Never used  Substance Use Topics   Alcohol use: Not Currently   Drug use: Never       OPHTHALMIC EXAM: Base Eye Exam     Visual Acuity (Snellen - Linear)       Right Left   Dist [place] 20/30 -1 20/60 -1   Dist ph [place] NI NI         Tonometry (Tonopen, 1:17 PM)       Right Left   Pressure 14 18         Pupils       Dark Light Shape React APD   Right 2 1 Round Brisk None   Left 2 1 Round Brisk None         Visual Fields (Counting fingers)       Left Right    Full Full         Extraocular Movement       Right Left    Full, Ortho Full, Ortho         Neuro/Psych      Oriented x3: Yes   Mood/Affect: Normal         Dilation     Both eyes: 1.0% Mydriacyl, 2.5% Phenylephrine @ 1:17 PM           Slit Lamp and Fundus Exam     Slit Lamp Exam       Right Left   Lids/Lashes Dermatochalasis - upper lid, mild MGD Dermatochalasis - upper lid, mild MGD   Conjunctiva/Sclera  White and quiet White and quiet   Cornea trace PEE, well healed cataract wound trace PEE, well healed cataract wound   Anterior Chamber Deep and quiet deep and clear, no cell or flare   Iris Round and moderately dilated Round and moderately dilated, focal atrophy and TID at 0300   Lens PC IOL in good position with open PC PC IOL in good position with open PC   Anterior Vitreous Vitreous syneresis Vitreous syneresis, Posterior vitreous detachment         Fundus Exam       Right Left   Disc mild Pallor, Sharp rim mild Pallor, mild temporal PPA, mild cupping   C/D Ratio 0.5 0.65   Macula Flat, Blunted foveal reflex, mild ERM, RPE mottling, No heme or edema Flat, Blunted foveal reflex, ERM with +cystic changes - slightly improved, no heme   Vessels attenuated, Tortuous attenuated, Tortuous   Periphery Attached, blonde fundus Attached, blonde fundus            IMAGING AND PROCEDURES  Imaging and Procedures for 09/20/2021  OCT, Retina - OU - Both Eyes       Right Eye Quality was good. Central Foveal Thickness: 382. Progression has been stable. Findings include epiretinal membrane, no IRF, no SRF, vitreomacular adhesion , abnormal foveal contour, retinal drusen (Blunted foveal contour).   Left Eye Quality was good. Central Foveal Thickness: 433. Progression has improved. Findings include abnormal foveal contour, intraretinal fluid, epiretinal membrane, vitreomacular adhesion , no SRF, retinal drusen (Mild interval improvement in central cystic changes).   Notes *Images captured and stored on drive  Diagnosis / Impression:  ERM OU  OS: +IRF / cystic changes -- ?CME  component -- Mild interval improvement in central cystic changes  Clinical management:  See below  Abbreviations: NFP - Normal foveal profile. CME - cystoid macular edema. PED - pigment epithelial detachment. IRF - intraretinal fluid. SRF - subretinal fluid. EZ - ellipsoid zone. ERM - epiretinal membrane. ORA - outer retinal atrophy. ORT - outer retinal tubulation. SRHM - subretinal hyper-reflective material. IRHM - intraretinal hyper-reflective material            ASSESSMENT/PLAN:    ICD-10-CM   1. Epiretinal membrane (ERM) of both eyes  H35.373     2. Retinal edema  H35.81 OCT, Retina - OU - Both Eyes    3. Essential hypertension  I10     4. Hypertensive retinopathy of both eyes  H35.033     5. Pseudophakia, both eyes  Z96.1      1,2. Epiretinal membrane, both eyes  - +ERM w/ blunting of foveal contour OU -- OS with +IRF / cystic changes (CME component) - FA (11.01.22) shows mild perifoveal staining / leakage OS suggestive of CME component - started on PF and Prolensa QID OS on 11.1.22 - BCVA OD: 20/30, OS: decreased to 20/60 friom 20/50 - OCT shows mild interval improvement in IRF/cystic changes OS - continue PF and Prolensa QID OS  - will hold off on surgery until CME is addressed - f/u 4 weeks -- DFE/OCT  3,4. Hypertensive retinopathy OU - discussed importance of tight BP control - monitor  5. Pseudophakia OU  - s/p CE/IOL (Dr. Vilinda Blanks)  - IOLs in good position, doing well  - monitor   Ophthalmic Meds Ordered this visit:  No orders of the defined types were placed in this encounter.    Return in about 4 weeks (around 10/18/2021) for f/u ERM OU, DFE, OCT.  There are no Patient Instructions on file for this visit.   Explained the diagnoses, plan, and follow up with the patient and they expressed understanding.  Patient expressed understanding of the importance of proper follow up care.   This document serves as a record of services personally performed by  Gardiner Sleeper, MD, PhD. It was created on their behalf by Estill Bakes, COT an ophthalmic technician. The creation of this record is the provider's dictation and/or activities during the visit.    Electronically signed by: Estill Bakes, COT 11.18.22 @ 1:48 PM   This document serves as a record of services personally performed by Gardiner Sleeper, MD, PhD. It was created on their behalf by San Jetty. Owens Shark, OA an ophthalmic technician. The creation of this record is the provider's dictation and/or activities during the visit.    Electronically signed by: San Jetty. Owens Shark, OA  1:48 PM   Gardiner Sleeper, M.D., Ph.D. Diseases & Surgery of the Retina and Vitreous [facility]  I have reviewed the above documentation for accuracy and completeness, and I agree with the above. Gardiner Sleeper, M.D., Ph.D. 09/20/21 1:48 PM   Abbreviations: M myopia (nearsighted); A astigmatism; H hyperopia (farsighted); P presbyopia; Mrx spectacle prescription;  CTL contact lenses; OD right eye; OS left eye; OU both eyes  XT exotropia; ET esotropia; PEK punctate epithelial keratitis; PEE punctate epithelial erosions; DES dry eye syndrome; MGD meibomian gland dysfunction; ATs artificial tears; PFAT's preservative free artificial tears; [place] nuclear sclerotic cataract; PSC posterior subcapsular cataract; ERM epi-retinal membrane; PVD posterior vitreous detachment; RD retinal detachment; DM diabetes mellitus; DR diabetic retinopathy; NPDR non-proliferative diabetic retinopathy; PDR proliferative diabetic retinopathy; CSME clinically significant macular edema; DME diabetic macular edema; dbh dot blot hemorrhages; CWS cotton wool spot; POAG primary open angle glaucoma; C/D cup-to-disc ratio; HVF humphrey visual field; GVF goldmann visual field; OCT optical coherence tomography; IOP intraocular pressure; BRVO Branch retinal vein occlusion; CRVO central retinal vein occlusion; CRAO central retinal artery  occlusion; BRAO branch retinal artery occlusion; RT retinal tear; SB scleral buckle; PPV pars plana vitrectomy; VH Vitreous hemorrhage; PRP panretinal laser photocoagulation; IVK intravitreal kenalog; VMT vitreomacular traction; MH Macular hole;  NVD neovascularization of the disc; NVE neovascularization elsewhere; AREDS age related eye disease study; ARMD age related macular degeneration; POAG primary open angle glaucoma; EBMD epithelial/anterior basement membrane dystrophy; ACIOL anterior chamber intraocular lens; IOL intraocular lens; PCIOL posterior chamber intraocular lens; Phaco/IOL phacoemulsification with intraocular lens placement; [place] photorefractive keratectomy; LASIK laser assisted in situ keratomileusis; HTN hypertension; DM diabetes mellitus; COPD chronic obstructive pulmonary disease

## 2021-09-20 ENCOUNTER — Ambulatory Visit (INDEPENDENT_AMBULATORY_CARE_PROVIDER_SITE_OTHER): Payer: Medicare Other | Admitting: Ophthalmology

## 2021-09-20 ENCOUNTER — Other Ambulatory Visit: Payer: Self-pay

## 2021-09-20 ENCOUNTER — Encounter (INDEPENDENT_AMBULATORY_CARE_PROVIDER_SITE_OTHER): Payer: Self-pay | Admitting: Ophthalmology

## 2021-09-20 DIAGNOSIS — H35373 Puckering of macula, bilateral: Secondary | ICD-10-CM

## 2021-09-20 DIAGNOSIS — H35033 Hypertensive retinopathy, bilateral: Secondary | ICD-10-CM

## 2021-09-20 DIAGNOSIS — H3581 Retinal edema: Secondary | ICD-10-CM

## 2021-09-20 DIAGNOSIS — Z961 Presence of intraocular lens: Secondary | ICD-10-CM

## 2021-09-20 DIAGNOSIS — I10 Essential (primary) hypertension: Secondary | ICD-10-CM | POA: Diagnosis not present

## 2021-09-26 ENCOUNTER — Other Ambulatory Visit (INDEPENDENT_AMBULATORY_CARE_PROVIDER_SITE_OTHER): Payer: Self-pay | Admitting: Ophthalmology

## 2021-10-03 DIAGNOSIS — Z7689 Persons encountering health services in other specified circumstances: Secondary | ICD-10-CM | POA: Diagnosis not present

## 2021-10-03 DIAGNOSIS — I10 Essential (primary) hypertension: Secondary | ICD-10-CM | POA: Diagnosis not present

## 2021-10-03 DIAGNOSIS — J302 Other seasonal allergic rhinitis: Secondary | ICD-10-CM | POA: Diagnosis not present

## 2021-10-03 DIAGNOSIS — I509 Heart failure, unspecified: Secondary | ICD-10-CM | POA: Diagnosis not present

## 2021-10-03 DIAGNOSIS — E559 Vitamin D deficiency, unspecified: Secondary | ICD-10-CM | POA: Diagnosis not present

## 2021-10-03 DIAGNOSIS — Z888 Allergy status to other drugs, medicaments and biological substances status: Secondary | ICD-10-CM | POA: Diagnosis not present

## 2021-10-03 DIAGNOSIS — E1139 Type 2 diabetes mellitus with other diabetic ophthalmic complication: Secondary | ICD-10-CM | POA: Diagnosis not present

## 2021-10-05 DIAGNOSIS — Z79899 Other long term (current) drug therapy: Secondary | ICD-10-CM | POA: Diagnosis not present

## 2021-10-05 DIAGNOSIS — E559 Vitamin D deficiency, unspecified: Secondary | ICD-10-CM | POA: Diagnosis not present

## 2021-10-06 DIAGNOSIS — E785 Hyperlipidemia, unspecified: Secondary | ICD-10-CM | POA: Diagnosis not present

## 2021-10-06 DIAGNOSIS — E7841 Elevated Lipoprotein(a): Secondary | ICD-10-CM | POA: Diagnosis not present

## 2021-10-06 DIAGNOSIS — Z7689 Persons encountering health services in other specified circumstances: Secondary | ICD-10-CM | POA: Diagnosis not present

## 2021-10-06 DIAGNOSIS — E860 Dehydration: Secondary | ICD-10-CM | POA: Diagnosis not present

## 2021-10-06 DIAGNOSIS — E1165 Type 2 diabetes mellitus with hyperglycemia: Secondary | ICD-10-CM | POA: Diagnosis not present

## 2021-10-06 DIAGNOSIS — R7989 Other specified abnormal findings of blood chemistry: Secondary | ICD-10-CM | POA: Diagnosis not present

## 2021-10-06 NOTE — Progress Notes (Signed)
[facility] Note  10/18/2021     CHIEF COMPLAINT Patient presents for Retina Follow Up  HISTORY OF PRESENT ILLNESS: Nancy Webster is a 87 y.o. female who presents to the clinic today for:   HPI     Retina Follow Up   Patient presents with  Other.  In both eyes.  This started 4.  Duration of weeks.  I, the attending physician,  performed the HPI with the patient and updated documentation appropriately.        Comments   Patient here for 4 weeks retina follow up for ERM OU. Patient states vision doing poorly. Uses drops. The pink top drop burns when uses. On Zyrtec every night and Flonase nasal spray every night.       Last edited by Bernarda Caffey, MD on 10/18/2021 11:52 AM.    Pt feels like her vision is doing "poorly."  Referring physician: Luberta Mutter, MD [place],  [place] [id number]  HISTORICAL INFORMATION:  Selected notes from the MEDICAL RECORD NUMBER Referred by Dr. Ellie Lunch for eval of ERM OU   CURRENT MEDICATIONS: Current Outpatient Medications (Ophthalmic Drugs)  Medication Sig   Bromfenac Sodium (PROLENSA) 0.07 % SOLN Place 1 drop into the left eye 4 (four) times daily.   Polyethyl Glycol-Propyl Glycol (SYSTANE) 0.4-0.3 % SOLN Place 1 drop into both eyes 2 (two) times daily.   prednisoLONE acetate (PRED FORTE) 1 % ophthalmic suspension PLACE 1 DROP INTO LEFT EYE FOUR TIMES A DAY  *WAIT 3-5 MINUTES BETWEEN 2 EYE MEDS* *SHAKE WELL* *STORE UPRIGHT*   No current facility-administered medications for this visit. (Ophthalmic Drugs)   Current Outpatient Medications (Other)  Medication Sig   acetaminophen (TYLENOL) 500 MG tablet Take 500 mg by mouth every 8 (eight) hours as needed for mild pain or headache.   Biotin 5000 MCG TABS Take 1 tablet by mouth daily.   Blood Glucose Monitoring Suppl (FREESTYLE LITE) DEVI Dx: E11.9   Cholecalciferol (VITAMIN D3) 1.25 MG (50000 UT) TABS Take 1 tablet by mouth daily.   fluticasone  (FLONASE) 50 MCG/ACT nasal spray Place 1 spray into both nostrils daily.   furosemide (LASIX) 20 MG tablet Take 0.5 tablets (10 mg total) by mouth daily.   glucose blood (FREESTYLE LITE) test strip Dx: E11.9   ibuprofen (ADVIL) 200 MG tablet Take 200 mg by mouth as needed.   MEGARED OMEGA-3 KRILL OIL PO Take 1 tablet by mouth daily.   metFORMIN (GLUCOPHAGE) 1000 MG tablet Take 500 mg by mouth 2 (two) times daily.   metFORMIN (GLUCOPHAGE) 500 MG tablet Take 500 mg by mouth 2 (two) times daily.   metoprolol succinate (TOPROL XL) 25 MG 24 hr tablet Take 0.5 tablets (12.5 mg total) by mouth daily. (Patient taking differently: Take 50 mg by mouth daily.)   Multiple Vitamins-Minerals (CENTRUM SILVER ADULT 50+) TABS Take 1 tablet by mouth daily.   pantoprazole (PROTONIX) 40 MG tablet Take 40 mg by mouth daily.   sodium chloride (OCEAN) 0.65 % SOLN nasal spray Place 1 spray into both nostrils as needed for congestion.   ALPRAZolam (XANAX) 0.5 MG tablet Take 1-2 tablets by mouth as needed for anxiety (Patient not taking: Reported on 06/24/2021)   amoxicillin-clavulanate (AUGMENTIN) 500-125 MG tablet Take 1 tablet by mouth 2 (two) times daily. (Patient not taking: Reported on 06/24/2021)   benazepril (LOTENSIN) 20 MG tablet Take 20 mg by mouth daily. (Patient not taking: Reported on 06/24/2021)  diclofenac Sodium (VOLTAREN) 1 % GEL Apply topically. (Patient not taking: Reported on 06/24/2021)   gabapentin (NEURONTIN) 100 MG capsule Take 100 mg by mouth at bedtime. (Patient not taking: Reported on 06/24/2021)   triamcinolone acetonide (KENALOG-40) 40 MG/ML injection Inject 2 mLs into the muscle once. (Patient not taking: Reported on 06/24/2021)   No current facility-administered medications for this visit. (Other)   REVIEW OF SYSTEMS: ROS   Positive for: Endocrine, Cardiovascular, Eyes Negative for: Constitutional, Gastrointestinal, Neurological, Skin, Genitourinary, Musculoskeletal, HENT, Respiratory,  Psychiatric, Allergic/Imm, Heme/Lymph Last edited by Theodore Demark, COA on 10/18/2021  9:47 AM.     ALLERGIES Allergies  Allergen Reactions   Atorvastatin Other (See Comments)    UNKNOWN   Other Other (See Comments)    Cramps to all Cholesterol medications   Statins Other (See Comments)    "cramps" UNKNOWN   PAST MEDICAL HISTORY Past Medical History:  Diagnosis Date   Anxiety    Coronary artery disease    Diabetes mellitus without complication ([place])    GERD (gastroesophageal reflux disease)    Hypertension    Renal disorder    stage 3   Past Surgical History:  Procedure Laterality Date   CATARACT EXTRACTION Bilateral    Khemsara   CHOLECYSTECTOMY N/A 03/02/2018   Procedure: LAPAROSCOPIC CHOLECYSTECTOMY;  Surgeon: Greer Pickerel, MD;  Location: [place];  Service: General;  Laterality: N/A;   ERCP N/A 06/26/2019   Procedure: ENDOSCOPIC RETROGRADE CHOLANGIOPANCREATOGRAPHY (ERCP);  Surgeon: [name], MD;  Location: [place];  Service: Gastroenterology;  Laterality: N/A;   SPHINCTEROTOMY  06/26/2019   Procedure: SPHINCTEROTOMY;  Surgeon: [name], MD;  Location: [facility] ENDOSCOPY;  Service: Gastroenterology;;    FAMILY HISTORY Family History  Problem Relation Age of Onset   Heart failure Mother    Diabetes Daughter     SOCIAL HISTORY Social History   Tobacco Use   Smoking status: Never   Smokeless tobacco: Never  Vaping Use   Vaping Use: Never used  Substance Use Topics   Alcohol use: Not Currently   Drug use: Never       OPHTHALMIC EXAM: Base Eye Exam     Visual Acuity (Snellen - Linear)       Right Left   Dist cc 20/30 -2 20/60   Dist ph cc NI NI    Correction: Glasses         Tonometry (Tonopen, 9:44 AM)       Right Left   Pressure 10 13         Pupils       Dark Light Shape React APD   Right 2 1 Round Brisk None   Left 2 1 Round Brisk None         Visual Fields (Counting fingers)       Left Right    Full Full          Extraocular Movement       Right Left    Full, Ortho Full, Ortho         Neuro/Psych     Oriented x3: Yes   Mood/Affect: Normal         Dilation     Both eyes: 1.0% Mydriacyl, 2.5% Phenylephrine @ 9:43 AM           Slit Lamp and Fundus Exam     Slit Lamp Exam       Right Left   Lids/Lashes Dermatochalasis - upper lid, mild MGD Dermatochalasis - upper  lid, mild MGD   Conjunctiva/Sclera White and quiet White and quiet   Cornea trace PEE, well healed cataract wound 2+ fine PEE, well healed cataract wound   Anterior Chamber Deep and quiet deep and clear, no cell or flare   Iris Round and moderately dilated Round and moderately dilated, focal atrophy and TID at 0300   Lens PC IOL in good position with open PC PC IOL in good position with open PC   Anterior Vitreous Vitreous syneresis Vitreous syneresis, Posterior vitreous detachment         Fundus Exam       Right Left   Disc mild Pallor, Sharp rim mild Pallor, mild temporal PPA, mild cupping   C/D Ratio 0.5 0.65   Macula Flat, Blunted foveal reflex, mild ERM, RPE mottling, No heme or edema Flat, Blunted foveal reflex, ERM with +cystic changes - slightly increased, no heme   Vessels attenuated, Tortuous attenuated, Tortuous   Periphery Attached, blonde fundus Attached, blonde fundus           Refraction     Wearing Rx       Sphere Cylinder Axis   Right +0.75 Sphere    Left -1.00 +1.00 022            IMAGING AND PROCEDURES  Imaging and Procedures for 10/18/2021  OCT, Retina - OU - Both Eyes       Right Eye Quality was good. Central Foveal Thickness: 386. Progression has been stable. Findings include epiretinal membrane, no IRF, no SRF, vitreomacular adhesion , abnormal foveal contour, retinal drusen (Blunted foveal contour).   Left Eye Quality was good. Central Foveal Thickness: 469. Progression has worsened. Findings include abnormal foveal contour, intraretinal fluid, epiretinal  membrane, vitreomacular adhesion , no SRF, retinal drusen (Mild interval increase in central cystic changes).   Notes *Images captured and stored on drive  Diagnosis / Impression:  ERM OU  OS: +IRF / cystic changes -- ?CME component -- Mild interval increase in central cystic changes  Clinical management:  See below  Abbreviations: NFP - Normal foveal profile. CME - cystoid macular edema. PED - pigment epithelial detachment. IRF - intraretinal fluid. SRF - subretinal fluid. EZ - ellipsoid zone. ERM - epiretinal membrane. ORA - outer retinal atrophy. ORT - outer retinal tubulation. SRHM - subretinal hyper-reflective material. IRHM - intraretinal hyper-reflective material      Injection into Tenon's Capsule - OS - Left Eye       Time Out 10/18/2021. 10:22 AM. Confirmed correct patient, procedure, site, and patient consented.   Anesthesia Topical anesthesia was used. Anesthetic medications included Lidocaine 2%, Proparacaine 0.5%.   Procedure Preparation included 5% betadine to ocular surface. A 27 gauge needle was used.   Injection: 40 mg triamcinolone acetonide 40 MG/ML   Route: Intravitreal, Site: Left Eye   NDC: [phone], Lot: [id number], Expiration date: 03/30/2023   Post-op Post injection exam found visual acuity of at least counting fingers, no retinal detachment. The patient tolerated the procedure well. There were no complications. The patient received written and verbal post procedure care education. Post injection medications were not given.   Notes 1 cc of Kenalog-40 (40 mg) injected into subtenon's capsule in the superotemporal quadrant. Betadine was applied to Injection area pre and post-injection then rinsed with sterile BSS. 1 drop of polymixin was instilled into the eye. There were no complications. Pt tolerated procedure well.            ASSESSMENT/PLAN:    ICD-10-CM  1. Epiretinal membrane (ERM) of both eyes  H35.373 OCT, Retina - OU - Both Eyes    2.  Cystoid macular edema of left eye  H35.352 OCT, Retina - OU - Both Eyes    Injection into Tenon's Capsule - OS - Left Eye    triamcinolone acetonide (KENALOG-40) injection 40 mg    3. Essential hypertension  I10     4. Hypertensive retinopathy of both eyes  H35.033     5. Pseudophakia, both eyes  Z96.1      1,2. Epiretinal membrane, both eyes  - +ERM w/ blunting of foveal contour OU -- OS with +IRF / cystic changes (CME component) - FA (11.01.22) shows mild perifoveal staining / leakage OS suggestive of CME component - started on PF and Prolensa QID OS on 11.1.22 - BCVA OD: 20/30, OS: Stable at 20/60 - OCT shows mild interval increase in IRF/cystic changes OS -- worse CME - continue PF and Prolensa QID OS - Recommend STK #1 today, 12.20.22 - Risks and benefits of tx discussed w/pt - Pt desires to proceed w/tx today - Consent signed 12.20.22  - will hold off on ERM surgery until CME is fully addressed - f/u 4 weeks -- DFE/OCT  3,4. Hypertensive retinopathy OU - discussed importance of tight BP control - monitor  5. Pseudophakia OU  - s/p CE/IOL (Dr. Vilinda Blanks)  - IOLs in good position, doing well  - monitor  Ophthalmic Meds Ordered this visit:  Meds ordered this encounter  Medications   triamcinolone acetonide (KENALOG-40) injection 40 mg      Return in about 4 weeks (around 11/15/2021) for 4 wk f/u for CME OS w/DFE/OCT/poss STK OS.  There are no Patient Instructions on file for this visit.  Explained the diagnoses, plan, and follow up with the patient and they expressed understanding.  Patient expressed understanding of the importance of proper follow up care.   This document serves as a record of services personally performed by Gardiner Sleeper, MD, PhD. It was created on their behalf by [place] Bakes, COT an ophthalmic technician. The creation of this record is the provider's dictation and/or activities during the visit.    Electronically signed by: Estill Bakes, COT  12.20.22 @ 11:55 AM   Gardiner Sleeper, M.D., Ph.D. Diseases & Surgery of the Retina and [place] 12.20.22  I have reviewed the above documentation for accuracy and completeness, and I agree with the above. Gardiner Sleeper, M.D., Ph.D. 10/18/21 11:55 AM   Abbreviations: M myopia (nearsighted); A astigmatism; H hyperopia (farsighted); P presbyopia; Mrx spectacle prescription;  CTL contact lenses; OD right eye; OS left eye; OU both eyes  XT exotropia; ET esotropia; PEK punctate epithelial keratitis; PEE punctate epithelial erosions; DES dry eye syndrome; MGD meibomian gland dysfunction; ATs artificial tears; PFAT's preservative free artificial tears; [place] nuclear sclerotic cataract; PSC posterior subcapsular cataract; ERM epi-retinal membrane; PVD posterior vitreous detachment; RD retinal detachment; DM diabetes mellitus; DR diabetic retinopathy; NPDR non-proliferative diabetic retinopathy; PDR proliferative diabetic retinopathy; CSME clinically significant macular edema; DME diabetic macular edema; dbh dot blot hemorrhages; CWS cotton wool spot; POAG primary open angle glaucoma; C/D cup-to-disc ratio; HVF humphrey visual field; GVF goldmann visual field; OCT optical coherence tomography; IOP intraocular pressure; BRVO Branch retinal vein occlusion; CRVO central retinal vein occlusion; CRAO central retinal artery occlusion; BRAO branch retinal artery occlusion; RT retinal tear; SB scleral buckle; PPV pars plana vitrectomy; VH Vitreous hemorrhage; PRP panretinal laser photocoagulation;  IVK intravitreal kenalog; VMT vitreomacular traction; MH Macular hole;  NVD neovascularization of the disc; NVE neovascularization elsewhere; AREDS age related eye disease study; ARMD age related macular degeneration; POAG primary open angle glaucoma; EBMD epithelial/anterior basement membrane dystrophy; ACIOL anterior chamber intraocular lens; IOL intraocular lens; PCIOL posterior chamber  intraocular lens; Phaco/IOL phacoemulsification with intraocular lens placement; [place] photorefractive keratectomy; LASIK laser assisted in situ keratomileusis; HTN hypertension; DM diabetes mellitus; COPD chronic obstructive pulmonary disease

## 2021-10-18 ENCOUNTER — Other Ambulatory Visit: Payer: Self-pay

## 2021-10-18 ENCOUNTER — Encounter (INDEPENDENT_AMBULATORY_CARE_PROVIDER_SITE_OTHER): Payer: Self-pay | Admitting: Ophthalmology

## 2021-10-18 ENCOUNTER — Ambulatory Visit (INDEPENDENT_AMBULATORY_CARE_PROVIDER_SITE_OTHER): Payer: Medicare Other | Admitting: Ophthalmology

## 2021-10-18 DIAGNOSIS — H35352 Cystoid macular degeneration, left eye: Secondary | ICD-10-CM | POA: Diagnosis not present

## 2021-10-18 DIAGNOSIS — Z961 Presence of intraocular lens: Secondary | ICD-10-CM | POA: Diagnosis not present

## 2021-10-18 DIAGNOSIS — H3581 Retinal edema: Secondary | ICD-10-CM

## 2021-10-18 DIAGNOSIS — H35373 Puckering of macula, bilateral: Secondary | ICD-10-CM | POA: Diagnosis not present

## 2021-10-18 DIAGNOSIS — H35033 Hypertensive retinopathy, bilateral: Secondary | ICD-10-CM

## 2021-10-18 DIAGNOSIS — I10 Essential (primary) hypertension: Secondary | ICD-10-CM

## 2021-10-18 MED ORDER — TRIAMCINOLONE ACETONIDE 40 MG/ML IJ SUSP FOR KALEIDOSCOPE
40.0000 mg | INTRAMUSCULAR | Status: AC | PRN
  Administered 2021-10-18: 12:00:00 40 mg via INTRAVITREAL

## 2021-10-28 DIAGNOSIS — J019 Acute sinusitis, unspecified: Secondary | ICD-10-CM | POA: Diagnosis not present

## 2021-10-28 DIAGNOSIS — R0982 Postnasal drip: Secondary | ICD-10-CM | POA: Diagnosis not present

## 2021-10-28 DIAGNOSIS — R519 Headache, unspecified: Secondary | ICD-10-CM | POA: Diagnosis not present

## 2021-10-28 DIAGNOSIS — J309 Allergic rhinitis, unspecified: Secondary | ICD-10-CM | POA: Diagnosis not present

## 2021-11-01 NOTE — Progress Notes (Signed)
Cardiology Office Note   Date:  11/03/2021   ID:  Nancy Webster, DOB Mar 16, 1928, MRN [id number]  PCP:  Lajean Manes, MD  Cardiologist:   Dorris Carnes, MD   Patient presents for follow-up of dizziness    History of Present Illness: Nancy Webster is a 88 y.o. female with a history of SVT, DM (she says dx around age 61 yo), HTN, HL, GERD, CKD, trigeminal neuralgia (s/p gamma knife). I last saw the pt in clinic in March 2022   She was orthostatic at visit  Pulled back on meds     She continued to have problems with this and pulled back further  I saw her in Aug 2022  I recomm stopping Imdur and lasix   She has since gone back on 10 mg lasix.  She stopped benazepril   Cut metoprolol to 25  She says she has an occasional weak feeling   Will prop herself up   No frank dizziness or presyncope/syncope Nurse checks BP once per week   Range:  128 to 170/  = Current Meds  Medication Sig   acetaminophen (TYLENOL) 500 MG tablet Take 500 mg by mouth every 8 (eight) hours as needed for mild pain or headache.   Biotin 5000 MCG TABS Take 1 tablet by mouth daily.   Blood Glucose Monitoring Suppl (FREESTYLE LITE) DEVI    Bromfenac Sodium (PROLENSA) 0.07 % SOLN Place 1 drop into the left eye 4 (four) times daily.   cetirizine (ZYRTEC) 10 MG tablet Take 10 mg by mouth daily.   Cholecalciferol (VITAMIN D3) 1.25 MG (50000 UT) TABS Take 1 tablet by mouth daily.   diclofenac Sodium (VOLTAREN) 1 % GEL Apply topically.   fluticasone (FLONASE) 50 MCG/ACT nasal spray Place 1 spray into both nostrils daily.   furosemide (LASIX) 20 MG tablet Take 0.5 tablets (10 mg total) by mouth daily.   glucose blood (FREESTYLE LITE) test strip    ibuprofen (ADVIL) 200 MG tablet Take 200 mg by mouth as needed.   MEGARED OMEGA-3 KRILL OIL PO Take 1 tablet by mouth daily.   metFORMIN (GLUCOPHAGE) 1000 MG tablet Take 500 mg by mouth 2 (two) times daily.   metFORMIN (GLUCOPHAGE) 500 MG tablet Take 500 mg by mouth 2 (two) times  daily.   metoprolol succinate (TOPROL XL) 25 MG 24 hr tablet Take 0.5 tablets (12.5 mg total) by mouth daily.   Multiple Vitamins-Minerals (CENTRUM SILVER ADULT 50+) TABS Take 1 tablet by mouth daily.   pantoprazole (PROTONIX) 40 MG tablet Take 40 mg by mouth daily.   Polyethyl Glycol-Propyl Glycol (SYSTANE) 0.4-0.3 % SOLN Place 1 drop into both eyes 2 (two) times daily.   prednisoLONE acetate (PRED FORTE) 1 % ophthalmic suspension PLACE 1 DROP INTO LEFT EYE FOUR TIMES A DAY  *WAIT 3-5 MINUTES BETWEEN 2 EYE MEDS* *SHAKE WELL* *STORE UPRIGHT*   sodium chloride (OCEAN) 0.65 % SOLN nasal spray Place 1 spray into both nostrils as needed for congestion.   triamcinolone acetonide (KENALOG-40) 40 MG/ML injection Inject 2 mLs into the muscle once.     Allergies:   Atorvastatin, Other, and Statins   Past Medical History:  Diagnosis Date   Anxiety    Coronary artery disease    Diabetes mellitus without complication ([facility])    GERD (gastroesophageal reflux disease)    Hypertension    Renal disorder    stage 3    Past Surgical History:  Procedure Laterality Date   CATARACT EXTRACTION  Bilateral    Khemsara   CHOLECYSTECTOMY N/A 03/02/2018   Procedure: LAPAROSCOPIC CHOLECYSTECTOMY;  Surgeon: Greer Pickerel, MD;  Location: [facility];  Service: General;  Laterality: N/A;   ERCP N/A 06/26/2019   Procedure: ENDOSCOPIC RETROGRADE CHOLANGIOPANCREATOGRAPHY (ERCP);  Surgeon: [name], MD;  Location: [facility];  Service: Gastroenterology;  Laterality: N/A;   SPHINCTEROTOMY  06/26/2019   Procedure: SPHINCTEROTOMY;  Surgeon: [name], MD;  Location: [facility] ENDOSCOPY;  Service: Gastroenterology;;     Social History:  The patient  reports that she has never smoked. She has never used smokeless tobacco. She reports that she does not currently use alcohol. She reports that she does not use drugs.   Family History:  The patient's family history includes Diabetes in her daughter; Heart failure in her mother.     ROS:  Please see the history of present illness. All other systems are reviewed and  Negative to the above problem except as noted.    PHYSICAL EXAM: VS:  BP (!) 162/66    Pulse 70    Ht 5\' 3"  (1.6 m)    Wt 155 lb 3.2 oz (70.4 kg)    SpO2 96%    BMI 27.49 kg/m    GEN: Well nourished, well developed, in no acute distress  HEENT: normal  Neck: no JVD, carotid bruits, Cardiac: RRR; no murmurs  No  LE edema  Respiratory:  clear to auscultation bilaterally GI: soft, nontender, nondistended, + BS  No hepatomegaly  MS: no deformity Moving all extremities   Skin: warm and dry, no rash Neuro:  Strength and sensation are intact Psych: euthymic mood, full affect   EKG:  EKG is not  ordered today.    Lipid Panel    Component Value Date/Time   CHOL 193 06/24/2019 2207   TRIG 130 06/24/2019 2207   HDL 48 06/24/2019 2207   CHOLHDL 4.0 06/24/2019 2207   VLDL 26 06/24/2019 2207   LDLCALC 119 (H) 06/24/2019 2207      Wt Readings from Last 3 Encounters:  11/03/21 155 lb 3.2 oz (70.4 kg)  06/24/21 152 lb 9.6 oz (69.2 kg)  12/29/20 153 lb 6.4 oz (69.6 kg)      ASSESSMENT AND PLAN:  1 Dizziness   Symptoms are MUCH improved with current regimen    Her BP is high at times    but mostly not bad Recomm:   Continue to check BP  Even check more regularly   Can take 1/4 benazepril if BP over 160/  2  HTN   As above   3  SVT   No symptoms to sugg recurrence  4  HL    Follow   Get labs from [facility]  Pt intolerant to statin     Current medicines are reviewed at length with the patient today.  The patient does not have concerns regarding medicines.  Signed, Dorris Carnes, MD  11/03/2021 9:28 PM    [address], [place], [place]  [address] Phone: [phone]; Fax: [phone]

## 2021-11-03 ENCOUNTER — Ambulatory Visit (INDEPENDENT_AMBULATORY_CARE_PROVIDER_SITE_OTHER): Payer: Medicare Other | Admitting: Internal Medicine

## 2021-11-03 ENCOUNTER — Telehealth: Payer: Self-pay

## 2021-11-03 ENCOUNTER — Other Ambulatory Visit: Payer: Self-pay

## 2021-11-03 VITALS — BP 162/66 | HR 70 | Ht 63.0 in | Wt 155.2 lb

## 2021-11-03 DIAGNOSIS — J310 Chronic rhinitis: Secondary | ICD-10-CM | POA: Diagnosis not present

## 2021-11-03 NOTE — Telephone Encounter (Signed)
Request for recent labwork faxed to Dr. Carlyle Lipa office.

## 2021-11-03 NOTE — Patient Instructions (Signed)
Medication Instructions:  Take1/4 Benazepril when your BP is elevated *If you need a refill on your cardiac medications before your next appointment, please call your pharmacy*   Lab Work: none If you have labs (blood work) drawn today and your tests are completely normal, you will receive your results only by: [place] (if you have MyChart) OR A paper copy in the mail If you have any lab test that is abnormal or we need to change your treatment, we will call you to review the results.   Testing/Procedures: none   Follow-Up: At [facility], you and your health needs are our priority.  As part of our continuing mission to provide you with exceptional heart care, we have created designated Provider Care Teams.  These Care Teams include your primary Cardiologist (physician) and Advanced Practice Providers (APPs -  Physician Assistants and Nurse Practitioners) who all work together to provide you with the care you need, when you need it.  We recommend signing up for the patient portal called "MyChart".  Sign up information is provided on this After Visit Summary.  MyChart is used to connect with patients for Virtual Visits (Telemedicine).  Patients are able to view lab/test results, encounter notes, upcoming appointments, etc.  Non-urgent messages can be sent to your provider as well.   To learn more about what you can do with MyChart, go to [email or web address].    Your next appointment:   6 month(s)  The format for your next appointment:   In Person  Provider:   Dorris Carnes, MD     Other Instructions Referral to Dr.Shoemaker

## 2021-11-04 NOTE — Telephone Encounter (Signed)
Labs received and reviewed by Dr. Harrington Challenger. See lab results.

## 2021-11-08 DIAGNOSIS — G500 Trigeminal neuralgia: Secondary | ICD-10-CM | POA: Diagnosis not present

## 2021-11-10 DIAGNOSIS — Z7689 Persons encountering health services in other specified circumstances: Secondary | ICD-10-CM | POA: Diagnosis not present

## 2021-11-10 DIAGNOSIS — Z8709 Personal history of other diseases of the respiratory system: Secondary | ICD-10-CM | POA: Diagnosis not present

## 2021-11-14 ENCOUNTER — Other Ambulatory Visit (INDEPENDENT_AMBULATORY_CARE_PROVIDER_SITE_OTHER): Payer: Self-pay | Admitting: Ophthalmology

## 2021-11-15 ENCOUNTER — Ambulatory Visit (INDEPENDENT_AMBULATORY_CARE_PROVIDER_SITE_OTHER): Payer: Medicare Other | Admitting: Ophthalmology

## 2021-11-15 ENCOUNTER — Encounter (INDEPENDENT_AMBULATORY_CARE_PROVIDER_SITE_OTHER): Payer: Self-pay | Admitting: Ophthalmology

## 2021-11-15 ENCOUNTER — Other Ambulatory Visit: Payer: Self-pay

## 2021-11-15 DIAGNOSIS — Z961 Presence of intraocular lens: Secondary | ICD-10-CM

## 2021-11-15 DIAGNOSIS — H35352 Cystoid macular degeneration, left eye: Secondary | ICD-10-CM | POA: Diagnosis not present

## 2021-11-15 DIAGNOSIS — H35033 Hypertensive retinopathy, bilateral: Secondary | ICD-10-CM

## 2021-11-15 DIAGNOSIS — I10 Essential (primary) hypertension: Secondary | ICD-10-CM

## 2021-11-15 DIAGNOSIS — H35373 Puckering of macula, bilateral: Secondary | ICD-10-CM

## 2021-11-15 MED ORDER — PROLENSA 0.07 % OP SOLN: 1.0000 [drp] | Freq: Four times a day (QID) | OPHTHALMIC | 5 refills | Status: DC

## 2021-11-15 MED ORDER — PREDNISOLONE ACETATE 1 % OP SUSP: OPHTHALMIC | 5 refills | Status: DC

## 2021-11-15 NOTE — Progress Notes (Signed)
[facility] Note  11/15/2021     CHIEF COMPLAINT Patient presents for Retina Follow Up  HISTORY OF PRESENT ILLNESS: Nancy Webster is a 88 y.o. female who presents to the clinic today for:   HPI     Retina Follow Up   Patient presents with  Other.  In both eyes.  Severity is moderate.  Duration of 4 weeks.  Since onset it is stable.  I, the attending physician,  performed the HPI with the patient and updated documentation appropriately.        Comments   Pt here for 4 wk ret f/u for ERM OU. Pt states vision is the same. She does report running out of Prolensa gtts yesterday eve. She has not had any dose of PF or Prolensa today.       Last edited by Bernarda Caffey, MD on 11/16/2021  8:53 AM.    Pt states she can't tell much difference in her vision since receiving STK injection, pt was using PF and Prolensa until today, she states she ran out of [place] today  Referring physician: Luberta Mutter, MD [place],  [place] [address]  HISTORICAL INFORMATION:  Selected notes from the MEDICAL RECORD NUMBER Referred by Dr. Ellie Lunch for eval of ERM OU   CURRENT MEDICATIONS: Current Outpatient Medications (Ophthalmic Drugs)  Medication Sig   Polyethyl Glycol-Propyl Glycol (SYSTANE) 0.4-0.3 % SOLN Place 1 drop into both eyes 2 (two) times daily.   Bromfenac Sodium (PROLENSA) 0.07 % SOLN Place 1 drop into the left eye 4 (four) times daily.   prednisoLONE acetate (PRED FORTE) 1 % ophthalmic suspension PLACE 1 DROP INTO LEFT EYE FOUR TIMES A DAY  *WAIT 3-5 MINUTES BETWEEN 2 EYE MEDS* *SHAKE WELL* *STORE UPRIGHT*   No current facility-administered medications for this visit. (Ophthalmic Drugs)   Current Outpatient Medications (Other)  Medication Sig   acetaminophen (TYLENOL) 500 MG tablet Take 500 mg by mouth every 8 (eight) hours as needed for mild pain or headache.   Biotin 5000 MCG TABS Take 1 tablet by mouth daily.   Blood Glucose Monitoring  Suppl (FREESTYLE LITE) DEVI    cetirizine (ZYRTEC) 10 MG tablet Take 10 mg by mouth daily.   Cholecalciferol (VITAMIN D3) 1.25 MG (50000 UT) TABS Take 1 tablet by mouth daily.   diclofenac Sodium (VOLTAREN) 1 % GEL Apply topically.   furosemide (LASIX) 20 MG tablet Take 0.5 tablets (10 mg total) by mouth daily.   glucose blood (FREESTYLE LITE) test strip    ibuprofen (ADVIL) 200 MG tablet Take 200 mg by mouth as needed.   MEGARED OMEGA-3 KRILL OIL PO Take 1 tablet by mouth daily.   metFORMIN (GLUCOPHAGE) 1000 MG tablet Take 500 mg by mouth 2 (two) times daily.   metFORMIN (GLUCOPHAGE) 500 MG tablet Take 500 mg by mouth 2 (two) times daily.   metoprolol succinate (TOPROL XL) 25 MG 24 hr tablet Take 0.5 tablets (12.5 mg total) by mouth daily.   Multiple Vitamins-Minerals (CENTRUM SILVER ADULT 50+) TABS Take 1 tablet by mouth daily.   pantoprazole (PROTONIX) 40 MG tablet Take 40 mg by mouth daily.   sodium chloride (OCEAN) 0.65 % SOLN nasal spray Place 1 spray into both nostrils as needed for congestion.   triamcinolone acetonide (KENALOG-40) 40 MG/ML injection Inject 2 mLs into the muscle once.   ALPRAZolam (XANAX) 0.5 MG tablet Take 1-2 tablets by mouth as needed for anxiety (Patient not taking: Reported  on 06/24/2021)   amoxicillin-clavulanate (AUGMENTIN) 500-125 MG tablet Take 1 tablet by mouth 2 (two) times daily. (Patient not taking: Reported on 11/03/2021)   benazepril (LOTENSIN) 20 MG tablet Take 20 mg by mouth daily. (Patient not taking: Reported on 06/24/2021)   fluticasone (FLONASE) 50 MCG/ACT nasal spray Place 1 spray into both nostrils daily. (Patient not taking: Reported on 11/15/2021)   gabapentin (NEURONTIN) 100 MG capsule Take 100 mg by mouth at bedtime. (Patient not taking: Reported on 11/03/2021)   No current facility-administered medications for this visit. (Other)   REVIEW OF SYSTEMS: ROS   Positive for: Endocrine, Cardiovascular, Eyes Negative for: Constitutional,  Gastrointestinal, Neurological, Skin, Genitourinary, Musculoskeletal, HENT, Respiratory, Psychiatric, Allergic/Imm, Heme/Lymph Last edited by Kingsley Spittle, COT on 11/15/2021  1:25 PM.      ALLERGIES Allergies  Allergen Reactions   Atorvastatin Other (See Comments)    UNKNOWN   Other Other (See Comments)    Cramps to all Cholesterol medications   Statins Other (See Comments)    "cramps" UNKNOWN   PAST MEDICAL HISTORY Past Medical History:  Diagnosis Date   Anxiety    Coronary artery disease    Diabetes mellitus without complication ([place])    GERD (gastroesophageal reflux disease)    Hypertension    Renal disorder    stage 3   Past Surgical History:  Procedure Laterality Date   CATARACT EXTRACTION Bilateral    [place]   CHOLECYSTECTOMY N/A 03/02/2018   Procedure: LAPAROSCOPIC CHOLECYSTECTOMY;  Surgeon: Greer Pickerel, MD;  Location: [facility];  Service: General;  Laterality: N/A;   ERCP N/A 06/26/2019   Procedure: ENDOSCOPIC RETROGRADE CHOLANGIOPANCREATOGRAPHY (ERCP);  Surgeon: [name], MD;  Location: [facility];  Service: Gastroenterology;  Laterality: N/A;   SPHINCTEROTOMY  06/26/2019   Procedure: SPHINCTEROTOMY;  Surgeon: [name], MD;  Location: [facility] ENDOSCOPY;  Service: Gastroenterology;;    FAMILY HISTORY Family History  Problem Relation Age of Onset   Heart failure Mother    Diabetes Daughter     SOCIAL HISTORY Social History   Tobacco Use   Smoking status: Never   Smokeless tobacco: Never  Vaping Use   Vaping Use: Never used  Substance Use Topics   Alcohol use: Not Currently   Drug use: Never       OPHTHALMIC EXAM: Base Eye Exam     Visual Acuity (Snellen - Linear)       Right Left   Dist cc 20/25 -1 20/60 -1   Dist ph cc NI NI    Correction: Glasses         Tonometry (Tonopen, 1:40 PM)       Right Left   Pressure 12 13         Pupils       Dark Light Shape React APD   Right 2 1 Round Brisk None   Left 2 1 Round Brisk  None         Visual Fields (Counting fingers)       Left Right    Full Full         Extraocular Movement       Right Left    Full, Ortho Full, Ortho         Neuro/Psych     Oriented x3: Yes   Mood/Affect: Normal         Dilation     Both eyes: 1.0% Mydriacyl, 2.5% Phenylephrine @ 1:40 PM           Slit  Lamp and Fundus Exam     Slit Lamp Exam       Right Left   Lids/Lashes Dermatochalasis - upper lid, mild MGD Dermatochalasis - upper lid, mild MGD   Conjunctiva/Sclera White and quiet White and quiet, STK ST quad   Cornea trace PEE, well healed cataract wound 2+ fine PEE, well healed cataract wound, mild tear film debris   Anterior Chamber Deep and quiet deep and clear, no cell or flare   Iris Round and moderately dilated Round and moderately dilated, focal atrophy and TID at 0300   Lens PC IOL in good position with open PC PC IOL in good position with open PC   Anterior Vitreous Vitreous syneresis Vitreous syneresis, Posterior vitreous detachment         Fundus Exam       Right Left   Disc mild Pallor, Sharp rim mild Pallor, mild temporal PPA, mild cupping   C/D Ratio 0.5 0.65   Macula Flat, Blunted foveal reflex, mild ERM, RPE mottling, No heme or edema Flat, Blunted foveal reflex, ERM with +cystic changes - slightly improved, no heme   Vessels attenuated, Tortuous attenuated, Tortuous   Periphery Attached, blonde fundus Attached, blonde fundus, focal IRH superior midzone           Refraction     Wearing Rx       Sphere Cylinder Axis   Right +0.75 Sphere    Left -1.00 +1.00 022            IMAGING AND PROCEDURES  Imaging and Procedures for 11/15/2021  OCT, Retina - OU - Both Eyes       Right Eye Quality was good. Central Foveal Thickness: 377. Progression has been stable. Findings include epiretinal membrane, no IRF, no SRF, vitreomacular adhesion , abnormal foveal contour, retinal drusen (Blunted foveal contour).   Left  Eye Quality was good. Central Foveal Thickness: 423. Progression has improved. Findings include abnormal foveal contour, intraretinal fluid, epiretinal membrane, vitreomacular adhesion , no SRF, retinal drusen (Mild interval improvement in IRF/central cystic changes, ERM stable).   Notes *Images captured and stored on drive  Diagnosis / Impression:  ERM OU  OS: +IRF / cystic changes -- ?CME component -- Mild interval improvement in IRF/central cystic changes, ERM stable  Clinical management:  See below  Abbreviations: NFP - Normal foveal profile. CME - cystoid macular edema. PED - pigment epithelial detachment. IRF - intraretinal fluid. SRF - subretinal fluid. EZ - ellipsoid zone. ERM - epiretinal membrane. ORA - outer retinal atrophy. ORT - outer retinal tubulation. SRHM - subretinal hyper-reflective material. IRHM - intraretinal hyper-reflective material             ASSESSMENT/PLAN:    ICD-10-CM   1. Epiretinal membrane (ERM) of both eyes  H35.373     2. Cystoid macular edema of left eye  H35.352 OCT, Retina - OU - Both Eyes    3. Essential hypertension  I10     4. Hypertensive retinopathy of both eyes  H35.033     5. Pseudophakia, both eyes  Z96.1      1,2. Epiretinal membrane, both eyes  - +ERM w/ blunting of foveal contour OU -- OS with +IRF / cystic changes (CME component) - FA (11.01.22) shows mild perifoveal staining / leakage OS suggestive of CME component - started on PF and Prolensa QID OS on 11.1.22 - s/p STK OS #1 (12.20.22) - BCVA OD: 20/25, OS: Stable at 20/60 - OCT shows mild interval  improvement in IRF/central cystic changes OS, ERM stable OU - continue PF and Prolensa QID OS - STK consent signed 12.20.22  - will hold off on ERM surgery until CME is fully addressed - f/u 6 weeks -- DFE/OCT  3,4. Hypertensive retinopathy OU - discussed importance of tight BP control - monitor  5. Pseudophakia OU  - s/p CE/IOL (Dr. Vilinda Blanks)  - IOLs in good  position, doing well  - monitor  Ophthalmic Meds Ordered this visit:  Meds ordered this encounter  Medications   Bromfenac Sodium (PROLENSA) 0.07 % SOLN    Sig: Place 1 drop into the left eye 4 (four) times daily.    Dispense:  3 mL    Refill:  5   prednisoLONE acetate (PRED FORTE) 1 % ophthalmic suspension    Sig: PLACE 1 DROP INTO LEFT EYE FOUR TIMES A DAY  *WAIT 3-5 MINUTES BETWEEN 2 EYE MEDS* *SHAKE WELL* *STORE UPRIGHT*    Dispense:  15 mL    Refill:  5    patient is requesting refill     Return in about 6 weeks (around 12/27/2021) for f/u ERM OU, DFE, OCT.  There are no Patient Instructions on file for this visit.  Explained the diagnoses, plan, and follow up with the patient and they expressed understanding.  Patient expressed understanding of the importance of proper follow up care.   This document serves as a record of services personally performed by Gardiner Sleeper, MD, PhD. It was created on their behalf by Estill Bakes, COT an ophthalmic technician. The creation of this record is the provider's dictation and/or activities during the visit.    Electronically signed by: Estill Bakes, COT 1.17.23 @ 8:54 AM   This document serves as a record of services personally performed by Gardiner Sleeper, MD, PhD. It was created on their behalf by San Jetty. Owens Shark, OA an ophthalmic technician. The creation of this record is the provider's dictation and/or activities during the visit.    Electronically signed by: San Jetty. Owens Shark, [place] 01.17.2023 8:54 AM  Gardiner Sleeper, M.D., Ph.D. Diseases & Surgery of the Retina and Vitreous [facility]  I have reviewed the above documentation for accuracy and completeness, and I agree with the above. Gardiner Sleeper, M.D., Ph.D. 11/16/21 8:57 AM   Abbreviations: M myopia (nearsighted); A astigmatism; H hyperopia (farsighted); P presbyopia; Mrx spectacle prescription;  CTL contact lenses; OD right eye; OS left eye; OU both  eyes  XT exotropia; ET esotropia; PEK punctate epithelial keratitis; PEE punctate epithelial erosions; DES dry eye syndrome; MGD meibomian gland dysfunction; ATs artificial tears; PFAT's preservative free artificial tears; [place] nuclear sclerotic cataract; PSC posterior subcapsular cataract; ERM epi-retinal membrane; PVD posterior vitreous detachment; RD retinal detachment; DM diabetes mellitus; DR diabetic retinopathy; NPDR non-proliferative diabetic retinopathy; PDR proliferative diabetic retinopathy; CSME clinically significant macular edema; DME diabetic macular edema; dbh dot blot hemorrhages; CWS cotton wool spot; POAG primary open angle glaucoma; C/D cup-to-disc ratio; HVF humphrey visual field; GVF goldmann visual field; OCT optical coherence tomography; IOP intraocular pressure; BRVO Branch retinal vein occlusion; CRVO central retinal vein occlusion; CRAO central retinal artery occlusion; BRAO branch retinal artery occlusion; RT retinal tear; SB scleral buckle; PPV pars plana vitrectomy; VH Vitreous hemorrhage; PRP panretinal laser photocoagulation; IVK intravitreal kenalog; VMT vitreomacular traction; MH Macular hole;  NVD neovascularization of the disc; NVE neovascularization elsewhere; AREDS age related eye disease study; ARMD age related macular degeneration; POAG primary open angle glaucoma;  EBMD epithelial/anterior basement membrane dystrophy; ACIOL anterior chamber intraocular lens; IOL intraocular lens; PCIOL posterior chamber intraocular lens; Phaco/IOL phacoemulsification with intraocular lens placement; [place] photorefractive keratectomy; LASIK laser assisted in situ keratomileusis; HTN hypertension; DM diabetes mellitus; COPD chronic obstructive pulmonary disease

## 2021-11-16 ENCOUNTER — Encounter (INDEPENDENT_AMBULATORY_CARE_PROVIDER_SITE_OTHER): Payer: Self-pay | Admitting: Ophthalmology

## 2021-12-19 NOTE — Progress Notes (Signed)
[facility] Note  12/27/2021     CHIEF COMPLAINT Patient presents for Retina Follow Up  HISTORY OF PRESENT ILLNESS: Nancy Webster is a 88 y.o. female who presents to the clinic today for:   HPI     Retina Follow Up   Patient presents with  Other.  In both eyes.  Duration of 6 weeks.  Since onset it is stable.  I, the attending physician,  performed the HPI with the patient and updated documentation appropriately.        Comments   6 week follow up ERM OU- Still having a lot of trouble seeing when sewing.  She doesn't believe this is any worse. Patient ran out of Prolensa 3 days ago.  Still using Prednisolone QID OS. She has had some discomfort that will be around her right eye, down her nose, and around the right side of mouth.  Only last a minute on occasion.  Does not happen every day.  BS 153 this morning, A1C unsure      Last edited by Bernarda Caffey, MD on 12/29/2021 12:10 AM.     Referring physician: Lajean Manes, MD [address] [place],  [place] [address]  HISTORICAL INFORMATION:  Selected notes from the MEDICAL RECORD NUMBER Referred by Dr. Ellie Lunch for eval of ERM OU   CURRENT MEDICATIONS: Current Outpatient Medications (Ophthalmic Drugs)  Medication Sig   Polyethyl Glycol-Propyl Glycol (SYSTANE) 0.4-0.3 % SOLN Place 1 drop into both eyes 2 (two) times daily.   prednisoLONE acetate (PRED FORTE) 1 % ophthalmic suspension PLACE 1 DROP INTO LEFT EYE FOUR TIMES A DAY  *WAIT 3-5 MINUTES BETWEEN 2 EYE MEDS* *SHAKE WELL* *STORE UPRIGHT*   Bromfenac Sodium (PROLENSA) 0.07 % SOLN Place 1 drop into the left eye 4 (four) times daily.   PROLENSA 0.07 % SOLN PLACE 1 DROP INTO THE LEFT EYE 4 (FOUR) TIMES DAILY. (Patient not taking: Reported on 12/27/2021)   No current facility-administered medications for this visit. (Ophthalmic Drugs)   Current Outpatient Medications (Other)  Medication Sig   acetaminophen (TYLENOL) 500 MG tablet  Take 500 mg by mouth every 8 (eight) hours as needed for mild pain or headache.   Biotin 5000 MCG TABS Take 1 tablet by mouth daily.   cetirizine (ZYRTEC) 10 MG tablet Take 10 mg by mouth daily.   Cholecalciferol (VITAMIN D3) 1.25 MG (50000 UT) TABS Take 1 tablet by mouth daily.   diclofenac Sodium (VOLTAREN) 1 % GEL Apply topically.   furosemide (LASIX) 20 MG tablet Take 0.5 tablets (10 mg total) by mouth daily.   ibuprofen (ADVIL) 200 MG tablet Take 200 mg by mouth as needed.   MEGARED OMEGA-3 KRILL OIL PO Take 1 tablet by mouth daily.   metFORMIN (GLUCOPHAGE) 1000 MG tablet Take 500 mg by mouth 2 (two) times daily.   metoprolol succinate (TOPROL XL) 25 MG 24 hr tablet Take 0.5 tablets (12.5 mg total) by mouth daily.   Multiple Vitamins-Minerals (CENTRUM SILVER ADULT 50+) TABS Take 1 tablet by mouth daily.   pantoprazole (PROTONIX) 40 MG tablet Take 40 mg by mouth daily.   sodium chloride (OCEAN) 0.65 % SOLN nasal spray Place 1 spray into both nostrils as needed for congestion.   triamcinolone acetonide (KENALOG-40) 40 MG/ML injection Inject 2 mLs into the muscle once.   ALPRAZolam (XANAX) 0.5 MG tablet Take 1-2 tablets by mouth as needed for anxiety (Patient not taking: Reported on 06/24/2021)  amoxicillin-clavulanate (AUGMENTIN) 500-125 MG tablet Take 1 tablet by mouth 2 (two) times daily. (Patient not taking: Reported on 11/03/2021)   benazepril (LOTENSIN) 20 MG tablet Take 20 mg by mouth daily. (Patient not taking: Reported on 06/24/2021)   Blood Glucose Monitoring Suppl (FREESTYLE LITE) DEVI    fluticasone (FLONASE) 50 MCG/ACT nasal spray Place 1 spray into both nostrils daily. (Patient not taking: Reported on 11/15/2021)   gabapentin (NEURONTIN) 100 MG capsule Take 100 mg by mouth at bedtime. (Patient not taking: Reported on 11/03/2021)   glucose blood (FREESTYLE LITE) test strip    metFORMIN (GLUCOPHAGE) 500 MG tablet Take 500 mg by mouth 2 (two) times daily.   No current  facility-administered medications for this visit. (Other)   REVIEW OF SYSTEMS: ROS   Positive for: Endocrine, Cardiovascular, Eyes Negative for: Constitutional, Gastrointestinal, Neurological, Skin, Genitourinary, Musculoskeletal, HENT, Respiratory, Psychiatric, Allergic/Imm, Heme/Lymph Last edited by Leonie Douglas, COA on 12/27/2021  9:38 AM.       ALLERGIES Allergies  Allergen Reactions   Atorvastatin Other (See Comments)    UNKNOWN   Other Other (See Comments)    Cramps to all Cholesterol medications   Statins Other (See Comments)    "cramps" UNKNOWN   PAST MEDICAL HISTORY Past Medical History:  Diagnosis Date   Anxiety    Coronary artery disease    Diabetes mellitus without complication ([place])    GERD (gastroesophageal reflux disease)    Hypertension    Renal disorder    stage 3   Past Surgical History:  Procedure Laterality Date   CATARACT EXTRACTION Bilateral    K[place]msara   CHOLECYSTECTOMY N/A 03/02/2018   Procedure: LAPAROSCOPIC CHOLECYSTECTOMY;  Surgeon: Greer Pickerel, MD;  Location: [facility];  Service: General;  Laterality: N/A;   ERCP N/A 06/26/2019   Procedure: ENDOSCOPIC RETROGRADE CHOLANGIOPANCREATOGRAPHY (ERCP);  Surgeon: [name], MD;  Location: [facility];  Service: Gastroenterology;  Laterality: N/A;   SPHINCTEROTOMY  06/26/2019   Procedure: SPHINCTEROTOMY;  Surgeon: [name], MD;  Location: [facility] ENDOSCOPY;  Service: Gastroenterology;;   FAMILY HISTORY Family History  Problem Relation Age of Onset   Heart failure Mother    Diabetes Daughter    SOCIAL HISTORY Social History   Tobacco Use   Smoking status: Never   Smokeless tobacco: Never  Vaping Use   Vaping Use: Never used  Substance Use Topics   Alcohol use: Not Currently   Drug use: Never       OPHTHALMIC EXAM: Base Eye Exam     Visual Acuity (Snellen - Linear)       Right Left   Dist cc 20/40 20/150    Correction: Glasses  Patient left her glasses at home. I put Rx in  phoropter.  OS patient states letters are really jumping around!        Tonometry (Tonopen, 9:52 AM)       Right Left   Pressure 14 19         Pupils       Dark Light Shape React APD   Right 2 1 Round Brisk None   Left 2 1 Round Brisk None         Visual Fields (Counting fingers)       Left Right    Full Full         Extraocular Movement       Right Left    Full Full         Neuro/Psych     Oriented x3:  Yes   Mood/Affect: Normal         Dilation     Both eyes: 1.0% Mydriacyl, 2.5% Phenylephrine @ 9:53 AM           Slit Lamp and Fundus Exam     Slit Lamp Exam       Right Left   Lids/Lashes Dermatochalasis - upper lid, mild MGD Dermatochalasis - upper lid, mild MGD   Conjunctiva/Sclera White and quiet White and quiet, STK ST quad   Cornea trace PEE, well healed cataract wound, mild arcus trace fine PEE, well healed cataract wound   Anterior Chamber Deep and quiet deep and clear, no cell or flare   Iris Round and moderately dilated Round and moderately dilated, focal atrophy and TID at 0300   Lens PC IOL in good position with open PC PC IOL in good position with open PC   Anterior Vitreous Vitreous syneresis Vitreous syneresis, Posterior vitreous detachment         Fundus Exam       Right Left   Disc mild Pallor, Sharp rim mild Pallor, mild temporal PPA, mild cupping   C/D Ratio 0.5 0.65   Macula Flat, Blunted foveal reflex, mild ERM, RPE mottling, No heme or edema Flat, Blunted foveal reflex, ERM with +cystic changes - persistent, no heme   Vessels attenuated, Tortuous attenuated, Tortuous   Periphery Attached, blonde fundus Attached, blonde fundus, focal IRH superior midzone           Refraction     Wearing Rx       Sphere Cylinder Axis   Right +0.75 Sphere    Left -1.00 +1.00 022  Patient doesn't have glasses.  I wonder if the Right lens should be -0.75.        Manifest Refraction       Sphere Cylinder Axis Dist VA    Right -0.75 +0.50 165 20/30   Left -0.75 +1.00 025 20/100+2            IMAGING AND PROCEDURES  Imaging and Procedures for 12/27/2021  OCT, Retina - OU - Both Eyes       Right Eye Quality was good. Central Foveal Thickness: 390. Progression has worsened. Findings include epiretinal membrane, no IRF, no SRF, vitreomacular adhesion , abnormal foveal contour, retinal drusen (ERM with Blunted foveal contour and interval central thickening ).   Left Eye Quality was good. Central Foveal Thickness: 437. Progression has worsened. Findings include abnormal foveal contour, intraretinal fluid, epiretinal membrane, vitreomacular adhesion , no SRF, retinal drusen (Mild interval increase in IRF/central cystic changes, ERM -- ?interval thickening).   Notes *Images captured and stored on drive  Diagnosis / Impression:  ERM OU  OD: ERM with Blunted foveal contour and interval central thickening  OS: Mild interval increase in IRF/central cystic changes, ERM -- ?interval thickening  Clinical management:  See below  Abbreviations: NFP - Normal foveal profile. CME - cystoid macular edema. PED - pigment epithelial detachment. IRF - intraretinal fluid. SRF - subretinal fluid. EZ - ellipsoid zone. ERM - epiretinal membrane. ORA - outer retinal atrophy. ORT - outer retinal tubulation. SRHM - subretinal hyper-reflective material. IRHM - intraretinal hyper-reflective material     \       ASSESSMENT/PLAN:    ICD-10-CM   1. Epiretinal membrane (ERM) of both eyes  H35.373 OCT, Retina - OU - Both Eyes    2. Cystoid macular edema of left eye  H35.352 OCT, Retina - OU - Both  Eyes    3. Essential hypertension  I10     4. Hypertensive retinopathy of both eyes  H35.033     5. Pseudophakia, both eyes  Z96.1      1,2. Epiretinal membrane, both eyes  - +ERM w/ blunting of foveal contour OU -- OS with +IRF / cystic changes (CME component) - FA (11.01.22) shows mild perifoveal staining / leakage OS  suggestive of CME component - started on PF and Prolensa QID OS on 11.1.22 - s/p STK OS #1 (12.20.22) - BCVA worse OU -- OD: 20/30; OS: 20/100 from 20/60 - OCT shows OD: ERM with Blunted foveal contour and interval central thickening, OS: Mild interval increase in IRF/central cystic changes, ERM -- ?interval thickening - continue PF and restart Prolensa QID OS - STK consent signed 12.20.22  - will hold off on ERM surgery until CME is fully addressed, but did discuss ERM surgery in great detail again -- pt wishes to hold off for now as well - f/u 5-6 weeks -- DFE/OCT  3,4. Hypertensive retinopathy OU - discussed importance of tight BP control - monitor  5. Pseudophakia OU  - s/p CE/IOL (Dr. Vilinda Blanks)  - IOLs in good position, doing well  - monitor  Ophthalmic Meds Ordered this visit:  No orders of the defined types were placed in this encounter.    Return for 5-6 weeks , DFE, OCT.  There are no Patient Instructions on file for this visit.  Explained the diagnoses, plan, and follow up with the patient and they expressed understanding.  Patient expressed understanding of the importance of proper follow up care.   This document serves as a record of services personally performed by Gardiner Sleeper, MD, PhD. It was created on their behalf by San Jetty. Owens Shark, OA an ophthalmic technician. The creation of this record is the provider's dictation and/or activities during the visit.    Electronically signed by: San Jetty. Owens Shark, [place] 02.20.2023 12:12 AM  Gardiner Sleeper, M.D., Ph.D. Diseases & Surgery of the Retina and Vitreous [facility]  I have reviewed the above documentation for accuracy and completeness, and I agree with the above. Gardiner Sleeper, M.D., Ph.D. 12/29/21 12:20 AM   Abbreviations: M myopia (nearsighted); A astigmatism; H hyperopia (farsighted); P presbyopia; Mrx spectacle prescription;  CTL contact lenses; OD right eye; OS left eye; OU both eyes   XT exotropia; ET esotropia; PEK punctate epithelial keratitis; PEE punctate epithelial erosions; DES dry eye syndrome; MGD meibomian gland dysfunction; ATs artificial tears; PFAT's preservative free artificial tears; [place] nuclear sclerotic cataract; PSC posterior subcapsular cataract; ERM epi-retinal membrane; PVD posterior vitreous detachment; RD retinal detachment; DM diabetes mellitus; DR diabetic retinopathy; NPDR non-proliferative diabetic retinopathy; PDR proliferative diabetic retinopathy; CSME clinically significant macular edema; DME diabetic macular edema; dbh dot blot hemorrhages; CWS cotton wool spot; POAG primary open angle glaucoma; C/D cup-to-disc ratio; HVF humphrey visual field; GVF goldmann visual field; OCT optical coherence tomography; IOP intraocular pressure; BRVO Branch retinal vein occlusion; CRVO central retinal vein occlusion; CRAO central retinal artery occlusion; BRAO branch retinal artery occlusion; RT retinal tear; SB scleral buckle; PPV pars plana vitrectomy; VH Vitreous hemorrhage; PRP panretinal laser photocoagulation; IVK intravitreal kenalog; VMT vitreomacular traction; MH Macular hole;  NVD neovascularization of the disc; NVE neovascularization elsewhere; AREDS age related eye disease study; ARMD age related macular degeneration; POAG primary open angle glaucoma; EBMD epithelial/anterior basement membrane dystrophy; ACIOL anterior chamber intraocular lens; IOL intraocular lens; PCIOL posterior chamber  intraocular lens; Phaco/IOL phacoemulsification with intraocular lens placement; [place] photorefractive keratectomy; LASIK laser assisted in situ keratomileusis; HTN hypertension; DM diabetes mellitus; COPD chronic obstructive pulmonary disease

## 2021-12-27 ENCOUNTER — Encounter (INDEPENDENT_AMBULATORY_CARE_PROVIDER_SITE_OTHER): Payer: Self-pay | Admitting: Ophthalmology

## 2021-12-27 ENCOUNTER — Ambulatory Visit (INDEPENDENT_AMBULATORY_CARE_PROVIDER_SITE_OTHER): Payer: Medicare Other | Admitting: Ophthalmology

## 2021-12-27 ENCOUNTER — Other Ambulatory Visit: Payer: Self-pay

## 2021-12-27 DIAGNOSIS — H35033 Hypertensive retinopathy, bilateral: Secondary | ICD-10-CM

## 2021-12-27 DIAGNOSIS — H35352 Cystoid macular degeneration, left eye: Secondary | ICD-10-CM | POA: Diagnosis not present

## 2021-12-27 DIAGNOSIS — Z961 Presence of intraocular lens: Secondary | ICD-10-CM | POA: Diagnosis not present

## 2021-12-27 DIAGNOSIS — I10 Essential (primary) hypertension: Secondary | ICD-10-CM | POA: Diagnosis not present

## 2021-12-27 DIAGNOSIS — H35373 Puckering of macula, bilateral: Secondary | ICD-10-CM | POA: Diagnosis not present

## 2021-12-29 ENCOUNTER — Encounter (INDEPENDENT_AMBULATORY_CARE_PROVIDER_SITE_OTHER): Payer: Self-pay | Admitting: Ophthalmology

## 2022-01-02 DIAGNOSIS — R062 Wheezing: Secondary | ICD-10-CM | POA: Diagnosis not present

## 2022-01-02 DIAGNOSIS — E1165 Type 2 diabetes mellitus with hyperglycemia: Secondary | ICD-10-CM | POA: Diagnosis not present

## 2022-01-02 DIAGNOSIS — J209 Acute bronchitis, unspecified: Secondary | ICD-10-CM | POA: Diagnosis not present

## 2022-01-02 DIAGNOSIS — R0602 Shortness of breath: Secondary | ICD-10-CM | POA: Diagnosis not present

## 2022-01-02 DIAGNOSIS — R5381 Other malaise: Secondary | ICD-10-CM | POA: Diagnosis not present

## 2022-01-02 DIAGNOSIS — R051 Acute cough: Secondary | ICD-10-CM | POA: Diagnosis not present

## 2022-01-03 DIAGNOSIS — R0602 Shortness of breath: Secondary | ICD-10-CM | POA: Diagnosis not present

## 2022-01-09 DIAGNOSIS — J209 Acute bronchitis, unspecified: Secondary | ICD-10-CM | POA: Diagnosis not present

## 2022-01-09 DIAGNOSIS — R0602 Shortness of breath: Secondary | ICD-10-CM | POA: Diagnosis not present

## 2022-01-09 DIAGNOSIS — E1165 Type 2 diabetes mellitus with hyperglycemia: Secondary | ICD-10-CM | POA: Diagnosis not present

## 2022-01-09 DIAGNOSIS — R5381 Other malaise: Secondary | ICD-10-CM | POA: Diagnosis not present

## 2022-01-27 DIAGNOSIS — K529 Noninfective gastroenteritis and colitis, unspecified: Secondary | ICD-10-CM | POA: Diagnosis not present

## 2022-01-30 NOTE — Progress Notes (Signed)
?[facility] Note ? ?01/31/2022 ? ?  ? ?CHIEF COMPLAINT ?Patient presents for Retina Follow Up ? ?HISTORY OF PRESENT ILLNESS: ?Nancy Webster is a 88 y.o. female who presents to the clinic today for:  ? ?HPI   ? ? Retina Follow Up   ?Patient presents with  Other.  In both eyes.  Duration of 5 weeks.  Since onset it is stable.  I, the attending physician,  performed the HPI with the patient and updated documentation appropriately. ? ?  ?  ? ? Comments   ?Having a discomfort in the OD.  A lot of tearing OS. Most all the time her vision will blurry up or fog up then clear up.  She is still using her magnifying glass when reading.  Eyes feel "rough" when she moves them around.   ?Using Prednisolone and Prolensa QID OS.  She uses Systane OD only.  ?She had a little pneumonia since her last visit.  Doing well now.  ?BS 164 this morning, A1C unsure, she thinks around 7 ? ?  ?  ?Last edited by Bernarda Caffey, MD on 02/02/2022  2:12 AM.  ?  ?Pt states she discussed sx with her family, she also showed them the brochure for [employer], she states she also studied the brochure as well, she states her family and her decided not to have the sx, but she wants to know if she can talk to someone who has had the sx previously, she is using PF and Prolensa QID OS, she states her eyes feel "rough" ? ?Referring physician: ?Lajean Manes, MD ?[address] ?[address] ?[place],  [place] [address] ? ?HISTORICAL INFORMATION:  ?Selected notes from the [facility] ?Referred by Dr. Ellie Lunch for eval of ERM OU  ? ?CURRENT MEDICATIONS: ?Current Outpatient Medications (Ophthalmic Drugs)  ?Medication Sig  ? Bromfenac Sodium (PROLENSA) 0.07 % SOLN Place 1 drop into the left eye 4 (four) times daily.  ? Polyethyl Glycol-Propyl Glycol (SYSTANE) 0.4-0.3 % SOLN Place 1 drop into both eyes 2 (two) times daily.  ? prednisoLONE acetate (PRED FORTE) 1 % ophthalmic suspension PLACE 1 DROP INTO LEFT EYE FOUR TIMES A DAY   *WAIT 3-5 MINUTES BETWEEN 2 EYE MEDS* *SHAKE WELL* *STORE UPRIGHT*  ? PROLENSA 0.07 % SOLN PLACE 1 DROP INTO THE LEFT EYE 4 (FOUR) TIMES DAILY.  ? ?No current facility-administered medications for this visit. (Ophthalmic Drugs)  ? ?Current Outpatient Medications (Other)  ?Medication Sig  ? acetaminophen (TYLENOL) 500 MG tablet Take 500 mg by mouth every 8 (eight) hours as needed for mild pain or headache.  ? Biotin 5000 MCG TABS Take 1 tablet by mouth daily.  ? cetirizine (ZYRTEC) 10 MG tablet Take 10 mg by mouth daily.  ? Cholecalciferol (VITAMIN D3) 1.25 MG (50000 UT) TABS Take 1 tablet by mouth daily.  ? diclofenac Sodium (VOLTAREN) 1 % GEL Apply topically.  ? furosemide (LASIX) 20 MG tablet Take 0.5 tablets (10 mg total) by mouth daily.  ? ibuprofen (ADVIL) 200 MG tablet Take 200 mg by mouth as needed.  ? MEGARED OMEGA-3 KRILL OIL PO Take 1 tablet by mouth daily.  ? metFORMIN (GLUCOPHAGE) 1000 MG tablet Take 500 mg by mouth 2 (two) times daily.  ? metFORMIN (GLUCOPHAGE) 500 MG tablet Take 500 mg by mouth 2 (two) times daily.  ? metoprolol succinate (TOPROL XL) 25 MG 24 hr tablet Take 0.5 tablets (12.5 mg total) by mouth daily.  ?  Multiple Vitamins-Minerals (CENTRUM SILVER ADULT 50+) TABS Take 1 tablet by mouth daily.  ? pantoprazole (PROTONIX) 40 MG tablet Take 40 mg by mouth daily.  ? sodium chloride (OCEAN) 0.65 % SOLN nasal spray Place 1 spray into both nostrils as needed for congestion.  ? triamcinolone acetonide (KENALOG-40) 40 MG/ML injection Inject 2 mLs into the muscle once.  ? ALPRAZolam (XANAX) 0.5 MG tablet Take 1-2 tablets by mouth as needed for anxiety (Patient not taking: Reported on 06/24/2021)  ? amoxicillin-clavulanate (AUGMENTIN) 500-125 MG tablet Take 1 tablet by mouth 2 (two) times daily. (Patient not taking: Reported on 11/03/2021)  ? benazepril (LOTENSIN) 20 MG tablet Take 20 mg by mouth daily. (Patient not taking: Reported on 06/24/2021)  ? Blood Glucose Monitoring Suppl (FREESTYLE LITE) DEVI    ? fluticasone (FLONASE) 50 MCG/ACT nasal spray Place 1 spray into both nostrils daily. (Patient not taking: Reported on 11/15/2021)  ? gabapentin (NEURONTIN) 100 MG capsule Take 100 mg by mouth at bedtime. (Patient not taking: Reported on 11/03/2021)  ? glucose blood (FREESTYLE LITE) test strip   ? ?No current facility-administered medications for this visit. (Other)  ? ?REVIEW OF SYSTEMS: ?ROS   ?Positive for: Endocrine, Cardiovascular, Eyes ?Negative for: Constitutional, Gastrointestinal, Neurological, Skin, Genitourinary, Musculoskeletal, HENT, Respiratory, Psychiatric, Allergic/Imm, Heme/Lymph ?Last edited by Leonie Douglas, COA on 01/31/2022  9:24 AM.  ?  ? ?ALLERGIES ?Allergies  ?Allergen Reactions  ? Atorvastatin Other (See Comments)  ?  UNKNOWN  ? Other Other (See Comments)  ?  Cramps to all Cholesterol medications  ? Statins Other (See Comments)  ?  "cramps" ?UNKNOWN  ? ?PAST MEDICAL HISTORY ?Past Medical History:  ?Diagnosis Date  ? Anxiety   ? Coronary artery disease   ? Diabetes mellitus without complication ([place])   ? GERD (gastroesophageal reflux disease)   ? Hypertension   ? Renal disorder   ? stage 3  ? ?Past Surgical History:  ?Procedure Laterality Date  ? CATARACT EXTRACTION Bilateral   ? [place]  ? CHOLECYSTECTOMY N/A 03/02/2018  ? Procedure: LAPAROSCOPIC CHOLECYSTECTOMY;  Surgeon: Greer Pickerel, MD;  Location: [facility];  Service: General;  Laterality: N/A;  ? ERCP N/A 06/26/2019  ? Procedure: ENDOSCOPIC RETROGRADE CHOLANGIOPANCREATOGRAPHY (ERCP);  Surgeon: [name], MD;  Location: [facility];  Service: Gastroenterology;  Laterality: N/A;  ? SPHINCTEROTOMY  06/26/2019  ? Procedure: SPHINCTEROTOMY;  Surgeon: [name], MD;  Location: [facility];  Service: Gastroenterology;;  ? ?FAMILY HISTORY ?Family History  ?Problem Relation Age of Onset  ? Heart failure Mother   ? Diabetes Daughter   ? ?SOCIAL HISTORY ?Social History  ? ?Tobacco Use  ? Smoking status: Never  ? Smokeless tobacco: Never  ?Vaping  Use  ? Vaping Use: Never used  ?Substance Use Topics  ? Alcohol use: Not Currently  ? Drug use: Never  ?  ? ?  ?OPHTHALMIC EXAM: ?Base Eye Exam   ? ? Visual Acuity (Snellen - Linear)   ? ?   Right Left  ? Dist cc 20/30 -2 20/150  ? Dist ph cc NI NI  ?Looking to the left OS ? ?  ?  ? ? Tonometry (Tonopen, 9:35 AM)   ? ?   Right Left  ? Pressure 14 20  ? ?  ?  ? ? Pupils   ? ?   Dark Light Shape React APD  ? Right 2 1 Round Slow None  ? Left 2 1 Round Slow None  ? ?  ?  ? ?  Visual Fields (Counting fingers)   ? ?   Left Right  ?  Full Full  ? ?  ?  ? ? Extraocular Movement   ? ?   Right Left  ?  Full Full  ? ?  ?  ? ? Neuro/Psych   ? ? Oriented x3: Yes  ? Mood/Affect: Normal  ? ?  ?  ? ? Dilation   ? ? Both eyes: 1.0% Mydriacyl, 2.5% Phenylephrine @ 9:35 AM  ? ?  ?  ? ?  ? ?Slit Lamp and Fundus Exam   ? ? Slit Lamp Exam   ? ?   Right Left  ? Lids/Lashes Dermatochalasis - upper lid, mild MGD Dermatochalasis - upper lid, mild MGD  ? Conjunctiva/Sclera White and quiet White and quiet, STK ST quad  ? Cornea trace PEE, well healed cataract wound, mild arcus, trace EBMD 1+PEE, well healed cataract wound  ? Anterior Chamber Deep and quiet deep and clear, no cell or flare  ? Iris Round and moderately dilated Round and moderately dilated, focal atrophy and TID at 0300  ? Lens PC IOL in good position with open PC PC IOL in good position with open PC  ? Anterior Vitreous Vitreous syneresis Vitreous syneresis, Posterior vitreous detachment  ? ?  ?  ? ? Fundus Exam   ? ?   Right Left  ? Disc mild Pallor, Sharp rim mild Pallor, mild temporal PPA, mild cupping  ? C/D Ratio 0.5 0.65  ? Macula Flat, Blunted foveal reflex, mild ERM, RPE mottling, No heme or edema Flat, Blunted foveal reflex, ERM with +cystic changes - persistent, no heme  ? Vessels attenuated, Tortuous attenuated, Tortuous  ? Periphery Attached, blonde fundus Attached, blonde fundus, scattered MA/IRH superior midzone  ? ?  ?  ? ?  ? ?Refraction   ? ? Wearing Rx   ? ?    Sphere Cylinder Axis  ? Right +0.75 Sphere   ? Left -1.00 +1.00 022  ? ?  ?  ? ?  ? ? ?IMAGING AND PROCEDURES  ?Imaging and Procedures for 01/31/2022 ? ?OCT, Retina - OU - Both Eyes   ? ?   ?Right Eye

## 2022-01-31 ENCOUNTER — Encounter (INDEPENDENT_AMBULATORY_CARE_PROVIDER_SITE_OTHER): Payer: Self-pay | Admitting: Ophthalmology

## 2022-01-31 ENCOUNTER — Ambulatory Visit (INDEPENDENT_AMBULATORY_CARE_PROVIDER_SITE_OTHER): Payer: Medicare Other | Admitting: Ophthalmology

## 2022-01-31 DIAGNOSIS — H35373 Puckering of macula, bilateral: Secondary | ICD-10-CM | POA: Diagnosis not present

## 2022-01-31 DIAGNOSIS — I10 Essential (primary) hypertension: Secondary | ICD-10-CM | POA: Diagnosis not present

## 2022-01-31 DIAGNOSIS — Z961 Presence of intraocular lens: Secondary | ICD-10-CM

## 2022-01-31 DIAGNOSIS — H35352 Cystoid macular degeneration, left eye: Secondary | ICD-10-CM

## 2022-01-31 DIAGNOSIS — H35033 Hypertensive retinopathy, bilateral: Secondary | ICD-10-CM | POA: Diagnosis not present

## 2022-02-02 ENCOUNTER — Encounter (INDEPENDENT_AMBULATORY_CARE_PROVIDER_SITE_OTHER): Payer: Self-pay | Admitting: Ophthalmology

## 2022-02-08 ENCOUNTER — Telehealth (INDEPENDENT_AMBULATORY_CARE_PROVIDER_SITE_OTHER): Payer: Self-pay

## 2022-02-08 NOTE — Telephone Encounter (Addendum)
I spoke with Mrs. Nancy Webster about a problem her daughter called about.  Patient was walking to her room at her facility and 1/2 of her vision went out.  She states it stayed that way for less than 5 minutes.  Once she regained vision, it appeared blurry.  Vision is getting better now.   ?Dr. Coralyn Pear wanted a nurse to check on patient to make sure she did not have a TIA.   ?Mrs. Nancy Webster's nurse Merry Lofty has been notified and is going to check the patient out. ? ?Merry Lofty, RN called with an update.  Mrs. Nancy Webster has been doing ok.  She is going to get in touch with her PCP at [facility].   ? ?

## 2022-02-09 ENCOUNTER — Other Ambulatory Visit: Payer: Self-pay | Admitting: Internal Medicine

## 2022-02-23 ENCOUNTER — Other Ambulatory Visit (INDEPENDENT_AMBULATORY_CARE_PROVIDER_SITE_OTHER): Payer: Self-pay | Admitting: Ophthalmology

## 2022-02-27 ENCOUNTER — Other Ambulatory Visit (INDEPENDENT_AMBULATORY_CARE_PROVIDER_SITE_OTHER): Payer: Self-pay

## 2022-02-27 ENCOUNTER — Other Ambulatory Visit (INDEPENDENT_AMBULATORY_CARE_PROVIDER_SITE_OTHER): Payer: Self-pay | Admitting: Ophthalmology

## 2022-02-27 MED ORDER — PROLENSA 0.07 % OP SOLN: 1.0000 [drp] | Freq: Four times a day (QID) | OPHTHALMIC | 5 refills | Status: DC

## 2022-02-28 ENCOUNTER — Other Ambulatory Visit (INDEPENDENT_AMBULATORY_CARE_PROVIDER_SITE_OTHER): Payer: Self-pay

## 2022-02-28 DIAGNOSIS — K219 Gastro-esophageal reflux disease without esophagitis: Secondary | ICD-10-CM | POA: Diagnosis not present

## 2022-02-28 DIAGNOSIS — I5032 Chronic diastolic (congestive) heart failure: Secondary | ICD-10-CM | POA: Diagnosis not present

## 2022-02-28 DIAGNOSIS — E1169 Type 2 diabetes mellitus with other specified complication: Secondary | ICD-10-CM | POA: Diagnosis not present

## 2022-02-28 DIAGNOSIS — R2689 Other abnormalities of gait and mobility: Secondary | ICD-10-CM | POA: Diagnosis not present

## 2022-02-28 MED ORDER — PROLENSA 0.07 % OP SOLN: 1.0000 [drp] | Freq: Four times a day (QID) | OPHTHALMIC | 5 refills | Status: DC

## 2022-03-15 DIAGNOSIS — I5032 Chronic diastolic (congestive) heart failure: Secondary | ICD-10-CM | POA: Diagnosis not present

## 2022-03-15 DIAGNOSIS — E1169 Type 2 diabetes mellitus with other specified complication: Secondary | ICD-10-CM | POA: Diagnosis not present

## 2022-03-15 DIAGNOSIS — R252 Cramp and spasm: Secondary | ICD-10-CM | POA: Diagnosis not present

## 2022-03-15 DIAGNOSIS — R2689 Other abnormalities of gait and mobility: Secondary | ICD-10-CM | POA: Diagnosis not present

## 2022-03-15 DIAGNOSIS — E782 Mixed hyperlipidemia: Secondary | ICD-10-CM | POA: Diagnosis not present

## 2022-03-16 ENCOUNTER — Other Ambulatory Visit: Payer: Self-pay | Admitting: Internal Medicine

## 2022-03-16 DIAGNOSIS — R2689 Other abnormalities of gait and mobility: Secondary | ICD-10-CM

## 2022-03-22 NOTE — Progress Notes (Addendum)
[facility] Note  04/04/2022     CHIEF COMPLAINT Patient presents for Retina Follow Up  HISTORY OF PRESENT ILLNESS: Nancy Webster is a 88 y.o. female who presents to the clinic today for:   HPI     Retina Follow Up   Patient presents with  Other.  In both eyes.  This started months ago.  Duration of 2 months.  Since onset it is stable.  I, the attending physician,  performed the HPI with the patient and updated documentation appropriately.        Comments   Patient feels that the vision is not any better. She is complaining of itching in the left eye. And one night after she went to bed the right eye was sore and achy. She is doing the Prolensa OS QID. Her blood sugar was 170 and her A1C is 7.3.      Last edited by Bernarda Caffey, MD on 04/04/2022 12:57 PM.    Pt states she has been trying to keep up with the drops  Referring physician: Luberta Mutter, MD [place],  [place] [address]  HISTORICAL INFORMATION:  Selected notes from the MEDICAL RECORD NUMBER Referred by Dr. Ellie Lunch for eval of ERM OU   CURRENT MEDICATIONS: Current Outpatient Medications (Ophthalmic Drugs)  Medication Sig   Bromfenac Sodium (PROLENSA) 0.07 % SOLN Place 1 drop into the left eye 4 (four) times daily.   Polyethyl Glycol-Propyl Glycol (SYSTANE) 0.4-0.3 % SOLN Place 1 drop into both eyes 2 (two) times daily.   prednisoLONE acetate (PRED FORTE) 1 % ophthalmic suspension PLACE 1 DROP INTO LEFT EYE FOUR TIMES A DAY  *WAIT 3-5 MINUTES BETWEEN 2 EYE MEDS* *SHAKE WELL* *STORE UPRIGHT*   PROLENSA 0.07 % SOLN PLACE 1 DROP INTO THE LEFT EYE 4 (FOUR) TIMES DAILY.   No current facility-administered medications for this visit. (Ophthalmic Drugs)   Current Outpatient Medications (Other)  Medication Sig   acetaminophen (TYLENOL) 500 MG tablet Take 500 mg by mouth every 8 (eight) hours as needed for mild pain or headache.   Biotin 5000 MCG TABS Take 1 tablet by mouth  daily.   Blood Glucose Monitoring Suppl (FREESTYLE LITE) DEVI    cetirizine (ZYRTEC) 10 MG tablet Take 10 mg by mouth daily.   Cholecalciferol (VITAMIN D3) 1.25 MG (50000 UT) TABS Take 1 tablet by mouth daily.   diclofenac Sodium (VOLTAREN) 1 % GEL Apply topically.   fluticasone (FLONASE) 50 MCG/ACT nasal spray Place 1 spray into both nostrils daily.   furosemide (LASIX) 20 MG tablet TAKE 1/2 TABLET = 10 MG BY MOUTH ONCE DAILY   gabapentin (NEURONTIN) 100 MG capsule Take 100 mg by mouth at bedtime.   glucose blood (FREESTYLE LITE) test strip    ibuprofen (ADVIL) 200 MG tablet Take 200 mg by mouth as needed.   MEGARED OMEGA-3 KRILL OIL PO Take 1 tablet by mouth daily.   metFORMIN (GLUCOPHAGE) 1000 MG tablet Take 500 mg by mouth 2 (two) times daily.   metFORMIN (GLUCOPHAGE) 500 MG tablet Take 500 mg by mouth 2 (two) times daily.   metoprolol succinate (TOPROL XL) 25 MG 24 hr tablet Take 0.5 tablets (12.5 mg total) by mouth daily.   Multiple Vitamins-Minerals (CENTRUM SILVER ADULT 50+) TABS Take 1 tablet by mouth daily.   pantoprazole (PROTONIX) 40 MG tablet Take 40 mg by mouth daily.   sodium chloride (OCEAN) 0.65 % SOLN nasal spray Place 1 spray into  both nostrils as needed for congestion.   triamcinolone acetonide (KENALOG-40) 40 MG/ML injection Inject 2 mLs into the muscle once.   ALPRAZolam (XANAX) 0.5 MG tablet Take 1-2 tablets by mouth as needed for anxiety   amoxicillin-clavulanate (AUGMENTIN) 500-125 MG tablet Take 1 tablet by mouth 2 (two) times daily.   benazepril (LOTENSIN) 20 MG tablet Take 20 mg by mouth daily.   No current facility-administered medications for this visit. (Other)   REVIEW OF SYSTEMS: ROS   Positive for: Endocrine, Cardiovascular, Eyes Negative for: Constitutional, Gastrointestinal, Neurological, Skin, Genitourinary, Musculoskeletal, HENT, Respiratory, Psychiatric, Allergic/Imm, Heme/Lymph Last edited by Annie Paras, COT on 04/04/2022  9:23 AM.      ALLERGIES Allergies  Allergen Reactions   Atorvastatin Other (See Comments)    UNKNOWN   Other Other (See Comments)    Cramps to all Cholesterol medications   Statins Other (See Comments)    "cramps" UNKNOWN   PAST MEDICAL HISTORY Past Medical History:  Diagnosis Date   Anxiety    Coronary artery disease    Diabetes mellitus without complication ([place])    GERD (gastroesophageal reflux disease)    Hypertension    Renal disorder    stage 3   Past Surgical History:  Procedure Laterality Date   CATARACT EXTRACTION Bilateral    [place]   CHOLECYSTECTOMY N/A 03/02/2018   Procedure: LAPAROSCOPIC CHOLECYSTECTOMY;  Surgeon: Greer Pickerel, MD;  Location: [facility];  Service: General;  Laterality: N/A;   ERCP N/A 06/26/2019   Procedure: ENDOSCOPIC RETROGRADE CHOLANGIOPANCREATOGRAPHY (ERCP);  Surgeon: [name], MD;  Location: [facility];  Service: Gastroenterology;  Laterality: N/A;   SPHINCTEROTOMY  06/26/2019   Procedure: SPHINCTEROTOMY;  Surgeon: [name], MD;  Location: [facility] ENDOSCOPY;  Service: Gastroenterology;;   FAMILY HISTORY Family History  Problem Relation Age of Onset   Heart failure Mother    Diabetes Daughter    SOCIAL HISTORY Social History   Tobacco Use   Smoking status: Never   Smokeless tobacco: Never  Vaping Use   Vaping Use: Never used  Substance Use Topics   Alcohol use: Not Currently   Drug use: Never       OPHTHALMIC EXAM: Base Eye Exam     Visual Acuity (Snellen - Linear)       Right Left   Dist [place] 20/30 20/100 +1   Dist ph [place] NI NI         Tonometry (Tonopen, 9:31 AM)       Right Left   Pressure 15 19         Pupils       Pupils Dark Light Shape React APD   Right PERRL 2 1 Round Slow None   Left PERRL 2 1 Round Slow None         Visual Fields       Left Right    Full Full         Extraocular Movement       Right Left    Full, Ortho Full, Ortho         Neuro/Psych     Oriented x3: Yes   Mood/Affect:  Normal         Dilation     Both eyes: 2.5% Phenylephrine, 1.0% Mydriacyl @ 9:27 AM           Slit Lamp and Fundus Exam     Slit Lamp Exam       Right Left   Lids/Lashes Dermatochalasis - upper lid,  mild MGD Dermatochalasis - upper lid, mild MGD   Conjunctiva/Sclera White and quiet White and quiet, STK ST quad   Cornea trace PEE, well healed cataract wound, mild arcus, trace EBMD Trace PEE, well healed cataract wound, mild endo pigment superiorly   Anterior Chamber Deep and quiet deep and clear, no cell or flare   Iris Round and moderately dilated Round and moderately dilated, focal atrophy and TID at 0300   Lens PC IOL in good position with open PC PC IOL in good position with open PC   Anterior Vitreous Vitreous syneresis, Posterior vitreous detachment, vitreous condensations Vitreous syneresis, Posterior vitreous detachment         Fundus Exam       Right Left   Disc mild Pallor, Sharp rim mild Pallor, mild temporal PPA, mild cupping, disc heme at 0230, Sharp rim, tilted, +SVP   C/D Ratio 0.5 0.65   Macula Flat, Blunted foveal reflex, mild ERM, RPE mottling, No heme or edema Flat, Blunted foveal reflex, ERM with +cystic changes - improved, no heme   Vessels attenuated, Tortuous attenuated, Tortuous   Periphery Attached, blonde fundus Attached, blonde fundus, persistent MA/IRH superior midzone along superior venule - ?BRVO           Refraction     Wearing Rx       Sphere Cylinder Axis   Right +0.75 Sphere    Left -1.00 +1.00 022           IMAGING AND PROCEDURES  Imaging and Procedures for 04/04/2022  OCT, Retina - OU - Both Eyes       Right Eye Quality was good. Central Foveal Thickness: 390. Progression has been stable. Findings include epiretinal membrane, no IRF, no SRF, vitreomacular adhesion , abnormal foveal contour, retinal drusen (ERM with Blunted foveal contour and central thickening ).   Left Eye Quality was good. Central Foveal Thickness:  430. Progression has improved. Findings include abnormal foveal contour, intraretinal fluid, epiretinal membrane, vitreomacular adhesion , no SRF, retinal drusen (Mild interval improvement in central cystic changes, ERM with central thickening).   Notes *Images captured and stored on drive  Diagnosis / Impression:  ERM OU  OD: ERM with Blunted foveal contour and central thickening  OS: Mild interval improvement in central cystic changes, ERM with central thickening   Clinical management:  See below  Abbreviations: NFP - Normal foveal profile. CME - cystoid macular edema. PED - pigment epithelial detachment. IRF - intraretinal fluid. SRF - subretinal fluid. EZ - ellipsoid zone. ERM - epiretinal membrane. ORA - outer retinal atrophy. ORT - outer retinal tubulation. SRHM - subretinal hyper-reflective material. IRHM - intraretinal hyper-reflective material      Fluorescein Angiography Optos (Transit OS)       Right Eye Progression has been stable. Early phase findings include normal observations. Mid/Late phase findings include staining (Mild, late peripapillary staining).   Left Eye Progression has no prior data. Early phase findings include delayed filling (Delayed venous return of superior venule, punctate perivascular blockage along superior venule). Mid/Late phase findings include staining, blockage (No leakage, no CME).   Notes **Images stored on drive**  Impression: OD: Mild, late peripapillary staining OS: Delayed venous return of superior venule, punctate perivascular blockage along superior venule -- superior BRVO outside of macula            ASSESSMENT/PLAN:    ICD-10-CM   1. Epiretinal membrane (ERM) of both eyes  H35.373 OCT, Retina - OU - Both Eyes  2. Cystoid macular edema of left eye  H35.352     3. Essential hypertension  I10     4. Hypertensive retinopathy of both eyes  H35.033 Fluorescein Angiography Optos (Transit OS)    5. Branch retinal vein  occlusion of left eye with macular edema  H34.8320     6. Pseudophakia, both eyes  Z96.1      1,2. Epiretinal membrane, both eyes  - +ERM w/ blunting of foveal contour OU -- OS with +IRF / cystic changes - FA (11.01.22) shows mild perifoveal staining / leakage OS suggestive of CME component - started on PF and Prolensa QID OS on 11.1.22 - s/p STK OS #1 (12.20.22) - BCVA OD: 20/30; OS: 20/100 (improved) - OCT shows OD: ERM with Blunted foveal contour and central thickening; OS: Mild interval improvement in central cystic changes, ERM with central thickening  - FA (06.06.23) shows OD: Mild, late peripapillary staining; OS: Delayed venous return of superior venule, punctate perivascular blockage along superior venule -- superior BRVO outside of macula - continue PF and restart Prolensa QID OS - STK consent signed 12.20.22  - will hold off on ERM surgery until CME is fully addressed, but did discuss ERM surgery in great detail again -- pt wishes to hold off for now as well - f/u 4 weeks -- DFE/OCT  3,4. Hypertensive retinopathy OU  - BP in office 6.6.23 was 171/73 - discussed importance of tight BP control - monitor  5. BRVO OS - new onset retinal hemorrhages along superior venule, superior to disc - OCT shows ERM w/ cystic changes - ?minimal contribution from BRVO - FA 6.6.23 shows delayed venous return through superior venule suggestive of BRVO - pt also reports history of recent TIA-like episode (telephone note on 4.12.23) -- pt experienced transient visual field loss and blurred vision that resolved over several minutes  - BP 171/73 - all of these factors raise concern / increased risk of possible cardiovascular compromise, I.e. CVA, MI, etc. - discussed findings and case with pt's PCP -- Dr. 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 Felipa Eth to schedule pt back for early f/u and further evaluation -- appreciate his expertise - no retinal or ophthalmic interventions indicated or recommended at this time -- if hemorrhages worsen, may benefit from intravitreal anti-VEGF therapy - f/u in 4 wks, sooner prn -- DFE/OCT  6. Pseudophakia OU  - s/p CE/IOL (Dr. Vilinda Blanks)  - IOLs in good position, doing well  - monitor  Ophthalmic Meds Ordered this visit:  No orders of the defined types were placed in this encounter.    Return in about 4 weeks (around 05/02/2022) for f/u ERM OU, DFE, OCT.  There are no Patient Instructions on file for this visit.  Explained the diagnoses, plan, and follow up with the patient and they expressed understanding.  Patient expressed understanding of the importance of proper follow up care.   This document serves as a record of services personally performed by Gardiner Sleeper, MD, PhD. It was created on their behalf by Bernarda Caffey, MD, an ophthalmic technician. The creation of this record is the provider's dictation and/or activities during the visit.    Electronically signed by: Bernarda Caffey, MD 03/22/22 5:16 PM  Aaron Edelman  Antony Haste, M.D., Ph.D. Diseases & Surgery of the Retina and Vitreous [facility]  I have reviewed the above documentation for accuracy and completeness, and I agree with the above. Gardiner Sleeper, M.D., Ph.D. 04/04/22 5:28 PM  Abbreviations: M myopia (nearsighted); A astigmatism; H hyperopia (farsighted); P presbyopia; Mrx spectacle prescription;  CTL contact lenses; OD right eye; OS left eye; OU both eyes  XT exotropia; ET esotropia; PEK punctate epithelial keratitis; PEE punctate epithelial erosions; DES dry eye syndrome; MGD meibomian gland dysfunction; ATs artificial tears; PFAT's preservative free artificial tears; [place] nuclear sclerotic cataract; PSC  posterior subcapsular cataract; ERM epi-retinal membrane; PVD posterior vitreous detachment; RD retinal detachment; DM diabetes mellitus; DR diabetic retinopathy; NPDR non-proliferative diabetic retinopathy; PDR proliferative diabetic retinopathy; CSME clinically significant macular edema; DME diabetic macular edema; dbh dot blot hemorrhages; CWS cotton wool spot; POAG primary open angle glaucoma; C/D cup-to-disc ratio; HVF humphrey visual field; GVF goldmann visual field; OCT optical coherence tomography; IOP intraocular pressure; BRVO Branch retinal vein occlusion; CRVO central retinal vein occlusion; CRAO central retinal artery occlusion; BRAO branch retinal artery occlusion; RT retinal tear; SB scleral buckle; PPV pars plana vitrectomy; VH Vitreous hemorrhage; PRP panretinal laser photocoagulation; IVK intravitreal kenalog; VMT vitreomacular traction; MH Macular hole;  NVD neovascularization of the disc; NVE neovascularization elsewhere; AREDS age related eye disease study; ARMD age related macular degeneration; POAG primary open angle glaucoma; EBMD epithelial/anterior basement membrane dystrophy; ACIOL anterior chamber intraocular lens; IOL intraocular lens; PCIOL posterior chamber intraocular lens; Phaco/IOL phacoemulsification with intraocular lens placement; [place] photorefractive keratectomy; LASIK laser assisted in situ keratomileusis; HTN hypertension; DM diabetes mellitus; COPD chronic obstructive pulmonary disease

## 2022-03-29 ENCOUNTER — Ambulatory Visit
Admission: RE | Admit: 2022-03-29 | Discharge: 2022-03-29 | Disposition: A | Discharge status: Home / Self Care | Payer: Medicare Other | Source: Ambulatory Visit | Attending: Internal Medicine | Admitting: Internal Medicine

## 2022-03-29 DIAGNOSIS — R200 Anesthesia of skin: Secondary | ICD-10-CM | POA: Diagnosis not present

## 2022-03-29 DIAGNOSIS — G500 Trigeminal neuralgia: Secondary | ICD-10-CM | POA: Diagnosis not present

## 2022-03-29 DIAGNOSIS — R2689 Other abnormalities of gait and mobility: Secondary | ICD-10-CM

## 2022-03-29 DIAGNOSIS — G9389 Other specified disorders of brain: Secondary | ICD-10-CM | POA: Diagnosis not present

## 2022-04-04 ENCOUNTER — Ambulatory Visit (INDEPENDENT_AMBULATORY_CARE_PROVIDER_SITE_OTHER): Payer: Medicare Other | Admitting: Ophthalmology

## 2022-04-04 ENCOUNTER — Encounter (INDEPENDENT_AMBULATORY_CARE_PROVIDER_SITE_OTHER): Payer: Self-pay | Admitting: Ophthalmology

## 2022-04-04 VITALS — BP 171/73 | HR 80

## 2022-04-04 DIAGNOSIS — I10 Essential (primary) hypertension: Secondary | ICD-10-CM

## 2022-04-04 DIAGNOSIS — H35352 Cystoid macular degeneration, left eye: Secondary | ICD-10-CM | POA: Diagnosis not present

## 2022-04-04 DIAGNOSIS — H35373 Puckering of macula, bilateral: Secondary | ICD-10-CM

## 2022-04-04 DIAGNOSIS — Z961 Presence of intraocular lens: Secondary | ICD-10-CM | POA: Diagnosis not present

## 2022-04-04 DIAGNOSIS — H35033 Hypertensive retinopathy, bilateral: Secondary | ICD-10-CM | POA: Diagnosis not present

## 2022-04-04 DIAGNOSIS — H34832 Tributary (branch) retinal vein occlusion, left eye, with macular edema: Secondary | ICD-10-CM | POA: Diagnosis not present

## 2022-04-13 DIAGNOSIS — E782 Mixed hyperlipidemia: Secondary | ICD-10-CM | POA: Diagnosis not present

## 2022-04-13 DIAGNOSIS — I5032 Chronic diastolic (congestive) heart failure: Secondary | ICD-10-CM | POA: Diagnosis not present

## 2022-04-13 DIAGNOSIS — E1169 Type 2 diabetes mellitus with other specified complication: Secondary | ICD-10-CM | POA: Diagnosis not present

## 2022-04-13 DIAGNOSIS — Z8673 Personal history of transient ischemic attack (TIA), and cerebral infarction without residual deficits: Secondary | ICD-10-CM | POA: Diagnosis not present

## 2022-05-03 NOTE — Progress Notes (Signed)
[facility] Note  05/05/2022     CHIEF COMPLAINT Patient presents for Retina Follow Up  HISTORY OF PRESENT ILLNESS: Nancy Webster is a 88 y.o. female who presents to the clinic today for:   HPI     Retina Follow Up   Patient presents with  Other.  In both eyes.  This started 4 weeks ago.  I, the attending physician,  performed the HPI with the patient and updated documentation appropriately.        Comments   Patient here for 4 weeks retina follow up for ERM OU BRVO OS. Patient states vision is poor. About the same. OS wants to cry. Farxiga and Repatha were added.      Last edited by Bernarda Caffey, MD on 05/07/2022  2:35 AM.     Pt states she has been trying to keep up with the drops. Patient states that the MRI was okay.    Dr. Reesa Chew is new PCP that comes to her facility at [facility]. Patient will call with physician information.   Referring physician: Lajean Manes, [place] ,  [place] [address]  HISTORICAL INFORMATION:  Selected notes from the MEDICAL RECORD NUMBER Referred by Dr. Ellie Lunch for eval of ERM OU   CURRENT MEDICATIONS: Current Outpatient Medications (Ophthalmic Drugs)  Medication Sig   Bromfenac Sodium (PROLENSA) 0.07 % SOLN Place 1 drop into the left eye 4 (four) times daily.   Polyethyl Glycol-Propyl Glycol (SYSTANE) 0.4-0.3 % SOLN Place 1 drop into both eyes 2 (two) times daily.   prednisoLONE acetate (PRED FORTE) 1 % ophthalmic suspension PLACE 1 DROP INTO LEFT EYE FOUR TIMES A DAY  *WAIT 3-5 MINUTES BETWEEN 2 EYE MEDS* *SHAKE WELL* *STORE UPRIGHT*   PROLENSA 0.07 % SOLN PLACE 1 DROP INTO THE LEFT EYE 4 (FOUR) TIMES DAILY.   No current facility-administered medications for this visit. (Ophthalmic Drugs)   Current Outpatient Medications (Other)  Medication Sig   acetaminophen (TYLENOL) 500 MG tablet Take 500 mg by mouth every 8 (eight) hours as needed for mild pain or headache.   ALPRAZolam  (XANAX) 0.5 MG tablet Take 1-2 tablets by mouth as needed for anxiety   amoxicillin-clavulanate (AUGMENTIN) 500-125 MG tablet Take 1 tablet by mouth 2 (two) times daily.   benazepril (LOTENSIN) 20 MG tablet Take 20 mg by mouth daily.   Biotin 5000 MCG TABS Take 1 tablet by mouth daily.   Blood Glucose Monitoring Suppl (FREESTYLE LITE) DEVI    cetirizine (ZYRTEC) 10 MG tablet Take 10 mg by mouth daily.   Cholecalciferol (VITAMIN D3) 1.25 MG (50000 UT) TABS Take 1 tablet by mouth daily.   diclofenac Sodium (VOLTAREN) 1 % GEL Apply topically.   fluticasone (FLONASE) 50 MCG/ACT nasal spray Place 1 spray into both nostrils daily.   furosemide (LASIX) 20 MG tablet TAKE 1/2 TABLET = 10 MG BY MOUTH ONCE DAILY   gabapentin (NEURONTIN) 100 MG capsule Take 100 mg by mouth at bedtime.   glucose blood (FREESTYLE LITE) test strip    ibuprofen (ADVIL) 200 MG tablet Take 200 mg by mouth as needed.   MEGARED OMEGA-3 KRILL OIL PO Take 1 tablet by mouth daily.   metFORMIN (GLUCOPHAGE) 1000 MG tablet Take 500 mg by mouth 2 (two) times daily.   metFORMIN (GLUCOPHAGE) 500 MG tablet Take 500 mg by mouth 2 (two) times daily.   metoprolol succinate (TOPROL XL) 25 MG 24 hr tablet Take 0.5 tablets (  12.5 mg total) by mouth daily.   Multiple Vitamins-Minerals (CENTRUM SILVER ADULT 50+) TABS Take 1 tablet by mouth daily.   pantoprazole (PROTONIX) 40 MG tablet Take 40 mg by mouth daily.   sodium chloride (OCEAN) 0.65 % SOLN nasal spray Place 1 spray into both nostrils as needed for congestion.   triamcinolone acetonide (KENALOG-40) 40 MG/ML injection Inject 2 mLs into the muscle once.   No current facility-administered medications for this visit. (Other)   REVIEW OF SYSTEMS: ROS   Positive for: Endocrine, Cardiovascular, Eyes Negative for: Constitutional, Gastrointestinal, Neurological, Skin, Genitourinary, Musculoskeletal, HENT, Respiratory, Psychiatric, Allergic/Imm, Heme/Lymph Last edited by Theodore Demark, COA  on 05/05/2022  9:46 AM.      ALLERGIES Allergies  Allergen Reactions   Atorvastatin Other (See Comments)    UNKNOWN   Other Other (See Comments)    Cramps to all Cholesterol medications   Statins Other (See Comments)    "cramps" UNKNOWN   PAST MEDICAL HISTORY Past Medical History:  Diagnosis Date   Anxiety    Coronary artery disease    Diabetes mellitus without complication ([place])    GERD (gastroesophageal reflux disease)    Hypertension    Renal disorder    stage 3   Past Surgical History:  Procedure Laterality Date   CATARACT EXTRACTION Bilateral    [place]   CHOLECYSTECTOMY N/A 03/02/2018   Procedure: LAPAROSCOPIC CHOLECYSTECTOMY;  Surgeon: Greer Pickerel, MD;  Location: [place];  Service: General;  Laterality: N/A;   ERCP N/A 06/26/2019   Procedure: ENDOSCOPIC RETROGRADE CHOLANGIOPANCREATOGRAPHY (ERCP);  Surgeon: [name], MD;  Location: [place];  Service: Gastroenterology;  Laterality: N/A;   SPHINCTEROTOMY  06/26/2019   Procedure: SPHINCTEROTOMY;  Surgeon: [name], MD;  Location: [facility] ENDOSCOPY;  Service: Gastroenterology;;   FAMILY HISTORY Family History  Problem Relation Age of Onset   Heart failure Mother    Diabetes Daughter    SOCIAL HISTORY Social History   Tobacco Use   Smoking status: Never   Smokeless tobacco: Never  Vaping Use   Vaping Use: Never used  Substance Use Topics   Alcohol use: Not Currently   Drug use: Never       OPHTHALMIC EXAM: Base Eye Exam     Visual Acuity (Snellen - Linear)       Right Left   Dist [place] 20/30 -2 20/100   Dist ph [place] NI NI         Tonometry (Tonopen, 9:43 AM)       Right Left   Pressure 15 19         Pupils       Dark Light Shape React APD   Right 2 1 Round Slow None   Left 2 1 Round Slow None         Visual Fields (Counting fingers)       Left Right    Full Full         Extraocular Movement       Right Left    Full, Ortho Full, Ortho         Neuro/Psych      Oriented x3: Yes   Mood/Affect: Normal         Dilation     Both eyes: 1.0% Mydriacyl, 2.5% Phenylephrine @ 9:43 AM           Slit Lamp and Fundus Exam     Slit Lamp Exam       Right Left   Lids/Lashes Dermatochalasis -  upper lid, mild MGD Dermatochalasis - upper lid, mild MGD   Conjunctiva/Sclera White and quiet White and quiet, STK ST quad   Cornea trace PEE, well healed cataract wound, mild arcus, trace EBMD Trace PEE, well healed cataract wound, mild endo pigment superiorly   Anterior Chamber Deep and quiet deep and clear, no cell or flare   Iris Round and moderately dilated Round and moderately dilated, focal atrophy and TID at 0300   Lens PC IOL in good position with open PC PC IOL in good position with open PC   Anterior Vitreous Vitreous syneresis, Posterior vitreous detachment, vitreous condensations Vitreous syneresis, Posterior vitreous detachment         Fundus Exam       Right Left   Disc mild Pallor, Sharp rim mild Pallor, mild temporal PPA, mild cupping, disc heme at superiorly, sharp rim, tilted, +SVP   C/D Ratio 0.5 0.65   Macula Flat, Blunted foveal reflex, mild ERM, RPE mottling, No heme or edema Flat, Blunted foveal reflex, ERM with +cystic changes, no heme   Vessels attenuated, Tortuous attenuated, Tortuous   Periphery Attached, blonde fundus Attached, blonde fundus, persistent MA/IRH superior midzone along superior venule - ?BRVO           Refraction     Wearing Rx       Sphere Cylinder Axis   Right +0.75 Sphere    Left -1.00 +1.00 022           IMAGING AND PROCEDURES  Imaging and Procedures for 05/05/2022  OCT, Retina - OU - Both Eyes       Right Eye Quality was good. Central Foveal Thickness: 400. Progression has been stable. Findings include no IRF, no SRF, abnormal foveal contour, retinal drusen , epiretinal membrane, vitreomacular adhesion (ERM with Blunted foveal contour and central thickening ).   Left Eye Quality was  good. Central Foveal Thickness: 423. Progression has been stable. Findings include no SRF, abnormal foveal contour, retinal drusen , epiretinal membrane, intraretinal fluid, vitreomacular adhesion (Mild interval improvement in persistent central cystic changes, ERM with central thickening).   Notes *Images captured and stored on drive  Diagnosis / Impression:  ERM OU  OD: ERM with Blunted foveal contour and central thickening  OS: Mild interval improvement in persistent central cystic changes, ERM with central thickening   Clinical management:  See below  Abbreviations: NFP - Normal foveal profile. CME - cystoid macular edema. PED - pigment epithelial detachment. IRF - intraretinal fluid. SRF - subretinal fluid. EZ - ellipsoid zone. ERM - epiretinal membrane. ORA - outer retinal atrophy. ORT - outer retinal tubulation. SRHM - subretinal hyper-reflective material. IRHM - intraretinal hyper-reflective material            ASSESSMENT/PLAN:    ICD-10-CM   1. Epiretinal membrane (ERM) of both eyes  H35.373     2. Cystoid macular edema of left eye  H35.352 OCT, Retina - OU - Both Eyes    3. Essential hypertension  I10     4. Hypertensive retinopathy of both eyes  H35.033     5. Branch retinal vein occlusion of left eye with macular edema  H34.8320     6. Pseudophakia, both eyes  Z96.1       1,2. Epiretinal membrane, both eyes  - +ERM w/ blunting of foveal contour OU -- OS with +IRF / persistent cystic changes - FA (11.01.22) shows mild perifoveal staining / leakage OS suggestive of CME component - started on PF  and Prolensa QID OS on 11.1.22 - s/p STK OS #1 (12.20.22) - BCVA OD: 20/30; OS: 20/100 (stable) - OCT shows OD: ERM with Blunted foveal contour and central thickening; OS: Mild interval improvement in central cystic changes, ERM with central thickening  - FA (06.06.23) shows OD: Mild, late peripapillary staining; OS: Delayed venous return of superior venule, punctate  perivascular blockage along superior venule -- superior BRVO outside of macula  - begin taper  PF and Prolensa BID OS  - STK consent signed 12.20.22  - will hold off on ERM surgery until CME is fully addressed, but did discuss ERM surgery in great detail again -- pt wishes to hold off for now as well - f/u 6 weeks -- DFE/OCT  3,4. Hypertensive retinopathy OU  - BP in office 6.6.23 was 171/73 - discussed importance of tight BP control - monitor   5. BRVO OS - new onset retinal hemorrhages along superior venule, superior to disc - OCT shows ERM w/ cystic changes - ?minimal contribution from BRVO - FA 6.6.23 shows delayed venous return through superior venule suggestive of BRVO - pt also reports history of recent TIA-like episode (telephone note on 4.12.23) -- pt experienced transient visual field loss and blurred vision that resolved over several minutes  - BP 171/73 - all of these factors raise concern / increased risk of possible cardiovascular compromise, I.e. CVA, MI, etc. - discussed findings and case with pt's PCP -- Dr. 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 Felipa Eth to schedule pt back for early f/u and further evaluation -- appreciate his expertise - no retinal or ophthalmic interventions indicated or recommended at this time -- if hemorrhages worsen, may benefit from intravitreal anti-VEGF therapy - f/u in 6 wks, sooner prn -- DFE/OCT  6. Pseudophakia OU  - s/p CE/IOL (Dr. Vilinda Blanks)  - IOLs in good position, doing well  - monitor   Ophthalmic Meds Ordered this visit:  No orders of the defined types were placed in this encounter.    Return in about 6 weeks (around 06/16/2022) for DFE, OCT, possible FA.  There are no  Patient Instructions on file for this visit.  Explained the diagnoses, plan, and follow up with the patient and they expressed understanding.  Patient expressed understanding of the importance of proper follow up care.   This document serves as a record of services personally performed by Gardiner Sleeper, MD, PhD. It was created on their behalf by Leonie Douglas, an ophthalmic technician. The creation of this record is the provider's dictation and/or activities during the visit.    Electronically signed by: Leonie Douglas COA, 05/07/22  2:38 AM  This document serves as a record of services personally performed by Gardiner Sleeper, MD, PhD. It was created on their behalf by Renaldo Reel, [place] an ophthalmic technician. The creation of this record is the provider's dictation and/or activities during the visit.    Electronically signed by:  Renaldo Reel, COT  05/05/22  2:38 AM  Gardiner Sleeper, M.D., Ph.D. Diseases & Surgery of the Retina and Vitreous [facility]  I have reviewed the above documentation for accuracy and completeness, and I agree with the above. Gardiner Sleeper, M.D., Ph.D. 05/07/22 2:41  AM    Abbreviations: M myopia (nearsighted); A astigmatism; H hyperopia (farsighted); P presbyopia; Mrx spectacle prescription;  CTL contact lenses; OD right eye; OS left eye; OU both eyes  XT exotropia; ET esotropia; PEK punctate epithelial keratitis; PEE punctate epithelial erosions; DES dry eye syndrome; MGD meibomian gland dysfunction; ATs artificial tears; PFAT's preservative free artificial tears; [place] nuclear sclerotic cataract; PSC posterior subcapsular cataract; ERM epi-retinal membrane; PVD posterior vitreous detachment; RD retinal detachment; DM diabetes mellitus; DR diabetic retinopathy; NPDR non-proliferative diabetic retinopathy; PDR proliferative diabetic retinopathy; CSME clinically significant macular edema; DME diabetic macular edema; dbh dot blot  hemorrhages; CWS cotton wool spot; POAG primary open angle glaucoma; C/D cup-to-disc ratio; HVF humphrey visual field; GVF goldmann visual field; OCT optical coherence tomography; IOP intraocular pressure; BRVO Branch retinal vein occlusion; CRVO central retinal vein occlusion; CRAO central retinal artery occlusion; BRAO branch retinal artery occlusion; RT retinal tear; SB scleral buckle; PPV pars plana vitrectomy; VH Vitreous hemorrhage; PRP panretinal laser photocoagulation; IVK intravitreal kenalog; VMT vitreomacular traction; MH Macular hole;  NVD neovascularization of the disc; NVE neovascularization elsewhere; AREDS age related eye disease study; ARMD age related macular degeneration; POAG primary open angle glaucoma; EBMD epithelial/anterior basement membrane dystrophy; ACIOL anterior chamber intraocular lens; IOL intraocular lens; PCIOL posterior chamber intraocular lens; Phaco/IOL phacoemulsification with intraocular lens placement; [place] photorefractive keratectomy; LASIK laser assisted in situ keratomileusis; HTN hypertension; DM diabetes mellitus; COPD chronic obstructive pulmonary disease

## 2022-05-05 ENCOUNTER — Encounter (INDEPENDENT_AMBULATORY_CARE_PROVIDER_SITE_OTHER): Payer: Self-pay | Admitting: Ophthalmology

## 2022-05-05 ENCOUNTER — Ambulatory Visit (INDEPENDENT_AMBULATORY_CARE_PROVIDER_SITE_OTHER): Payer: Medicare Other | Admitting: Ophthalmology

## 2022-05-05 DIAGNOSIS — H35373 Puckering of macula, bilateral: Secondary | ICD-10-CM | POA: Diagnosis not present

## 2022-05-05 DIAGNOSIS — Z961 Presence of intraocular lens: Secondary | ICD-10-CM

## 2022-05-05 DIAGNOSIS — I10 Essential (primary) hypertension: Secondary | ICD-10-CM | POA: Diagnosis not present

## 2022-05-05 DIAGNOSIS — H35033 Hypertensive retinopathy, bilateral: Secondary | ICD-10-CM | POA: Diagnosis not present

## 2022-05-05 DIAGNOSIS — H34832 Tributary (branch) retinal vein occlusion, left eye, with macular edema: Secondary | ICD-10-CM

## 2022-05-05 DIAGNOSIS — H35352 Cystoid macular degeneration, left eye: Secondary | ICD-10-CM

## 2022-05-07 ENCOUNTER — Encounter (INDEPENDENT_AMBULATORY_CARE_PROVIDER_SITE_OTHER): Payer: Self-pay | Admitting: Ophthalmology

## 2022-06-07 NOTE — Progress Notes (Signed)
[facility] Note  06/08/2022     CHIEF COMPLAINT Patient presents for Retina Follow Up  HISTORY OF PRESENT ILLNESS: Nancy Webster is a 88 y.o. female who presents to the clinic today for:   HPI     Retina Follow Up   Patient presents with  Other.  In both eyes.  This started months ago.  Duration of 4.5.  I, the attending physician,  performed the HPI with the patient and updated documentation appropriately.        Comments   Patient feels that the vision is the same. She is complaining that the left eye is red and is a little sore when blinking or rolling the eye.       Last edited by Bernarda Caffey, MD on 06/08/2022  1:16 PM.    Pt is here because her left eye is red and sore, she states she woke up like that yesterday morning, vision seems the same, she is not on aspirin or blood thinners  Referring physician: Lajean Manes, MD [address] [place],  [place] [address]  HISTORICAL INFORMATION:  Selected notes from the MEDICAL RECORD NUMBER Referred by Dr. Ellie Lunch for eval of ERM OU   CURRENT MEDICATIONS: Current Outpatient Medications (Ophthalmic Drugs)  Medication Sig   Bromfenac Sodium (PROLENSA) 0.07 % SOLN Place 1 drop into the left eye 4 (four) times daily.   Polyethyl Glycol-Propyl Glycol (SYSTANE) 0.4-0.3 % SOLN Place 1 drop into both eyes 2 (two) times daily.   prednisoLONE acetate (PRED FORTE) 1 % ophthalmic suspension PLACE 1 DROP INTO LEFT EYE FOUR TIMES A DAY  *WAIT 3-5 MINUTES BETWEEN 2 EYE MEDS* *SHAKE WELL* *STORE UPRIGHT*   PROLENSA 0.07 % SOLN PLACE 1 DROP INTO THE LEFT EYE 4 (FOUR) TIMES DAILY.   No current facility-administered medications for this visit. (Ophthalmic Drugs)   Current Outpatient Medications (Other)  Medication Sig   acetaminophen (TYLENOL) 500 MG tablet Take 500 mg by mouth every 8 (eight) hours as needed for mild pain or headache.   ALPRAZolam (XANAX) 0.5 MG tablet Take 1-2 tablets by mouth  as needed for anxiety   amoxicillin-clavulanate (AUGMENTIN) 500-125 MG tablet Take 1 tablet by mouth 2 (two) times daily.   benazepril (LOTENSIN) 20 MG tablet Take 20 mg by mouth daily.   Biotin 5000 MCG TABS Take 1 tablet by mouth daily.   Blood Glucose Monitoring Suppl (FREESTYLE LITE) DEVI    cetirizine (ZYRTEC) 10 MG tablet Take 10 mg by mouth daily.   Cholecalciferol (VITAMIN D3) 1.25 MG (50000 UT) TABS Take 1 tablet by mouth daily.   diclofenac Sodium (VOLTAREN) 1 % GEL Apply topically.   fluticasone (FLONASE) 50 MCG/ACT nasal spray Place 1 spray into both nostrils daily.   furosemide (LASIX) 20 MG tablet TAKE 1/2 TABLET = 10 MG BY MOUTH ONCE DAILY   gabapentin (NEURONTIN) 100 MG capsule Take 100 mg by mouth at bedtime.   glucose blood (FREESTYLE LITE) test strip    ibuprofen (ADVIL) 200 MG tablet Take 200 mg by mouth as needed.   MEGARED OMEGA-3 KRILL OIL PO Take 1 tablet by mouth daily.   metFORMIN (GLUCOPHAGE) 1000 MG tablet Take 500 mg by mouth 2 (two) times daily.   metFORMIN (GLUCOPHAGE) 500 MG tablet Take 500 mg by mouth 2 (two) times daily.   metoprolol succinate (TOPROL XL) 25 MG 24 hr tablet Take 0.5 tablets (12.5 mg total) by mouth daily.  Multiple Vitamins-Minerals (CENTRUM SILVER ADULT 50+) TABS Take 1 tablet by mouth daily.   pantoprazole (PROTONIX) 40 MG tablet Take 40 mg by mouth daily.   sodium chloride (OCEAN) 0.65 % SOLN nasal spray Place 1 spray into both nostrils as needed for congestion.   triamcinolone acetonide (KENALOG-40) 40 MG/ML injection Inject 2 mLs into the muscle once.   No current facility-administered medications for this visit. (Other)   REVIEW OF SYSTEMS: ROS   Positive for: Endocrine, Cardiovascular, Eyes Negative for: Constitutional, Gastrointestinal, Neurological, Skin, Genitourinary, Musculoskeletal, HENT, Respiratory, Psychiatric, Allergic/Imm, Heme/Lymph Last edited by Annie Paras, COT on 06/08/2022  7:57 AM.      ALLERGIES Allergies  Allergen Reactions   Atorvastatin Other (See Comments)    UNKNOWN   Other Other (See Comments)    Cramps to all Cholesterol medications   Statins Other (See Comments)    "cramps" UNKNOWN   PAST MEDICAL HISTORY Past Medical History:  Diagnosis Date   Anxiety    Coronary artery disease    Diabetes mellitus without complication ([place])    GERD (gastroesophageal reflux disease)    Hypertension    Renal disorder    stage 3   Past Surgical History:  Procedure Laterality Date   CATARACT EXTRACTION Bilateral    [place]   CHOLECYSTECTOMY N/A 03/02/2018   Procedure: LAPAROSCOPIC CHOLECYSTECTOMY;  Surgeon: Greer Pickerel, MD;  Location: [facility];  Service: General;  Laterality: N/A;   ERCP N/A 06/26/2019   Procedure: ENDOSCOPIC RETROGRADE CHOLANGIOPANCREATOGRAPHY (ERCP);  Surgeon: [name], MD;  Location: [facility];  Service: Gastroenterology;  Laterality: N/A;   SPHINCTEROTOMY  06/26/2019   Procedure: SPHINCTEROTOMY;  Surgeon: [name], MD;  Location: [facility] ENDOSCOPY;  Service: Gastroenterology;;   FAMILY HISTORY Family History  Problem Relation Age of Onset   Heart failure Mother    Diabetes Daughter    SOCIAL HISTORY Social History   Tobacco Use   Smoking status: Never   Smokeless tobacco: Never  Vaping Use   Vaping Use: Never used  Substance Use Topics   Alcohol use: Not Currently   Drug use: Never       OPHTHALMIC EXAM: Base Eye Exam     Visual Acuity (Snellen - Linear)       Right Left   Dist [place] 20/40 20/100 +2   Dist ph [place] NI NI         Tonometry (Tonopen, 8:02 AM)       Right Left   Pressure 18 20         Pupils       Dark Light Shape React APD   Right 2 1 Round Slow None   Left 2 1 Round Slow None         Visual Fields       Left Right    Full Full         Extraocular Movement       Right Left    Full, Ortho Full, Ortho         Neuro/Psych     Oriented x3: Yes   Mood/Affect: Normal          Dilation     Both eyes: 1.0% Mydriacyl @ 7:59 AM           Slit Lamp and Fundus Exam     Slit Lamp Exam       Right Left   Lids/Lashes Dermatochalasis - upper lid, mild MGD Dermatochalasis - upper lid, mild MGD  Conjunctiva/Sclera White and quiet 360 subconj heme, STK ST quad   Cornea well healed cataract wound, mild arcus Trace PEE, well healed cataract wound, mild endo pigment superiorly   Anterior Chamber Deep and quiet deep and clear, no cell or flare   Iris Round and moderately dilated Round and moderately dilated, focal atrophy and TID at 0300   Lens PC IOL in good position with open PC PC IOL in good position with open PC   Anterior Vitreous Vitreous syneresis, Posterior vitreous detachment, vitreous condensations Vitreous syneresis, Posterior vitreous detachment         Fundus Exam       Right Left   Disc mild Pallor, Sharp rim, PPA mild Pallor, mild temporal PPA, mild cupping, disc heme at superiorly, sharp rim, tilted, +SVP   C/D Ratio 0.5 0.65   Macula Flat, Blunted foveal reflex, mild ERM, RPE mottling, No heme or edema Flat, Blunted foveal reflex, ERM with +cystic changes -- slightly increased, no heme   Vessels attenuated, Tortuous attenuated, Tortuous   Periphery Attached, blonde fundus Attached, blonde fundus, persistent MA/IRH superior midzone along superior venule - ?BRVO, no edema           Refraction     Wearing Rx       Sphere Cylinder Axis   Right +0.75 Sphere    Left -1.00 +1.00 022           IMAGING AND PROCEDURES  Imaging and Procedures for 06/08/2022  OCT, Retina - OU - Both Eyes       Right Eye Quality was good. Central Foveal Thickness: 393. Progression has been stable. Findings include no IRF, no SRF, abnormal foveal contour, retinal drusen , epiretinal membrane, vitreomacular adhesion (ERM with Blunted foveal contour and central thickening ).   Left Eye Quality was good. Central Foveal Thickness: 458. Progression has  worsened. Findings include no SRF, abnormal foveal contour, retinal drusen , epiretinal membrane, intraretinal fluid, vitreomacular adhesion (interval increase in central cystic changes, ERM with central thickening).   Notes *Images captured and stored on drive  Diagnosis / Impression:  ERM OU  OD: ERM with Blunted foveal contour and central thickening  OS: interval increase in central cystic changes, ERM with central thickening  Clinical management:  See below  Abbreviations: NFP - Normal foveal profile. CME - cystoid macular edema. PED - pigment epithelial detachment. IRF - intraretinal fluid. SRF - subretinal fluid. EZ - ellipsoid zone. ERM - epiretinal membrane. ORA - outer retinal atrophy. ORT - outer retinal tubulation. SRHM - subretinal hyper-reflective material. IRHM - intraretinal hyper-reflective material      Intravitreal Injection, Pharmacologic Agent - OS - Left Eye       Time Out 06/08/2022. 8:48 AM. Confirmed correct patient, procedure, site, and patient consented.   Anesthesia Topical anesthesia was used. Anesthetic medications included Lidocaine 2%, Proparacaine 0.5%.   Procedure Preparation included eyelid speculum. A supplied needle was used.   Injection: 1.25 mg Bevacizumab 1.'25mg'$ /0.86m   Route: Intravitreal, Site: Left Eye   NDC:: 40102-725-36 Lot: [id number]'$ , Expiration date: 07/20/2022   Post-op Post injection exam found visual acuity of at least counting fingers. The patient tolerated the procedure well. There were no complications. The patient received written and verbal post procedure care education.            ASSESSMENT/PLAN:    ICD-10-CM   1. Epiretinal membrane (ERM) of both eyes  H35.373 OCT, Retina - OU - Both Eyes    2. Cystoid macular  edema of left eye  H35.352     3. Essential hypertension  I10     4. Hypertensive retinopathy of both eyes  H35.033     5. Branch retinal vein occlusion of left eye with macular edema  H34.8320  Intravitreal Injection, Pharmacologic Agent - OS - Left Eye    Bevacizumab (AVASTIN) SOLN 1.25 mg    6. Pseudophakia, both eyes  Z96.1      1,2. Epiretinal membrane, both eyes  - +ERM w/ blunting of foveal contour OU -- OS with +IRF / persistent cystic changes - FA (11.01.22) shows mild perifoveal staining / leakage OS suggestive of CME component - started on PF and Prolensa QID OS on 11.1.22 - s/p STK OS #1 (12.20.22) - BCVA OD: 20/30; OS: 20/100 (stable) - OCT shows OD: ERM with Blunted foveal contour and central thickening; OS: interval increase in central cystic changes, ERM with central thickening  - FA (06.06.23) shows OD: Mild, late peripapillary staining; OS: Delayed venous return of superior venule, punctate perivascular blockage along superior venule -- superior BRVO outside of macula  - cont PF and Prolensa BID OS  - STK consent signed 12.20.22  - pt wishes to hold off on surgery  3,4. Hypertensive retinopathy OU  - BP in office 6.6.23 was 171/73 - discussed importance of tight BP control - monitor   5. BRVO OS - persistent retinal hemorrhages along superior venule, superior to disc - OCT shows ERM w/ cystic changes - ?some contribution from BRVO - FA 6.6.23 shows delayed venous return through superior venule suggestive of BRVO - pt also reports history of recent TIA-like episode (telephone note on 4.12.23) -- pt experienced transient visual field loss and blurred vision that resolved over several minutes  - BP at last visit (05/05/22) -- 171/73 - all of these factors raise concern / increased risk of possible cardiovascular compromise, I.e. CVA, MI, etc. - discussed findings and case with pt's PCP -- Dr. 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 Pseudophakia OU  - s/p CE/IOL (Dr. Vilinda Blanks)  - IOLs in good position, doing well  - monitor   Ophthalmic Meds Ordered this visit:  Meds ordered this encounter  Medications   Bevacizumab (AVASTIN) SOLN 1.25 mg     Return in about 4 weeks (around 07/06/2022) for f/u BRVO OS, DFE, OCT.  There are no Patient Instructions on file for this visit.  Explained the diagnoses, plan, and follow up with the patient and they expressed understanding.  Patient expressed understanding of the importance of proper follow up care.   This document serves as a record of services personally performed by Gardiner Sleeper, MD, PhD. It was created on their behalf by San Jetty. Owens Shark, OA an ophthalmic technician. The creation of this record is the provider's dictation and/or activities during the visit.    Electronically signed by: San Jetty. Owens Shark, [place] 08.09.2023 1:21 PM  Gardiner Sleeper, M.D., Ph.D. Diseases & Surgery of the Retina and  Vitreous [facility]  I have reviewed the above documentation for accuracy and completeness, and I agree with the above. Gardiner Sleeper, M.D., Ph.D. 06/08/22 1:21 PM  Abbreviations: M myopia (nearsighted); A astigmatism; H hyperopia (farsighted); P presbyopia; Mrx spectacle prescription;  CTL contact lenses; OD right eye; OS left eye; OU both eyes  XT exotropia; ET esotropia; PEK punctate epithelial keratitis; PEE punctate epithelial erosions; DES dry eye syndrome; MGD meibomian gland dysfunction; ATs artificial tears; PFAT's preservative free artificial tears; [place] nuclear sclerotic cataract; PSC posterior subcapsular cataract; ERM epi-retinal membrane; PVD posterior vitreous  detachment; RD retinal detachment; DM diabetes mellitus; DR diabetic retinopathy; NPDR non-proliferative diabetic retinopathy; PDR proliferative diabetic retinopathy; CSME clinically significant macular edema; DME diabetic macular edema; dbh dot blot hemorrhages; CWS cotton wool spot; POAG primary open angle glaucoma; C/D cup-to-disc ratio; HVF humphrey visual field; GVF goldmann visual field; OCT optical coherence tomography; IOP intraocular pressure; BRVO Branch retinal vein occlusion; CRVO central retinal vein occlusion; CRAO central retinal artery occlusion; BRAO branch retinal artery occlusion; RT retinal tear; SB scleral buckle; PPV pars plana vitrectomy; VH Vitreous hemorrhage; PRP panretinal laser photocoagulation; IVK intravitreal kenalog; VMT vitreomacular traction; MH Macular hole;  NVD neovascularization of the disc; NVE neovascularization elsewhere; AREDS age related eye disease study; ARMD age related macular degeneration; POAG primary open angle glaucoma; EBMD epithelial/anterior basement membrane dystrophy; ACIOL anterior chamber intraocular lens; IOL intraocular lens; PCIOL posterior chamber intraocular lens; Phaco/IOL phacoemulsification with intraocular lens placement; [place] photorefractive keratectomy; LASIK laser assisted in situ keratomileusis; HTN hypertension; DM diabetes mellitus; COPD chronic obstructive pulmonary disease

## 2022-06-08 ENCOUNTER — Ambulatory Visit (INDEPENDENT_AMBULATORY_CARE_PROVIDER_SITE_OTHER): Payer: Medicare Other | Admitting: Ophthalmology

## 2022-06-08 ENCOUNTER — Encounter (INDEPENDENT_AMBULATORY_CARE_PROVIDER_SITE_OTHER): Payer: Self-pay | Admitting: Ophthalmology

## 2022-06-08 DIAGNOSIS — H35373 Puckering of macula, bilateral: Secondary | ICD-10-CM | POA: Diagnosis not present

## 2022-06-08 DIAGNOSIS — I10 Essential (primary) hypertension: Secondary | ICD-10-CM

## 2022-06-08 DIAGNOSIS — Z961 Presence of intraocular lens: Secondary | ICD-10-CM

## 2022-06-08 DIAGNOSIS — H35033 Hypertensive retinopathy, bilateral: Secondary | ICD-10-CM

## 2022-06-08 DIAGNOSIS — H35352 Cystoid macular degeneration, left eye: Secondary | ICD-10-CM | POA: Diagnosis not present

## 2022-06-08 DIAGNOSIS — H34832 Tributary (branch) retinal vein occlusion, left eye, with macular edema: Secondary | ICD-10-CM | POA: Diagnosis not present

## 2022-06-08 DIAGNOSIS — N760 Acute vaginitis: Secondary | ICD-10-CM | POA: Diagnosis not present

## 2022-06-08 MED ORDER — BEVACIZUMAB CHEMO INJECTION 1.25MG/0.05ML SYRINGE FOR KALEIDOSCOPE
1.2500 mg | INTRAVITREAL | Status: AC | PRN
  Administered 2022-06-08: 1.25 mg via INTRAVITREAL

## 2022-06-13 DIAGNOSIS — R300 Dysuria: Secondary | ICD-10-CM | POA: Diagnosis not present

## 2022-06-13 DIAGNOSIS — N760 Acute vaginitis: Secondary | ICD-10-CM | POA: Diagnosis not present

## 2022-06-16 ENCOUNTER — Encounter (INDEPENDENT_AMBULATORY_CARE_PROVIDER_SITE_OTHER): Payer: Medicare Other | Admitting: Ophthalmology

## 2022-06-28 NOTE — Progress Notes (Signed)
[facility] Note  07/06/2022     CHIEF COMPLAINT Patient presents for Retina Follow Up  HISTORY OF PRESENT ILLNESS: Nancy Webster is a 88 y.o. female who presents to the clinic today for:   HPI     Retina Follow Up   Patient presents with  CRVO/BRVO.  In left eye.  Severity is moderate.  Duration of 4 weeks.  Since onset it is stable.  I, the attending physician,  performed the HPI with the patient and updated documentation appropriately.        Comments   Pt here for 4 wk ret f/u BRVO OS       Last edited by Bernarda Caffey, MD on 07/06/2022  4:25 PM.    Pt states no problems after first injection, her eyes have been dry, she feels like her eye has been opening better recently   Referring physician: Lajean Manes, [place]. [address] [place],  [place] [address]  HISTORICAL INFORMATION:  Selected notes from the MEDICAL RECORD NUMBER Referred by Dr. Ellie Lunch for eval of ERM OU   CURRENT MEDICATIONS: Current Outpatient Medications (Ophthalmic Drugs)  Medication Sig   Bromfenac Sodium (PROLENSA) 0.07 % SOLN Place 1 drop into the left eye 4 (four) times daily.   Polyethyl Glycol-Propyl Glycol (SYSTANE) 0.4-0.3 % SOLN Place 1 drop into both eyes 2 (two) times daily.   prednisoLONE acetate (PRED FORTE) 1 % ophthalmic suspension PLACE 1 DROP INTO LEFT EYE FOUR TIMES A DAY  *WAIT 3-5 MINUTES BETWEEN 2 EYE MEDS* *SHAKE WELL* *STORE UPRIGHT*   PROLENSA 0.07 % SOLN PLACE 1 DROP INTO THE LEFT EYE 4 (FOUR) TIMES DAILY.   No current facility-administered medications for this visit. (Ophthalmic Drugs)   Current Outpatient Medications (Other)  Medication Sig   acetaminophen (TYLENOL) 500 MG tablet Take 500 mg by mouth every 8 (eight) hours as needed for mild pain or headache.   ALPRAZolam (XANAX) 0.5 MG tablet Take 1-2 tablets by mouth as needed for anxiety   amoxicillin-clavulanate (AUGMENTIN) 500-125 MG tablet Take 1 tablet by mouth 2 (two) times  daily.   benazepril (LOTENSIN) 20 MG tablet Take 20 mg by mouth daily.   Biotin 5000 MCG TABS Take 1 tablet by mouth daily.   Blood Glucose Monitoring Suppl (FREESTYLE LITE) DEVI    cetirizine (ZYRTEC) 10 MG tablet Take 10 mg by mouth daily.   Cholecalciferol (VITAMIN D3) 1.25 MG (50000 UT) TABS Take 1 tablet by mouth daily.   diclofenac Sodium (VOLTAREN) 1 % GEL Apply topically.   fluticasone (FLONASE) 50 MCG/ACT nasal spray Place 1 spray into both nostrils daily.   furosemide (LASIX) 20 MG tablet TAKE 1/2 TABLET = 10 MG BY MOUTH ONCE DAILY   gabapentin (NEURONTIN) 100 MG capsule Take 100 mg by mouth at bedtime.   glucose blood (FREESTYLE LITE) test strip    ibuprofen (ADVIL) 200 MG tablet Take 200 mg by mouth as needed.   MEGARED OMEGA-3 KRILL OIL PO Take 1 tablet by mouth daily.   metFORMIN (GLUCOPHAGE) 1000 MG tablet Take 500 mg by mouth 2 (two) times daily.   metFORMIN (GLUCOPHAGE) 500 MG tablet Take 500 mg by mouth 2 (two) times daily.   metoprolol succinate (TOPROL XL) 25 MG 24 hr tablet Take 0.5 tablets (12.5 mg total) by mouth daily.   Multiple Vitamins-Minerals (CENTRUM SILVER ADULT 50+) TABS Take 1 tablet by mouth daily.   pantoprazole (PROTONIX) 40 MG tablet Take 40  mg by mouth daily.   sodium chloride (OCEAN) 0.65 % SOLN nasal spray Place 1 spray into both nostrils as needed for congestion.   triamcinolone acetonide (KENALOG-40) 40 MG/ML injection Inject 2 mLs into the muscle once.   No current facility-administered medications for this visit. (Other)   REVIEW OF SYSTEMS: ROS   Positive for: Endocrine, Cardiovascular, Eyes Negative for: Constitutional, Gastrointestinal, Neurological, Skin, Genitourinary, Musculoskeletal, HENT, Respiratory, Psychiatric, Allergic/Imm, Heme/Lymph Last edited by Kingsley Spittle, COT on 07/06/2022  8:02 AM.      ALLERGIES Allergies  Allergen Reactions   Atorvastatin Other (See Comments)    UNKNOWN   Other Other (See Comments)    Cramps  to all Cholesterol medications   Statins Other (See Comments)    "cramps" UNKNOWN   PAST MEDICAL HISTORY Past Medical History:  Diagnosis Date   Anxiety    Coronary artery disease    Diabetes mellitus without complication ([place])    GERD (gastroesophageal reflux disease)    Hypertension    Renal disorder    stage 3   Past Surgical History:  Procedure Laterality Date   CATARACT EXTRACTION Bilateral    Khemsara   CHOLECYSTECTOMY N/A 03/02/2018   Procedure: LAPAROSCOPIC CHOLECYSTECTOMY;  Surgeon: Greer Pickerel, MD;  Location: [place];  Service: General;  Laterality: N/A;   ERCP N/A 06/26/2019   Procedure: ENDOSCOPIC RETROGRADE CHOLANGIOPANCREATOGRAPHY (ERCP);  Surgeon: [name], MD;  Location: [place];  Service: Gastroenterology;  Laterality: N/A;   SPHINCTEROTOMY  06/26/2019   Procedure: SPHINCTEROTOMY;  Surgeon: [name], MD;  Location: [facility] ENDOSCOPY;  Service: Gastroenterology;;   FAMILY HISTORY Family History  Problem Relation Age of Onset   Heart failure Mother    Diabetes Daughter    SOCIAL HISTORY Social History   Tobacco Use   Smoking status: Never   Smokeless tobacco: Never  Vaping Use   Vaping Use: Never used  Substance Use Topics   Alcohol use: Not Currently   Drug use: Never       OPHTHALMIC EXAM: Base Eye Exam     Visual Acuity (Snellen - Linear)       Right Left   Dist [place] 20/40 -1 20/150 -2   Dist ph [place] NI 20/150    Correction: Glasses         Tonometry (Tonopen, 8:17 AM)       Right Left   Pressure 9 12         Pupils       Dark Light Shape React APD   Right 2 1 Round Slow None   Left 2 1 Round Slow None         Visual Fields (Counting fingers)       Left Right    Full Full         Extraocular Movement       Right Left    Full, Ortho Full, Ortho         Neuro/Psych     Oriented x3: Yes   Mood/Affect: Normal         Dilation     Both eyes: 1.0% Mydriacyl, 2.5% Phenylephrine @ 8:17 AM            Slit Lamp and Fundus Exam     Slit Lamp Exam       Right Left   Lids/Lashes Dermatochalasis - upper lid, mild MGD Dermatochalasis - upper lid, mild MGD   Conjunctiva/Sclera White and quiet 360 subconj heme, STK ST quad  Cornea well healed cataract wound, mild arcus Trace PEE, well healed cataract wound, mild endo pigment superiorly   Anterior Chamber Deep and quiet deep and clear, no cell or flare   Iris Round and moderately dilated Round and moderately dilated, focal atrophy and TID at 0300   Lens PC IOL in good position with open PC PC IOL in good position with open PC   Anterior Vitreous Vitreous syneresis, Posterior vitreous detachment, vitreous condensations Vitreous syneresis, Posterior vitreous detachment         Fundus Exam       Right Left   Disc mild Pallor, Sharp rim, PPA mild Pallor, mild temporal PPA, mild cupping, disc heme at superiorly, sharp rim, tilted   C/D Ratio 0.5 0.65   Macula Flat, Blunted foveal reflex, mild ERM, RPE mottling, No heme or edema Flat, Blunted foveal reflex, ERM with +cystic changes -- slightly increased, no heme   Vessels attenuated, Tortuous attenuated, Tortuous   Periphery Attached, blonde fundus Attached, blonde fundus, persistent punctate MA/IRH/SRH superior midzone along superior venule - BRVO, no edema           Refraction     Wearing Rx       Sphere Cylinder Axis   Right +0.75 Sphere    Left -1.00 +1.00 022         Manifest Refraction       Sphere Cylinder Axis Dist VA   Right       Left -0.75 +1.00 010 100-2           IMAGING AND PROCEDURES  Imaging and Procedures for 07/06/2022  OCT, Retina - OU - Both Eyes       Right Eye Quality was good. Central Foveal Thickness: 392. Progression has been stable. Findings include no IRF, no SRF, abnormal foveal contour, retinal drusen , epiretinal membrane, vitreomacular adhesion (ERM with Blunted foveal contour and central thickening ).   Left Eye Quality was good.  Central Foveal Thickness: 471. Progression has worsened. Findings include no SRF, abnormal foveal contour, retinal drusen , epiretinal membrane, intraretinal fluid, vitreomacular adhesion (Mild interval increase in IRF inferior fovea, mild interval increase in ERM thickness).   Notes *Images captured and stored on drive  Diagnosis / Impression:  ERM OU  OD: ERM with Blunted foveal contour and central thickening  OS: Mild interval increase in IRF inferior fovea, mild interval increase in ERM thickness  Clinical management:  See below  Abbreviations: NFP - Normal foveal profile. CME - cystoid macular edema. PED - pigment epithelial detachment. IRF - intraretinal fluid. SRF - subretinal fluid. EZ - ellipsoid zone. ERM - epiretinal membrane. ORA - outer retinal atrophy. ORT - outer retinal tubulation. SRHM - subretinal hyper-reflective material. IRHM - intraretinal hyper-reflective material      Intravitreal Injection, Pharmacologic Agent - OS - Left Eye       Time Out 07/06/2022. 8:49 AM. Confirmed correct patient, procedure, site, and patient consented.   Anesthesia Topical anesthesia was used. Anesthetic medications included Lidocaine 2%, Proparacaine 0.5%.   Procedure Preparation included eyelid speculum. A supplied needle was used.   Injection: 1.25 mg Bevacizumab 1.71m/0.05ml   Route: Intravitreal, Site: Left Eye   NDC:: 93818-299-37 Lot:: [id number] Expiration date: 08/08/2022   Post-op Post injection exam found visual acuity of at least counting fingers. The patient tolerated the procedure well. There were no complications. The patient received written and verbal post procedure care education.  ASSESSMENT/PLAN:    ICD-10-CM   1. Epiretinal membrane (ERM) of both eyes  H35.373 OCT, Retina - OU - Both Eyes    2. Cystoid macular edema of left eye  H35.352 Intravitreal Injection, Pharmacologic Agent - OS - Left Eye    Bevacizumab (AVASTIN) SOLN 1.25 mg    3.  Essential hypertension  I10     4. Hypertensive retinopathy of both eyes  H35.033     5. Branch retinal vein occlusion of left eye with macular edema  H34.8320 Intravitreal Injection, Pharmacologic Agent - OS - Left Eye    Bevacizumab (AVASTIN) SOLN 1.25 mg    6. Pseudophakia, both eyes  Z96.1      1,2. Epiretinal membrane, both eyes  - +ERM w/ blunting of foveal contour OU -- OS with +IRF / persistent cystic changes - FA (11.01.22) shows mild perifoveal staining / leakage OS suggestive of CME component - started on PF and Prolensa QID OS on 11.1.22 - s/p STK OS #1 (12.20.22) - BCVA OD: 20/30; OS: 20/100 (stable) - OCT shows OD: ERM with Blunted foveal contour and central thickening; OS: interval increase in central cystic changes, ERM with central thickening  - FA (06.06.23) shows OD: Mild, late peripapillary staining; OS: Delayed venous return of superior venule, punctate perivascular blockage along superior venule -- superior BRVO outside of macula  - cont PF and Prolensa BID OS  - STK consent signed 12.20.22  - pt wishes to hold off on surgery  3,4. Hypertensive retinopathy OU  - BP in office 6.6.23 was 171/73 - discussed importance of tight BP control - monitor   5. BRVO OS  - s/p IVA OS #1 (08.10.23) - persistent retinal hemorrhages along superior venule, superior to disc - OCT shows mild interval increase in IRF inferior fovea, mild interval increase in ERM thickness - FA 6.6.23 shows delayed venous return through superior venule suggestive of BRVO - pt also reports history of recent TIA-like episode (telephone note on 4.12.23) -- pt experienced transient visual field loss and blurred vision that resolved over several minutes  - BP at last visit (05/05/22) -- 171/73 - all of these factors raise concern / increased risk of possible cardiovascular compromise, I.e. CVA, MI, etc. - discussed findings and case with pt's PCP -- Dr. 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 Pseudophakia OU  - s/p CE/IOL (Dr. Vilinda Blanks)  - IOLs in good position, doing well  - monitor   Ophthalmic Meds Ordered this visit:  Meds ordered this encounter  Medications   Bevacizumab (AVASTIN) SOLN 1.25 mg     Return in about 4 weeks (around 08/03/2022) for f/u BRVO OS, DFE, OCT.  There are no Patient Instructions on file for this visit.  Explained the diagnoses, plan, and follow up with the patient and they expressed understanding.  Patient expressed understanding of the importance of proper follow up care.   This document serves as a record of services personally performed by Gardiner Sleeper,  MD, PhD. It was created on their behalf by San Jetty. Owens Shark, OA an ophthalmic technician. The creation of this record is the provider's dictation and/or activities during the visit.    Electronically signed by: San Jetty. [place], [place] 08.30.2023 4:25 PM  Gardiner Sleeper, M.D., Ph.D. Diseases & Surgery of the Retina and Vitreous [facility]  I have reviewed the above documentation for accuracy and completeness, and I agree with the above. Gardiner Sleeper, M.D., Ph.D. 07/06/22 4:27 PM  Abbreviations: M myopia (nearsighted); A astigmatism; H hyperopia (farsighted); P presbyopia; Mrx spectacle prescription;  CTL contact lenses; OD right eye; OS left eye; OU both eyes  XT exotropia; ET esotropia; PEK punctate epithelial keratitis; PEE punctate epithelial erosions; DES dry eye  syndrome; MGD meibomian gland dysfunction; ATs artificial tears; PFAT's preservative free artificial tears; [place] nuclear sclerotic cataract; PSC posterior subcapsular cataract; ERM epi-retinal membrane; PVD posterior vitreous detachment; RD retinal detachment; DM diabetes mellitus; DR diabetic retinopathy; NPDR non-proliferative diabetic retinopathy; PDR proliferative diabetic retinopathy; CSME clinically significant macular edema; DME diabetic macular edema; dbh dot blot hemorrhages; CWS cotton wool spot; POAG primary open angle glaucoma; C/D cup-to-disc ratio; HVF humphrey visual field; GVF goldmann visual field; OCT optical coherence tomography; IOP intraocular pressure; BRVO Branch retinal vein occlusion; CRVO central retinal vein occlusion; CRAO central retinal artery occlusion; BRAO branch retinal artery occlusion; RT retinal tear; SB scleral buckle; PPV pars plana vitrectomy; VH Vitreous hemorrhage; PRP panretinal laser photocoagulation; IVK intravitreal kenalog; VMT vitreomacular traction; MH Macular hole;  NVD neovascularization of the disc; NVE neovascularization elsewhere; AREDS age related eye disease study; ARMD age related macular degeneration; POAG primary open angle glaucoma; EBMD epithelial/anterior basement membrane dystrophy; ACIOL anterior chamber intraocular lens; IOL intraocular lens; PCIOL posterior chamber intraocular lens; Phaco/IOL phacoemulsification with intraocular lens placement; [place] photorefractive keratectomy; LASIK laser assisted in situ keratomileusis; HTN hypertension; DM diabetes mellitus; COPD chronic obstructive pulmonary disease

## 2022-07-06 ENCOUNTER — Ambulatory Visit (INDEPENDENT_AMBULATORY_CARE_PROVIDER_SITE_OTHER): Payer: Medicare Other | Admitting: Ophthalmology

## 2022-07-06 ENCOUNTER — Encounter (INDEPENDENT_AMBULATORY_CARE_PROVIDER_SITE_OTHER): Payer: Self-pay | Admitting: Ophthalmology

## 2022-07-06 DIAGNOSIS — H35033 Hypertensive retinopathy, bilateral: Secondary | ICD-10-CM

## 2022-07-06 DIAGNOSIS — H34832 Tributary (branch) retinal vein occlusion, left eye, with macular edema: Secondary | ICD-10-CM

## 2022-07-06 DIAGNOSIS — I10 Essential (primary) hypertension: Secondary | ICD-10-CM

## 2022-07-06 DIAGNOSIS — H35352 Cystoid macular degeneration, left eye: Secondary | ICD-10-CM

## 2022-07-06 DIAGNOSIS — H35373 Puckering of macula, bilateral: Secondary | ICD-10-CM | POA: Diagnosis not present

## 2022-07-06 DIAGNOSIS — Z961 Presence of intraocular lens: Secondary | ICD-10-CM

## 2022-07-06 MED ORDER — BEVACIZUMAB CHEMO INJECTION 1.25MG/0.05ML SYRINGE FOR KALEIDOSCOPE
1.2500 mg | INTRAVITREAL | Status: AC | PRN
  Administered 2022-07-06: 1.25 mg via INTRAVITREAL

## 2022-07-09 NOTE — Progress Notes (Unsigned)
Cardiology Office Note   Date:  07/12/2022   ID:  ZYLA DASCENZO, DOB 1928/05/19, MRN [id number]  PCP:  Damita Lack, MD  Cardiologist:   Dorris Carnes, MD   Patient presents for follow-up of dizziness and HTN       History of Present Illness: Nancy Webster is a 88 y.o. female with a history of SVT, DM (dx around age 68 yo), HTN, HL, GERD, CKD, trigeminal neuralgia (s/p gamma knife). In March 2022, she was orthostatic at visit   Feeling bad.   Cut back on meds    She continued to have problems with this and meds pulled back further  in APril 2022  I saw the pt in clinic in Jan 2023 The pt still having some dizzy spells Spells better, less severe than in the past   She still has occasionally, not often   She is careful    BP at home is uand down   120 to 180s/    She denies CP   Breathing is OK     Current Meds  Medication Sig   acetaminophen (TYLENOL) 500 MG tablet Take 500 mg by mouth every 8 (eight) hours as needed for mild pain or headache.   Biotin 5000 MCG TABS Take 1 tablet by mouth daily.   Blood Glucose Monitoring Suppl (FREESTYLE LITE) DEVI    Cholecalciferol (VITAMIN D3) 1.25 MG (50000 UT) TABS Take 1 tablet by mouth daily.   diclofenac Sodium (VOLTAREN) 1 % GEL Apply topically.   fluticasone (FLONASE) 50 MCG/ACT nasal spray Place 1 spray into both nostrils daily.   furosemide (LASIX) 20 MG tablet TAKE 1/2 TABLET = 10 MG BY MOUTH ONCE DAILY   glucose blood (FREESTYLE LITE) test strip    MEGARED OMEGA-3 KRILL OIL PO Take 1 tablet by mouth daily.   metFORMIN (GLUCOPHAGE) 500 MG tablet Take 500 mg by mouth 2 (two) times daily.   metoprolol succinate (TOPROL XL) 25 MG 24 hr tablet Take 0.5 tablets (12.5 mg total) by mouth daily.   Multiple Vitamins-Minerals (CENTRUM SILVER ADULT 50+) TABS Take 1 tablet by mouth daily.   pantoprazole (PROTONIX) 40 MG tablet Take 40 mg by mouth daily.   Polyethyl Glycol-Propyl Glycol (SYSTANE) 0.4-0.3 % SOLN Place 1 drop into both eyes 2  (two) times daily.   prednisoLONE acetate (PRED FORTE) 1 % ophthalmic suspension PLACE 1 DROP INTO LEFT EYE FOUR TIMES A DAY  *WAIT 3-5 MINUTES BETWEEN 2 EYE MEDS* *SHAKE WELL* *STORE UPRIGHT*   PROLENSA 0.07 % SOLN PLACE 1 DROP INTO THE LEFT EYE 4 (FOUR) TIMES DAILY.   REPATHA SURECLICK 932 MG/ML SOAJ Inject into the skin.   sodium chloride (OCEAN) 0.65 % SOLN nasal spray Place 1 spray into both nostrils as needed for congestion.   [DISCONTINUED] cloNIDine (CATAPRES) 0.1 MG tablet Take one tablet (0.1 mg) by mouth as needed for BP on top greater than 165.     Allergies:   Atorvastatin, Other, and Statins   Past Medical History:  Diagnosis Date   Anxiety    Coronary artery disease    Diabetes mellitus without complication ([place])    GERD (gastroesophageal reflux disease)    Hypertension    Renal disorder    stage 3    Past Surgical History:  Procedure Laterality Date   CATARACT EXTRACTION Bilateral    Khemsara   CHOLECYSTECTOMY N/A 03/02/2018   Procedure: LAPAROSCOPIC CHOLECYSTECTOMY;  Surgeon: Greer Pickerel, MD;  Location: [place];  Service: General;  Laterality: N/A;   ERCP N/A 06/26/2019   Procedure: ENDOSCOPIC RETROGRADE CHOLANGIOPANCREATOGRAPHY (ERCP);  Surgeon: [name], MD;  Location: [place];  Service: Gastroenterology;  Laterality: N/A;   SPHINCTEROTOMY  06/26/2019   Procedure: SPHINCTEROTOMY;  Surgeon: [name], MD;  Location: [facility] ENDOSCOPY;  Service: Gastroenterology;;     Social History:  The patient  reports that she has never smoked. She has never used smokeless tobacco. She reports that she does not currently use alcohol. She reports that she does not use drugs.   Family History:  The patient's family history includes Diabetes in her daughter; Heart failure in her mother.    ROS:  Please see the history of present illness. All other systems are reviewed and  Negative to the above problem except as noted.    PHYSICAL EXAM: VS:  BP (!) 140/80   Pulse 67    Ht 5' 3.5" (1.613 m)   Wt 148 lb (67.1 kg)   SpO2 96%   BMI 25.81 kg/m      GEN: Thin 88 yo in no acute distress  HEENT: normal  Neck: no JVD, no carotid bruits Cardiac: RRR; no murmurs  No  LE edema  Respiratory:  clear to auscultation bilaterally GI: soft, nontender, nondistended,No hepatomegaly   MS: no deformity Moving all extremities   Skin: warm and dry, no rash Neuro:  Strength and sensation are intact Psych: euthymic mood, full affect   EKG:  EKG is ordered today.    SR  67 bpm   MRI  June 2023  IMPRESSION: 1. No acute intracranial abnormality. No acute or focal skull base lesion to explain the patient's symptoms. 2. 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 3. Minimal white matter changes are otherwise within normal limits for age. 4. Small right mastoid effusion. No obstructing nasopharyngeal lesion is present   Lipid Panel    Component Value Date/Time   CHOL 137 07/10/2022 1013   TRIG 158 (H) 07/10/2022 1013   HDL 59 07/10/2022 1013   CHOLHDL 2.3 07/10/2022 1013   CHOLHDL 4.0 06/24/2019 2207   VLDL 26 06/24/2019 2207   LDLCALC 52 07/10/2022 1013      Wt Readings from Last 3 Encounters:  07/10/22 148 lb (67.1 kg)  11/03/21 155 lb 3.2 oz (70.4 kg)  06/24/21 152 lb 9.6 oz (69.2 kg)      ASSESSMENT AND PLAN:  1 Dizziness   Still having some dizziness but much better  ? If some of this is not related to BP  ? Eyes (on avastin)  ? Ears  (having fullness in R ear)   Follow      2  HTN  BP is elevated today   At home it is up and down  She didn't do well when on more meds    Near syncope and falling     She still feels a little shaky now   WIll recomm clonidine 0.1 mg to take for SBP over 165/ Keep log  Call if stays elevated or goes too low      3  SVT   Remote episode  No symptoms to sugg recurrence  4  HL    Follow   LDL in June 2023 was great at 69    Current medicines are reviewed at length  with the patient today.  The patient does not have concerns regarding medicines.  Signed, Dorris Carnes, MD  07/12/2022 9:25 PM  [facility] [address], [place], [place]  [address] Phone: [phone]; Fax: [phone]

## 2022-07-10 ENCOUNTER — Other Ambulatory Visit: Payer: Self-pay

## 2022-07-10 ENCOUNTER — Encounter: Payer: Self-pay | Admitting: Internal Medicine

## 2022-07-10 ENCOUNTER — Telehealth: Payer: Self-pay | Admitting: Internal Medicine

## 2022-07-10 ENCOUNTER — Ambulatory Visit: Payer: Medicare Other | Attending: Internal Medicine | Admitting: Internal Medicine

## 2022-07-10 VITALS — BP 140/80 | HR 67 | Ht 63.5 in | Wt 148.0 lb

## 2022-07-10 DIAGNOSIS — E1021 Type 1 diabetes mellitus with diabetic nephropathy: Secondary | ICD-10-CM | POA: Diagnosis not present

## 2022-07-10 DIAGNOSIS — R42 Dizziness and giddiness: Secondary | ICD-10-CM | POA: Diagnosis not present

## 2022-07-10 DIAGNOSIS — I471 Supraventricular tachycardia: Secondary | ICD-10-CM

## 2022-07-10 DIAGNOSIS — I10 Essential (primary) hypertension: Secondary | ICD-10-CM | POA: Diagnosis not present

## 2022-07-10 DIAGNOSIS — Z79899 Other long term (current) drug therapy: Secondary | ICD-10-CM | POA: Insufficient documentation

## 2022-07-10 MED ORDER — CLONIDINE HCL 0.1 MG PO TABS: ORAL_TABLET | ORAL | 3 refills | Status: AC

## 2022-07-10 MED ORDER — CLONIDINE HCL 0.1 MG PO TABS: ORAL_TABLET | ORAL | 3 refills | Status: DC

## 2022-07-10 NOTE — Patient Instructions (Addendum)
Medication Instructions:  Take Clonidine 0.1 mg as needed for BP on top greater than 165.  *If you need a refill on your cardiac medications before your next appointment, please call your pharmacy*  Labs today:   cmet, cbc, tsh, lipid, hgba1c   If you have labs (blood work) drawn today and your tests are completely normal, you will receive your results only by: [place] (if you have MyChart) OR A paper copy in the mail If you have any lab test that is abnormal or we need to change your treatment, we will call you to review the results.   Testing/Procedures:    Follow-Up: At [facility], you and your health needs are our priority.  As part of our continuing mission to provide you with exceptional heart care, we have created designated Provider Care Teams.  These Care Teams include your primary Cardiologist (physician) and Advanced Practice Providers (APPs -  Physician Assistants and Nurse Practitioners) who all work together to provide you with the care you need, when you need it.  We recommend signing up for the patient portal called "MyChart".  Sign up information is provided on this After Visit Summary.  MyChart is used to connect with patients for Virtual Visits (Telemedicine).  Patients are able to view lab/test results, encounter notes, upcoming appointments, etc.  Non-urgent messages can be sent to your provider as well.   To learn more about what you can do with MyChart, go to [email or web address].    Your next appointment:   6 month(s)  The format for your next appointment:   In Person  Provider:   Dorris Carnes, MD     Other Instructions   Important Information About Sugar

## 2022-07-10 NOTE — Telephone Encounter (Signed)
Pt c/o medication issue:  1. Name of Medication: cloNIDine (CATAPRES) 0.1 MG tablet  2. How are you currently taking this medication (dosage and times per day)? N/A  3. Are you having a reaction (difficulty breathing--STAT)? N/A  4. What is your medication issue? Pharmacy is calling wanting to confirm how many tablets a day patient is to take if systolic gets over 337. Please advise.

## 2022-07-10 NOTE — Telephone Encounter (Signed)
Spoke with the pharmacy and clarified her instructions.

## 2022-07-11 LAB — CBC
Hematocrit: 43.3 % (ref 34.0–46.6)
Hemoglobin: 14.3 g/dL (ref 11.1–15.9)
MCH: 30.9 pg (ref 26.6–33.0)
MCHC: 33 g/dL (ref 31.5–35.7)
MCV: 94 fL (ref 79–97)
Platelets: 298 10*3/uL (ref 150–450)
RBC: 4.63 x10E6/uL (ref 3.77–5.28)
RDW: 12.2 % (ref 11.7–15.4)
WBC: 9.1 10*3/uL (ref 3.4–10.8)

## 2022-07-11 LAB — COMPREHENSIVE METABOLIC PANEL
ALT: 18 IU/L (ref 0–32)
AST: 22 IU/L (ref 0–40)
Albumin/Globulin Ratio: 2 (ref 1.2–2.2)
Albumin: 4.7 g/dL — ABNORMAL HIGH (ref 3.6–4.6)
Alkaline Phosphatase: 61 IU/L (ref 44–121)
BUN/Creatinine Ratio: 18 (ref 12–28)
BUN: 20 mg/dL (ref 10–36)
Bilirubin Total: 0.5 mg/dL (ref 0.0–1.2)
CO2: 25 mmol/L (ref 20–29)
Calcium: 10.1 mg/dL (ref 8.7–10.3)
Chloride: 98 mmol/L (ref 96–106)
Creatinine, Ser: 1.12 mg/dL — ABNORMAL HIGH (ref 0.57–1.00)
Globulin, Total: 2.4 g/dL (ref 1.5–4.5)
Glucose: 175 mg/dL — ABNORMAL HIGH (ref 70–99)
Potassium: 4.7 mmol/L (ref 3.5–5.2)
Sodium: 141 mmol/L (ref 134–144)
Total Protein: 7.1 g/dL (ref 6.0–8.5)
eGFR: 46 mL/min/{1.73_m2} — ABNORMAL LOW (ref >59)

## 2022-07-11 LAB — LIPID PANEL
Chol/HDL Ratio: 2.3 ratio (ref 0.0–4.4)
Cholesterol, Total: 137 mg/dL (ref 100–199)
HDL: 59 mg/dL (ref >39)
LDL Chol Calc (NIH): 52 mg/dL (ref 0–99)
Triglycerides: 158 mg/dL — ABNORMAL HIGH (ref 0–149)
VLDL Cholesterol Cal: 26 mg/dL (ref 5–40)

## 2022-07-11 LAB — HEMOGLOBIN A1C
Est. average glucose Bld gHb Est-mCnc: 177 mg/dL
Hgb A1c MFr Bld: 7.8 % — ABNORMAL HIGH (ref 4.8–5.6)

## 2022-07-11 LAB — TSH: TSH: 3.68 u[IU]/mL (ref 0.450–4.500)

## 2022-07-13 ENCOUNTER — Other Ambulatory Visit (INDEPENDENT_AMBULATORY_CARE_PROVIDER_SITE_OTHER): Payer: Self-pay | Admitting: Ophthalmology

## 2022-07-14 ENCOUNTER — Other Ambulatory Visit (INDEPENDENT_AMBULATORY_CARE_PROVIDER_SITE_OTHER): Payer: Self-pay

## 2022-07-14 MED ORDER — PROLENSA 0.07 % OP SOLN: 1.0000 [drp] | Freq: Two times a day (BID) | OPHTHALMIC | 5 refills | Status: DC

## 2022-07-20 DIAGNOSIS — R059 Cough, unspecified: Secondary | ICD-10-CM | POA: Diagnosis not present

## 2022-07-20 DIAGNOSIS — J40 Bronchitis, not specified as acute or chronic: Secondary | ICD-10-CM | POA: Diagnosis not present

## 2022-07-20 DIAGNOSIS — R062 Wheezing: Secondary | ICD-10-CM | POA: Diagnosis not present

## 2022-07-25 NOTE — Progress Notes (Signed)
[facility] Note  08/04/2022     CHIEF COMPLAINT Patient presents for Retina Follow Up  HISTORY OF PRESENT ILLNESS: Nancy Webster is a 88 y.o. female who presents to the clinic today for:   HPI     Retina Follow Up   Patient presents with  CRVO/BRVO.  In left eye.  This started 4 weeks ago.  I, the attending physician,  performed the HPI with the patient and updated documentation appropriately.        Comments   Patient here for 4 weeks retina follow up for BRVO OS. Patient states vision sometimes thinks a little better. OS some days better than others. OD has had a little pain go through it. Was itchy in the corner. Used drops went away.      Last edited by Bernarda Caffey, MD on 08/04/2022 12:52 PM.    Pt states she had a large, black floater after the last injection and after it went away vision seemed to be better   Referring physician: Lajean Manes, MD [address],  [place] [address]  HISTORICAL INFORMATION:  Selected notes from the MEDICAL RECORD NUMBER Referred by Dr. Ellie Lunch for eval of ERM OU   CURRENT MEDICATIONS: Current Outpatient Medications (Ophthalmic Drugs)  Medication Sig   Bromfenac Sodium (PROLENSA) 0.07 % SOLN Place 1 drop into the left eye in the morning and at bedtime.   Polyethyl Glycol-Propyl Glycol (SYSTANE) 0.4-0.3 % SOLN Place 1 drop into both eyes 2 (two) times daily.   prednisoLONE acetate (PRED FORTE) 1 % ophthalmic suspension PLACE 1 DROP INTO LEFT EYE FOUR TIMES A DAY  *WAIT 3-5 MINUTES BETWEEN 2 EYE MEDS* *SHAKE WELL* *STORE UPRIGHT*   PROLENSA 0.07 % SOLN PLACE 1 DROP INTO THE LEFT EYE 4 (FOUR) TIMES DAILY.   PROLENSA 0.07 % SOLN PLACE 1 DROP INTO LEFT EYE FOUR TIMES DAILY   No current facility-administered medications for this visit. (Ophthalmic Drugs)   Current Outpatient Medications (Other)  Medication Sig   acetaminophen (TYLENOL) 500 MG tablet Take 500 mg by mouth every 8 (eight) hours as  needed for mild pain or headache.   Biotin 5000 MCG TABS Take 1 tablet by mouth daily.   Blood Glucose Monitoring Suppl (FREESTYLE LITE) DEVI    Cholecalciferol (VITAMIN D3) 1.25 MG (50000 UT) TABS Take 1 tablet by mouth daily.   cloNIDine (CATAPRES) 0.1 MG tablet Take one tablet (0.1 mg) by mouth as needed for BP on top greater than 165 up to one time a day.   diclofenac Sodium (VOLTAREN) 1 % GEL Apply topically.   fluticasone (FLONASE) 50 MCG/ACT nasal spray Place 1 spray into both nostrils daily.   furosemide (LASIX) 20 MG tablet TAKE 1/2 TABLET = 10 MG BY MOUTH ONCE DAILY   glucose blood (FREESTYLE LITE) test strip    MEGARED OMEGA-3 KRILL OIL PO Take 1 tablet by mouth daily.   metFORMIN (GLUCOPHAGE) 500 MG tablet Take 500 mg by mouth 2 (two) times daily.   metoprolol succinate (TOPROL XL) 25 MG 24 hr tablet Take 0.5 tablets (12.5 mg total) by mouth daily.   Multiple Vitamins-Minerals (CENTRUM SILVER ADULT 50+) TABS Take 1 tablet by mouth daily.   pantoprazole (PROTONIX) 40 MG tablet Take 40 mg by mouth daily.   REPATHA SURECLICK 726 MG/ML SOAJ Inject into the skin.   sodium chloride (OCEAN) 0.65 % SOLN nasal spray Place 1 spray into both nostrils as needed for congestion.  No current facility-administered medications for this visit. (Other)   REVIEW OF SYSTEMS: ROS   Positive for: Endocrine, Cardiovascular, Eyes Negative for: Constitutional, Gastrointestinal, Neurological, Skin, Genitourinary, Musculoskeletal, HENT, Respiratory, Psychiatric, Allergic/Imm, Heme/Lymph Last edited by Theodore Demark, COA on 08/04/2022  9:34 AM.     ALLERGIES Allergies  Allergen Reactions   Atorvastatin Other (See Comments)    UNKNOWN   Other Other (See Comments)    Cramps to all Cholesterol medications   Statins Other (See Comments)    "cramps" UNKNOWN   PAST MEDICAL HISTORY Past Medical History:  Diagnosis Date   Anxiety    Coronary artery disease    Diabetes mellitus without  complication ([place])    GERD (gastroesophageal reflux disease)    Hypertension    Renal disorder    stage 3   Past Surgical History:  Procedure Laterality Date   CATARACT EXTRACTION Bilateral    [place]   CHOLECYSTECTOMY N/A 03/02/2018   Procedure: LAPAROSCOPIC CHOLECYSTECTOMY;  Surgeon: Greer Pickerel, MD;  Location: [facility];  Service: General;  Laterality: N/A;   ERCP N/A 06/26/2019   Procedure: ENDOSCOPIC RETROGRADE CHOLANGIOPANCREATOGRAPHY (ERCP);  Surgeon: [name], MD;  Location: [facility];  Service: Gastroenterology;  Laterality: N/A;   SPHINCTEROTOMY  06/26/2019   Procedure: SPHINCTEROTOMY;  Surgeon: [name], MD;  Location: [facility] ENDOSCOPY;  Service: Gastroenterology;;   FAMILY HISTORY Family History  Problem Relation Age of Onset   Heart failure Mother    Diabetes Daughter    SOCIAL HISTORY Social History   Tobacco Use   Smoking status: Never   Smokeless tobacco: Never  Vaping Use   Vaping Use: Never used  Substance Use Topics   Alcohol use: Not Currently   Drug use: Never       OPHTHALMIC EXAM: Base Eye Exam     Visual Acuity (Snellen - Linear)       Right Left   Dist [place] 20/30 -1 20/150   Dist ph [place] NI NI         Tonometry (Tonopen, 9:31 AM)       Right Left   Pressure 15 18         Pupils       Dark Light Shape React APD   Right 2 1 Round Slow None   Left 2 1 Round Slow None         Visual Fields (Counting fingers)       Left Right    Full Full         Extraocular Movement       Right Left    Full, Ortho Full, Ortho         Neuro/Psych     Oriented x3: Yes   Mood/Affect: Normal         Dilation     Both eyes: 1.0% Mydriacyl, 2.5% Phenylephrine @ 9:30 AM           Slit Lamp and Fundus Exam     Slit Lamp Exam       Right Left   Lids/Lashes Dermatochalasis - upper lid, mild MGD Dermatochalasis - upper lid, mild MGD   Conjunctiva/Sclera White and quiet 360 subconj heme, STK ST quad   Cornea well healed  cataract wound, mild arcus Trace PEE, well healed cataract wound, mild endo pigment superiorly   Anterior Chamber Deep and quiet deep and clear, no cell or flare   Iris Round and moderately dilated Round and moderately dilated, focal atrophy and TID at  0300   Lens PC IOL in good position with open PC PC IOL in good position with open PC   Anterior Vitreous Vitreous syneresis, Posterior vitreous detachment, vitreous condensations Vitreous syneresis, Posterior vitreous detachment         Fundus Exam       Right Left   Disc mild Pallor, Sharp rim, PPA mild Pallor, mild temporal PPA, mild cupping, disc heme superiorly -- improved, sharp rim, tilted   C/D Ratio 0.5 0.65   Macula Flat, Blunted foveal reflex, mild ERM, RPE mottling, No heme or edema Flat, Blunted foveal reflex, ERM with +cystic changes -- slightly improved, no heme   Vessels attenuated, Tortuous attenuated, Tortuous   Periphery Attached, blonde fundus Attached, blonde fundus, persistent punctate MA/IRH/SRH superior midzone along superior venule -- improved - BRVO, no edema           Refraction     Wearing Rx       Sphere Cylinder Axis   Right +0.75 Sphere    Left -1.00 +1.00 022           IMAGING AND PROCEDURES  Imaging and Procedures for 08/04/2022  OCT, Retina - OU - Both Eyes       Right Eye Quality was good. Central Foveal Thickness: 398. Progression has been stable. Findings include no IRF, no SRF, abnormal foveal contour, retinal drusen , epiretinal membrane, vitreomacular adhesion (ERM with Blunted foveal contour and central thickening ).   Left Eye Quality was good. Central Foveal Thickness: 430. Progression has improved. Findings include no SRF, abnormal foveal contour, retinal drusen , epiretinal membrane, intraretinal fluid, vitreomacular adhesion (interval improvement in IRF inferior fovea, mild interval increase in ERM thickness).   Notes *Images captured and stored on drive  Diagnosis /  Impression:  ERM OU  OD: ERM with Blunted foveal contour and central thickening  OS: interval improvement in IRF inferior fovea, mild interval increase in ERM thickness  Clinical management:  See below  Abbreviations: NFP - Normal foveal profile. CME - cystoid macular edema. PED - pigment epithelial detachment. IRF - intraretinal fluid. SRF - subretinal fluid. EZ - ellipsoid zone. ERM - epiretinal membrane. ORA - outer retinal atrophy. ORT - outer retinal tubulation. SRHM - subretinal hyper-reflective material. IRHM - intraretinal hyper-reflective material      Intravitreal Injection, Pharmacologic Agent - OS - Left Eye       Time Out 08/04/2022. 9:51 AM. Confirmed correct patient, procedure, site, and patient consented.   Anesthesia Topical anesthesia was used. Anesthetic medications included Lidocaine 2%, Proparacaine 0.5%.   Procedure Preparation included 5% betadine to ocular surface, eyelid speculum. A (32g) needle was used.   Injection: 2 mg aflibercept 2 MG/0.05ML   Route: Intravitreal, Site: Left Eye   NDC: [id number], Lot: [id number], Expiration date: 09/13/2022, Waste: 0 mL   Post-op Post injection exam found visual acuity of at least counting fingers. The patient tolerated the procedure well. There were no complications. The patient received written and verbal post procedure care education.            ASSESSMENT/PLAN:    ICD-10-CM   1. Epiretinal membrane (ERM) of both eyes  H35.373 OCT, Retina - OU - Both Eyes    2. Cystoid macular edema of left eye  H35.352 OCT, Retina - OU - Both Eyes    3. Essential hypertension  I10     4. Hypertensive retinopathy of both eyes  H35.033     5. Branch retinal vein occlusion of  left eye with macular edema  H34.8320 OCT, Retina - OU - Both Eyes    Intravitreal Injection, Pharmacologic Agent - OS - Left Eye    aflibercept (EYLEA) SOLN 2 mg    6. Pseudophakia, both eyes  Z96.1      1,2. Epiretinal membrane, both eyes   - +ERM w/ blunting of foveal contour OU -- OS with +IRF / persistent cystic changes - FA (11.01.22) shows mild perifoveal staining / leakage OS suggestive of CME component - started on PF and Prolensa QID OS on 11.1.22 - s/p STK OS #1 (12.20.22) - BCVA OD: 20/30; OS: 20/100 (stable) - OCT shows OD: ERM with Blunted foveal contour and central thickening; OS: interval improvemet in central cystic changes, ERM with central thickening  - FA (06.06.23) shows OD: Mild, late peripapillary staining; OS: Delayed venous return of superior venule, punctate perivascular blockage along superior venule -- superior BRVO outside of macula  - cont PF and Prolensa BID OS  - STK consent signed 12.20.22  - pt wishes to hold off on surgery  3,4. Hypertensive retinopathy OU  - BP in office 6.6.23 was 171/73 - discussed importance of tight BP control - monitor   5. BRVO OS  - s/p IVA OS #1 (08.10.23), #2 (09.07.23) - persistent retinal hemorrhages along superior venule, superior to disc - OCT shows interval improvement in IRF inferior fovea, mild interval increase in ERM thickness - FA 6.6.23 shows delayed venous return through superior venule suggestive of BRVO - pt also reports history of recent TIA-like episode (telephone note on 4.12.23) -- pt experienced transient visual field loss and blurred vision that resolved over several minutes  - BP at last visit (05/05/22) -- 171/73 - all of these factors raise concern / increased risk of possible cardiovascular compromise, I.e. CVA, MI, etc. - discussed findings and case with pt's PCP -- Dr. 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 Pseudophakia OU  - s/p CE/IOL (Dr. Vilinda Blanks)  - IOLs in good position, doing well  - monitor   Ophthalmic Meds Ordered this visit:  Meds ordered this encounter  Medications   aflibercept (EYLEA) SOLN 2 mg     Return in about 4 weeks (around 09/01/2022) for f/u BRVO OS, DFE, OCT.  There are no Patient Instructions on file for this visit.  Explained the diagnoses, plan, and follow up with the patient and they expressed understanding.  Patient expressed understanding of the importance of proper follow up care.   This document serves as a record of services personally performed by Gardiner Sleeper, MD, PhD. It was created on their behalf by San Jetty. Owens Shark, OA an ophthalmic technician. The creation of this record is the provider's dictation and/or activities during the visit.    Electronically signed by: San Jetty. Owens Shark, [place] 09.26.2023 12:54 PM   Gardiner Sleeper, M.D., Ph.D. Diseases & Surgery of the Retina and Vitreous [facility]  I have reviewed the above documentation  for accuracy and completeness, and I agree with the above. Gardiner Sleeper, M.D., Ph.D. 08/04/22 12:55 PM   Abbreviations: M myopia (nearsighted); A astigmatism; H hyperopia (farsighted); P presbyopia; Mrx spectacle prescription;  CTL contact lenses; OD right eye; OS left eye; OU both eyes  XT exotropia; ET esotropia; PEK punctate epithelial keratitis; PEE punctate epithelial erosions; DES dry eye syndrome; MGD meibomian gland dysfunction; ATs artificial tears; PFAT's preservative free artificial tears; [place] nuclear sclerotic cataract; PSC posterior subcapsular cataract; ERM epi-retinal membrane; PVD posterior vitreous detachment; RD retinal detachment; DM diabetes mellitus; DR diabetic retinopathy; NPDR non-proliferative  diabetic retinopathy; PDR proliferative diabetic retinopathy; CSME clinically significant macular edema; DME diabetic macular edema; dbh dot blot hemorrhages; CWS cotton wool spot; POAG primary open angle glaucoma; C/D cup-to-disc ratio; HVF humphrey visual field; GVF goldmann visual field; OCT optical coherence tomography; IOP intraocular pressure; BRVO Branch retinal vein occlusion; CRVO central retinal vein occlusion; CRAO central retinal artery occlusion; BRAO branch retinal artery occlusion; RT retinal tear; SB scleral buckle; PPV pars plana vitrectomy; VH Vitreous hemorrhage; PRP panretinal laser photocoagulation; IVK intravitreal kenalog; VMT vitreomacular traction; MH Macular hole;  NVD neovascularization of the disc; NVE neovascularization elsewhere; AREDS age related eye disease study; ARMD age related macular degeneration; POAG primary open angle glaucoma; EBMD epithelial/anterior basement membrane dystrophy; ACIOL anterior chamber intraocular lens; IOL intraocular lens; PCIOL posterior chamber intraocular lens; Phaco/IOL phacoemulsification with intraocular lens placement; [place] photorefractive keratectomy; LASIK laser assisted in situ keratomileusis; HTN hypertension; DM diabetes mellitus; COPD chronic obstructive pulmonary disease

## 2022-08-04 ENCOUNTER — Ambulatory Visit (INDEPENDENT_AMBULATORY_CARE_PROVIDER_SITE_OTHER): Payer: Medicare Other | Admitting: Ophthalmology

## 2022-08-04 ENCOUNTER — Encounter (INDEPENDENT_AMBULATORY_CARE_PROVIDER_SITE_OTHER): Payer: Self-pay | Admitting: Ophthalmology

## 2022-08-04 DIAGNOSIS — H34832 Tributary (branch) retinal vein occlusion, left eye, with macular edema: Secondary | ICD-10-CM

## 2022-08-04 DIAGNOSIS — H35033 Hypertensive retinopathy, bilateral: Secondary | ICD-10-CM

## 2022-08-04 DIAGNOSIS — H35352 Cystoid macular degeneration, left eye: Secondary | ICD-10-CM

## 2022-08-04 DIAGNOSIS — H35373 Puckering of macula, bilateral: Secondary | ICD-10-CM

## 2022-08-04 DIAGNOSIS — Z961 Presence of intraocular lens: Secondary | ICD-10-CM | POA: Diagnosis not present

## 2022-08-04 DIAGNOSIS — Z23 Encounter for immunization: Secondary | ICD-10-CM | POA: Diagnosis not present

## 2022-08-04 DIAGNOSIS — I10 Essential (primary) hypertension: Secondary | ICD-10-CM

## 2022-08-04 MED ORDER — AFLIBERCEPT 2MG/0.05ML IZ SOLN FOR KALEIDOSCOPE
2.0000 mg | INTRAVITREAL | Status: AC | PRN
  Administered 2022-08-04: 2 mg via INTRAVITREAL

## 2022-08-14 DIAGNOSIS — H9313 Tinnitus, bilateral: Secondary | ICD-10-CM | POA: Diagnosis not present

## 2022-08-30 NOTE — Progress Notes (Addendum)
[facility] Note  09/04/2022     CHIEF COMPLAINT Patient presents for Retina Follow Up  HISTORY OF PRESENT ILLNESS: Nancy Webster is a 88 y.o. female who presents to the clinic today for:   HPI     Retina Follow Up   Patient presents with  CRVO/BRVO.  In left eye.  This started 4 weeks ago.  I, the attending physician,  performed the HPI with the patient and updated documentation appropriately.        Comments   Patient here for 4 weeks retina follow up for BRVO OS. Patient states vision about the same. Is it ever going to get better?  On occasion has a little jab of pain.       Last edited by Bernarda Caffey, MD on 09/04/2022 12:51 PM.    Pt states she's concerned about her VA, doesn't seem to be improving.   Referring physician: Luberta Mutter, MD [place],  [place] [address]  HISTORICAL INFORMATION:  Selected notes from the MEDICAL RECORD NUMBER Referred by Dr. Ellie Lunch for eval of ERM OU   CURRENT MEDICATIONS: Current Outpatient Medications (Ophthalmic Drugs)  Medication Sig   Bromfenac Sodium (PROLENSA) 0.07 % SOLN Place 1 drop into the left eye in the morning and at bedtime.   Polyethyl Glycol-Propyl Glycol (SYSTANE) 0.4-0.3 % SOLN Place 1 drop into both eyes 2 (two) times daily.   prednisoLONE acetate (PRED FORTE) 1 % ophthalmic suspension PLACE 1 DROP INTO LEFT EYE FOUR TIMES A DAY  *WAIT 3-5 MINUTES BETWEEN 2 EYE MEDS* *SHA[facility]* *STORE UPRIGHT*   PROLENSA 0.07 % SOLN PLACE 1 DROP INTO THE LEFT EYE 4 (FOUR) TIMES DAILY.   PROLENSA 0.07 % SOLN PLACE 1 DROP INTO LEFT EYE FOUR TIMES DAILY   No current facility-administered medications for this visit. (Ophthalmic Drugs)   Current Outpatient Medications (Other)  Medication Sig   acetaminophen (TYLENOL) 500 MG tablet Take 500 mg by mouth every 8 (eight) hours as needed for mild pain or headache.   Biotin 5000 MCG TABS Take 1 tablet by mouth daily.   Blood Glucose  Monitoring Suppl (FREESTYLE LITE) DEVI    Cholecalciferol (VITAMIN D3) 1.25 MG (50000 UT) TABS Take 1 tablet by mouth daily.   cloNIDine (CATAPRES) 0.1 MG tablet Take one tablet (0.1 mg) by mouth as needed for BP on top greater than 165 up to one time a day.   diclofenac Sodium (VOLTAREN) 1 % GEL Apply topically.   fluticasone (FLONASE) 50 MCG/ACT nasal spray Place 1 spray into both nostrils daily.   furosemide (LASIX) 20 MG tablet TAKE 1/2 TABLET = 10 MG BY MOUTH ONCE DAILY   glucose blood (FREESTYLE LITE) test strip    MEGARED OMEGA-3 KRILL OIL PO Take 1 tablet by mouth daily.   metFORMIN (GLUCOPHAGE) 500 MG tablet Take 500 mg by mouth 2 (two) times daily.   metoprolol succinate (TOPROL XL) 25 MG 24 hr tablet Take 0.5 tablets (12.5 mg total) by mouth daily.   Multiple Vitamins-Minerals (CENTRUM SILVER ADULT 50+) TABS Take 1 tablet by mouth daily.   pantoprazole (PROTONIX) 40 MG tablet Take 40 mg by mouth daily.   REPATHA SURECLICK 053 MG/ML SOAJ Inject into the skin.   sodium chloride (OCEAN) 0.65 % SOLN nasal spray Place 1 spray into both nostrils as needed for congestion.   No current facility-administered medications for this visit. (Other)   REVIEW OF SYSTEMS: ROS   Positive  for: Endocrine, Cardiovascular, Eyes Negative for: Constitutional, Gastrointestinal, Neurological, Skin, Genitourinary, Musculoskeletal, HENT, Respiratory, Psychiatric, Allergic/Imm, Heme/Lymph Last edited by Theodore Demark, COA on 09/04/2022  9:51 AM.     ALLERGIES Allergies  Allergen Reactions   Atorvastatin Other (See Comments)    UNKNOWN   Other Other (See Comments)    Cramps to all Cholesterol medications   Statins Other (See Comments)    "cramps" UNKNOWN   PAST MEDICAL HISTORY Past Medical History:  Diagnosis Date   Anxiety    Coronary artery disease    Diabetes mellitus without complication ([facility])    GERD (gastroesophageal reflux disease)    Hypertension    Renal disorder    stage 3    Past Surgical History:  Procedure Laterality Date   CATARACT EXTRACTION Bilateral    [place]   CHOLECYSTECTOMY N/A 03/02/2018   Procedure: LAPAROSCOPIC CHOLECYSTECTOMY;  Surgeon: Greer Pickerel, MD;  Location: [facility];  Service: General;  Laterality: N/A;   ERCP N/A 06/26/2019   Procedure: ENDOSCOPIC RETROGRADE CHOLANGIOPANCREATOGRAPHY (ERCP);  Surgeon: [name], MD;  Location: [facility];  Service: Gastroenterology;  Laterality: N/A;   SPHINCTEROTOMY  06/26/2019   Procedure: SPHINCTEROTOMY;  Surgeon: [name], MD;  Location[facility] ENDOSCOPY;  Service: Gastroenterology;;   FAMILY HISTORY Family History  Problem Relation Age of Onset   Heart failure Mother    Diabetes Daughter    SOCIAL HISTORY Social History   Tobacco Use   Smoking status: Never   Smokeless tobacco: Never  Vaping Use   Vaping Use: Never used  Substance Use Topics   Alcohol use: Not Currently   Drug use: Never       OPHTHALMIC EXAM: Base Eye Exam     Visual Acuity (Snellen - Linear)       Right Left   Dist [place] 20/30 -2 20/150 -2   Dist ph  NI NI         Tonometry (Tonopen, 9:48 AM)       Right Left   Pressure 14 19         Pupils       Dark Light Shape React APD   Right 2 1 Round Slow None   Left 2 1 Round Slow None         Visual Fields (Counting fingers)       Left Right    Full Full         Extraocular Movement       Right Left    Full, Ortho Full, Ortho         Neuro/Psych     Oriented x3: Yes   Mood/Affect: Normal         Dilation     Both eyes: 1.0% Mydriacyl, 2.5% Phenylephrine @ 9:48 AM           Slit Lamp and Fundus Exam     Slit Lamp Exam       Right Left   Lids/Lashes Dermatochalasis - upper lid, mild MGD Dermatochalasis - upper lid, mild MGD   Conjunctiva/Sclera White and quiet 360 subconj heme, STK ST quad   Cornea well healed cataract wound, mild arcus Trace PEE, well healed cataract wound, mild endo pigment superiorly   Anterior  Chamber Deep and quiet deep and clear, no cell or flare   Iris Round and moderately dilated Round and moderately dilated, focal atrophy and TID at 0300   Lens PC IOL in good position with open PC PC IOL in good  position with open PC   Anterior Vitreous Vitreous syneresis, Posterior vitreous detachment, vitreous condensations Vitreous syneresis, Posterior vitreous detachment         Fundus Exam       Right Left   Disc mild Pallor, Sharp rim, PPA mild Pallor, mild temporal PPA, mild cupping, disc heme superiorly -- improved, sharp rim, tilted   C/D Ratio 0.5 0.65   Macula Flat, Blunted foveal reflex, mild ERM, RPE mottling, No heme or edema Flat, Blunted foveal reflex, ERM with +cystic changes -- persistent, no heme.   Vessels attenuated, Tortuous attenuated, Tortuous   Periphery Attached, blonde fundus Attached, blonde fundus, persistent punctate MA/IRH/SRH superior midzone along superior venule -- slightly improved - BRVO, no edema           Refraction     Wearing Rx       Sphere Cylinder Axis   Right +0.75 Sphere    Left -1.00 +1.00 022           IMAGING AND PROCEDURES  Imaging and Procedures for 09/04/2022  OCT, Retina - OU - Both Eyes       Right Eye Quality was good. Central Foveal Thickness: 390. Progression has been stable. Findings include no IRF, no SRF, abnormal foveal contour, retinal drusen , epiretinal membrane, macular pucker, vitreomacular adhesion (ERM with Blunted foveal contour and central thickening ).   Left Eye Quality was good. Central Foveal Thickness: 446. Progression has been stable. Findings include no SRF, abnormal foveal contour, retinal drusen , epiretinal membrane, intraretinal fluid, vitreomacular adhesion (Persistent IRF inferior fovea and macula, mild interval increase in ERM thickness).   Notes *Images captured and stored on drive  Diagnosis / Impression:  ERM OU  OD: ERM with Blunted foveal contour and central thickening  OS:  Persistent IRF inferior fovea and macula, mild interval increase in ERM thickness  Clinical management:  See below  Abbreviations: NFP - Normal foveal profile. CME - cystoid macular edema. PED - pigment epithelial detachment. IRF - intraretinal fluid. SRF - subretinal fluid. EZ - ellipsoid zone. ERM - epiretinal membrane. ORA - outer retinal atrophy. ORT - outer retinal tubulation. SRHM - subretinal hyper-reflective material. IRHM - intraretinal hyper-reflective material      Intravitreal Injection, Pharmacologic Agent - OS - Left Eye       Time Out 09/04/2022. 10:53 AM. Confirmed correct patient, procedure, site, and patient consented.   Anesthesia Topical anesthesia was used. Anesthetic medications included Lidocaine 2%, Proparacaine 0.5%.   Procedure Preparation included 5% betadine to ocular surface, eyelid speculum. A (32g) needle was used.   Injection: 1.25 mg Bevacizumab 1.'25mg'$ /0.74m   Route: Intravitreal, Site: Left Eye   NDC: [id number] Lot:: [id number] Expiration date: 09/27/2022   Post-op Post injection exam found visual acuity of at least counting fingers. The patient tolerated the procedure well. There were no complications. The patient received written and verbal post procedure care education.            ASSESSMENT/PLAN:    ICD-10-CM   1. Epiretinal membrane (ERM) of both eyes  H35.373 OCT, Retina - OU - Both Eyes    2. Cystoid macular edema of left eye  H35.352     3. Essential hypertension  I10     4. Hypertensive retinopathy of both eyes  H35.033     5. Branch retinal vein occlusion of left eye with macular edema  H34.8320 Intravitreal Injection, Pharmacologic Agent - OS - Left Eye    Bevacizumab (AVASTIN) SOLN 1.25  mg    6. Pseudophakia, both eyes  Z96.1       1,2. Epiretinal membrane, both eyes  - +ERM w/ blunting of foveal contour OU -- OS with +IRF / persistent cystic changes - FA (11.01.22) shows mild perifoveal staining / leakage OS  suggestive of CME component - started on PF and Prolensa QID OS on 11.1.22 - s/p STK OS #1 (12.20.22) - BCVA OD: 20/30; OS: 20/100 (stable) - OCT shows OD: ERM with Blunted foveal contour and central thickening; OS: Persistent IRF inferior fovea and macula, mild interval increase in ERM thickness - FA (06.06.23) shows OD: Mild, late peripapillary staining; OS: Delayed venous return of superior venule, punctate perivascular blockage along superior venule -- superior BRVO outside of macula  - cont PF and Prolensa BID OS  - STK consent signed 12.20.22  - pt wishes to hold off on surgery  3,4. Hypertensive retinopathy OU  - BP in office 6.6.23 was 171/73 - discussed importance of tight BP control - monitor  5. BRVO OS  - s/p IVA OS #1 (08.10.23), #2 (09.07.23), #3 (10.06.23) - persistent retinal hemorrhages along superior venule, superior to disc - FA 6.6.23 shows delayed venous return through superior venule suggestive of BRVO - pt also reports history of recent TIA-like episode (telephone note on 4.12.23) -- pt experienced transient visual field loss and blurred vision that resolved over several minutes  - BP at last visit (05/05/22) -- 171/73 - all of these factors raise concern / increased risk of possible cardiovascular compromise, I.e. CVA, MI, etc. - discussed findings and case with pt's PCP -- Dr. 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  - pt wishes to proceed with injection - RBA of procedure discussed, questions answered - IVA informed consent obtained and signed, 08.10.23 (OS) - see procedure note - f/u in 5 wks, sooner prn -- DFE/OCT  6. Pseudophakia OU  - s/p  CE/IOL (Dr. Vilinda Blanks)  - IOLs in good position, doing well  - monitor  Ophthalmic Meds Ordered this visit:  Meds ordered this encounter  Medications   Bevacizumab (AVASTIN) SOLN 1.25 mg     Return in about 5 weeks (around 10/09/2022) for BRVO OS, ERM OU, DFE, OCT .  There are no Patient Instructions on file for this visit.  Explained the diagnoses, plan, and follow up with the patient and they expressed understanding.  Patient expressed understanding of the importance of proper follow up care.   This document serves as a record of services personally performed by Gardiner Sleeper, MD, PhD. It was created on their behalf by Roselee Nova, COMT. The creation of this record is the provider's dictation and/or activities during the visit.  Electronically signed by: Roselee Nova, COMT 09/04/22 1:00 PM  This document serves as a record of services personally performed by Gardiner Sleeper, MD, PhD. It was created on their behalf by Orvan Falconer, an ophthalmic technician. The creation of this record is the provider's dictation and/or activities during the visit.    Electronically signed by: Orvan Falconer, OA, 09/04/22  1:00 PM  Gardiner Sleeper, M.D., Ph.D. Diseases & Surgery of the Retina and Vitreous [facility]  I have reviewed the above documentation for accuracy and completeness, and I agree with the above. Gardiner Sleeper, M.D., Ph.D. 09/04/22 1:00 PM  Abbreviations: M myopia (nearsighted); A astigmatism; H hyperopia (farsighted); P presbyopia; Mrx spectacle prescription;  CTL contact lenses; OD right eye; OS left eye; OU both eyes  XT exotropia; ET esotropia; PEK punctate epithelial keratitis; PEE punctate epithelial erosions; DES dry eye syndrome; MGD meibomian gland dysfunction; ATs artificial tears; PFAT's preservative free artificial tears; Banks nuclear sclerotic cataract; PSC posterior subcapsular cataract; ERM epi-retinal membrane; PVD posterior vitreous  detachment; RD retinal detachment; DM diabetes mellitus; DR diabetic retinopathy; NPDR non-proliferative diabetic retinopathy; PDR proliferative diabetic retinopathy; CSME clinically significant macular edema; DME diabetic macular edema; dbh dot blot hemorrhages; CWS cotton wool spot; POAG primary open angle glaucoma; C/D cup-to-disc ratio; HVF humphrey visual field; GVF goldmann visual field; OCT optical coherence tomography; IOP intraocular pressure; BRVO Branch retinal vein occlusion; CRVO central retinal vein occlusion; CRAO central retinal artery occlusion; BRAO branch retinal artery occlusion; RT retinal tear; SB scleral buckle; PPV pars plana vitrectomy; VH Vitreous hemorrhage; PRP panretinal laser photocoagulation; IVK intravitreal kenalog; VMT vitreomacular traction; MH Macular hole;  NVD neovascularization of the disc; NVE neovascularization elsewhere; AREDS age related eye disease study; ARMD age related macular degeneration; POAG primary open angle glaucoma; EBMD epithelial/anterior basement membrane dystrophy; ACIOL anterior chamber intraocular lens; IOL intraocular lens; PCIOL posterior chamber intraocular lens; Phaco/IOL phacoemulsification with intraocular lens placement; [place] photorefractive keratectomy; LASIK laser assisted in situ keratomileusis; HTN hypertension; DM diabetes mellitus; COPD chronic obstructive pulmonary disease

## 2022-09-04 ENCOUNTER — Encounter (INDEPENDENT_AMBULATORY_CARE_PROVIDER_SITE_OTHER): Payer: Self-pay | Admitting: Ophthalmology

## 2022-09-04 ENCOUNTER — Ambulatory Visit (INDEPENDENT_AMBULATORY_CARE_PROVIDER_SITE_OTHER): Payer: Medicare Other | Admitting: Ophthalmology

## 2022-09-04 DIAGNOSIS — H35033 Hypertensive retinopathy, bilateral: Secondary | ICD-10-CM

## 2022-09-04 DIAGNOSIS — H34832 Tributary (branch) retinal vein occlusion, left eye, with macular edema: Secondary | ICD-10-CM | POA: Diagnosis not present

## 2022-09-04 DIAGNOSIS — I10 Essential (primary) hypertension: Secondary | ICD-10-CM | POA: Diagnosis not present

## 2022-09-04 DIAGNOSIS — H35373 Puckering of macula, bilateral: Secondary | ICD-10-CM | POA: Diagnosis not present

## 2022-09-04 DIAGNOSIS — H35352 Cystoid macular degeneration, left eye: Secondary | ICD-10-CM | POA: Diagnosis not present

## 2022-09-04 DIAGNOSIS — Z961 Presence of intraocular lens: Secondary | ICD-10-CM

## 2022-09-04 MED ORDER — BEVACIZUMAB CHEMO INJECTION 1.25MG/0.05ML SYRINGE FOR KALEIDOSCOPE
1.2500 mg | INTRAVITREAL | Status: AC | PRN
  Administered 2022-09-04: 1.25 mg via INTRAVITREAL

## 2022-09-14 ENCOUNTER — Ambulatory Visit (INDEPENDENT_AMBULATORY_CARE_PROVIDER_SITE_OTHER): Payer: Medicare Other | Admitting: Internal Medicine

## 2022-09-14 VITALS — BP 128/72 | HR 68 | Temp 97.7°F | Resp 18 | Ht 63.0 in | Wt 148.8 lb

## 2022-09-14 DIAGNOSIS — J40 Bronchitis, not specified as acute or chronic: Secondary | ICD-10-CM

## 2022-09-14 DIAGNOSIS — J0100 Acute maxillary sinusitis, unspecified: Secondary | ICD-10-CM | POA: Diagnosis not present

## 2022-09-14 MED ORDER — DOXYCYCLINE MONOHYDRATE 100 MG PO CAPS: 100.0000 mg | ORAL_CAPSULE | Freq: Two times a day (BID) | ORAL | 0 refills | Status: AC

## 2022-09-14 MED ORDER — FLUTICASONE PROPIONATE 50 MCG/ACT NA SUSP: 1.0000 | Freq: Two times a day (BID) | NASAL | 2 refills | Status: DC

## 2022-09-14 NOTE — Progress Notes (Signed)
Office Visit  Subjective   Patient ID: Nancy Webster   DOB: [birth year]   Age: 88 y.o.   MRN: [id number]   Chief Complaint Chief Complaint  Patient presents with   Hoarse   Diabetes     History of Present Illness The patient is a 88 year old Caucasian/White female who returns for a follow-up visit for her diabetes. Since the last visit, there have been no problems. She remains on metformin 500 mg BID. She is not walking as much as they would like.. She specifically denies unexplained abdominal pain, nausea or vomiting and documented hypoglycemia. She checks blood sugars at least 2-3 times per week and they tend to range somewhere between 100 and 150 mg/dl fasting. She came in fasting today in anticipation of lab work. Her last HgbA1c was done on 03/03/2022 and was 7.3%.  She does not have any diabetic retinopathy, nephropathy or neuropathy.  Dr. Reesa Chew tried her on farxiga this past year but she stopped this due to her developing a yeast infection.  Her last dilated eye exam was done by her retinal specialist on 09/04/2022 and there was no evidence of diabetic retinopathy.  She does have epiretinal membranes and a history of hypertensive retinopathy.  The patient is a 88 year old Caucasian/White female who presents with upper respiratory tract symptoms which began 1 day ago. She complains of nasal congestion, sore throat, with yellow nasal drainage as well chest congestion with cough productive of yellow/gray sputum.  She denies any other problems.. She denies body aches, fever, headache, shortness of breath, nausea, vomiting, diarrhea, wheezing, and chills. Alleviating factors: none. She has tried apple cider vinegar without relief.  The patient's past medical history is non-contributory.  She states that the nurses at [facility] did test her for a rapid COVID-19 antigen test which was negative yesterday.   Diabetes Pertinent negatives for hypoglycemia include no dizziness or headaches. Pertinent  negatives for diabetes include no chest pain and no weakness.     Past Medical History Past Medical History:  Diagnosis Date   Anxiety    Coronary artery disease    Diabetes mellitus without complication ([facility])    GERD (gastroesophageal reflux disease)    Hypertension    Renal disorder    stage 3     Allergies Allergies  Allergen Reactions   Other Other (See Comments)    Cramps to all Cholesterol medications   Statins Other (See Comments)    "cramps" UNKNOWN   Atorvastatin Other (See Comments)    Muscle cramps     Review of Systems Review of Systems  Constitutional:  Negative for chills and fever.  Respiratory:  Positive for cough and sputum production. Negative for wheezing.   Cardiovascular:  Negative for chest pain and palpitations.  Gastrointestinal:  Negative for abdominal pain, diarrhea, nausea and vomiting.  Neurological:  Negative for dizziness, weakness and headaches.       Objective:    Vitals BP 128/72 (BP Location: Left Arm, Patient Position: Sitting, Cuff Size: Normal)   Pulse 68   Temp 97.7 F (36.5 C) (Temporal)   Resp 18   Ht '5\' 3"'$  (1.6 m)   Wt 148 lb 12.8 oz (67.5 kg)   SpO2 97%   BMI 26.36 kg/m    Physical Examination Physical Exam Constitutional:      Appearance: Normal appearance. She is not ill-appearing.  HENT:     Right Ear: Tympanic membrane, ear canal and external ear normal.  Left Ear: Tympanic membrane, ear canal and external ear normal.     Nose: Congestion and rhinorrhea present.     Mouth/Throat:     Mouth: Mucous membranes are moist.     Pharynx: Oropharynx is clear. No posterior oropharyngeal erythema.  Cardiovascular:     Rate and Rhythm: Normal rate and regular rhythm.     Pulses: Normal pulses.     Heart sounds: No murmur heard.    No friction rub. No gallop.  Pulmonary:     Effort: Pulmonary effort is normal. No respiratory distress.     Breath sounds: Normal breath sounds. No wheezing, rhonchi or rales.   Abdominal:     General: Abdomen is flat. Bowel sounds are normal. There is no distension.     Palpations: Abdomen is soft.     Tenderness: There is no abdominal tenderness.  Musculoskeletal:     Right lower leg: No edema.     Left lower leg: No edema.  Skin:    General: Skin is warm and dry.     Findings: No rash.  Neurological:     Mental Status: She is alert.        Assessment & Plan:   No problem-specific Assessment & Plan notes found for this encounter.    No follow-ups on file.   Townsend Roger, MD

## 2022-09-14 NOTE — Assessment & Plan Note (Signed)
She has bronchitis and sinus symptoms.  We will start her on an antibiotic and flonase.  Her COVID-19 testing yestrerday was normal.  We will continue suportive care.

## 2022-09-29 DIAGNOSIS — Z961 Presence of intraocular lens: Secondary | ICD-10-CM | POA: Diagnosis not present

## 2022-09-29 DIAGNOSIS — E119 Type 2 diabetes mellitus without complications: Secondary | ICD-10-CM | POA: Diagnosis not present

## 2022-09-29 DIAGNOSIS — H35352 Cystoid macular degeneration, left eye: Secondary | ICD-10-CM | POA: Diagnosis not present

## 2022-09-29 DIAGNOSIS — H35373 Puckering of macula, bilateral: Secondary | ICD-10-CM | POA: Diagnosis not present

## 2022-09-29 DIAGNOSIS — H349 Unspecified retinal vascular occlusion: Secondary | ICD-10-CM | POA: Diagnosis not present

## 2022-10-03 ENCOUNTER — Other Ambulatory Visit: Payer: Self-pay

## 2022-10-03 ENCOUNTER — Telehealth: Payer: Self-pay | Admitting: Internal Medicine

## 2022-10-03 MED ORDER — REPATHA SURECLICK 140 MG/ML ~~LOC~~ SOAJ: SUBCUTANEOUS | 6 refills | Status: DC

## 2022-10-03 MED ORDER — METOPROLOL SUCCINATE ER 25 MG PO TB24: 12.5000 mg | ORAL_TABLET | Freq: Every day | ORAL | 3 refills | Status: DC

## 2022-10-03 NOTE — Telephone Encounter (Signed)
Pt c/o medication issue:  1. Name of Medication: metoprolol succinate (TOPROL XL) 25 MG 24 hr tablet   2. How are you currently taking this medication (dosage and times per day)?   3. Are you having a reaction (difficulty breathing--STAT)?   4. What is your medication issue? Pt states that she was told that this med is not the right dose that Dr. Harrington Challenger originally prescribed and she is requesting call back to get clarification.

## 2022-10-03 NOTE — Progress Notes (Signed)
[facility] Note  10/09/2022     CHIEF COMPLAINT Patient presents for Retina Follow Up  HISTORY OF PRESENT ILLNESS: Nancy Webster is a 88 y.o. female who presents to the clinic today for:   HPI     Retina Follow Up   Patient presents with  CRVO/BRVO.  In left eye.  This started months ago.  Duration of 5 weeks.  Since onset it is stable.  I, the attending physician,  performed the HPI with the patient and updated documentation appropriately.        Comments   Patient states that the vision is the same. She is using Prolensa OS BID. Her blood sugar was 163 and she is unsure of her A1C. There has been no changes to her general health.       Last edited by Bernarda Caffey, MD on 10/09/2022  1:13 PM.     Pt states vision might be a little better  Referring physician: No referring provider defined for this encounter.  HISTORICAL INFORMATION:  Selected notes from the MEDICAL RECORD NUMBER Referred by Dr. Ellie Lunch for eval of ERM OU   CURRENT MEDICATIONS: Current Outpatient Medications (Ophthalmic Drugs)  Medication Sig   Bromfenac Sodium (PROLENSA) 0.07 % SOLN Place 1 drop into the left eye in the morning and at bedtime.   Polyethyl Glycol-Propyl Glycol (SYSTANE) 0.4-0.3 % SOLN Place 1 drop into both eyes 2 (two) times daily.   prednisoLONE acetate (PRED FORTE) 1 % ophthalmic suspension PLACE 1 DROP INTO LEFT EYE FOUR TIMES A DAY  *WAIT 3-5 MINUTES BETWEEN 2 EYE MEDS* *SHAKE WELL* *STORE UPRIGHT*   PROLENSA 0.07 % SOLN PLACE 1 DROP INTO THE LEFT EYE 4 (FOUR) TIMES DAILY.   PROLENSA 0.07 % SOLN PLACE 1 DROP INTO LEFT EYE FOUR TIMES DAILY   No current facility-administered medications for this visit. (Ophthalmic Drugs)   Current Outpatient Medications (Other)  Medication Sig   acetaminophen (TYLENOL) 500 MG tablet Take 500 mg by mouth every 8 (eight) hours as needed for mild pain or headache.   Biotin 5000 MCG TABS Take 1 tablet by mouth daily.    Blood Glucose Monitoring Suppl (FREESTYLE LITE) DEVI    Cholecalciferol (VITAMIN D3) 1.25 MG (50000 UT) TABS Take 1 tablet by mouth daily.   cloNIDine (CATAPRES) 0.1 MG tablet Take one tablet (0.1 mg) by mouth as needed for BP on top greater than 165 up to one time a day.   diclofenac Sodium (VOLTAREN) 1 % GEL Apply topically.   fluticasone (FLONASE) 50 MCG/ACT nasal spray Place 1 spray into both nostrils daily.   fluticasone (FLONASE) 50 MCG/ACT nasal spray Place 1 spray into both nostrils in the morning and at bedtime.   furosemide (LASIX) 20 MG tablet TAKE 1/2 TABLET = 10 MG BY MOUTH ONCE DAILY   glucose blood (FREESTYLE LITE) test strip    MEGARED OMEGA-3 KRILL OIL PO Take 1 tablet by mouth daily.   metFORMIN (GLUCOPHAGE) 500 MG tablet Take 500 mg by mouth 2 (two) times daily.   metoprolol succinate (TOPROL-XL) 25 MG 24 hr tablet Take 1 tablet (25 mg total) by mouth daily.   Multiple Vitamins-Minerals (CENTRUM SILVER ADULT 50+) TABS Take 1 tablet by mouth daily.   pantoprazole (PROTONIX) 40 MG tablet Take 40 mg by mouth daily.   REPATHA SURECLICK 017 MG/ML SOAJ Inject '140mg'$  into the skin, in the abdomen, thigh or outer area of upper arm every 14 days  sodium chloride (OCEAN) 0.65 % SOLN nasal spray Place 1 spray into both nostrils as needed for congestion.   No current facility-administered medications for this visit. (Other)   REVIEW OF SYSTEMS: ROS   Positive for: Endocrine, Cardiovascular, Eyes Negative for: Constitutional, Gastrointestinal, Neurological, Skin, Genitourinary, Musculoskeletal, HENT, Respiratory, Psychiatric, Allergic/Imm, Heme/Lymph Last edited by Annie Paras, COT on 10/09/2022  9:32 AM.     ALLERGIES Allergies  Allergen Reactions   Other Other (See Comments)    Cramps to all Cholesterol medications   Statins Other (See Comments)    "cramps" UNKNOWN   Atorvastatin Other (See Comments)    Muscle cramps   PAST MEDICAL HISTORY Past Medical History:   Diagnosis Date   Anxiety    Coronary artery disease    Diabetes mellitus without complication ([place])    GERD (gastroesophageal reflux disease)    Hypertension    Renal disorder    stage 3   Past Surgical History:  Procedure Laterality Date   CATARACT EXTRACTION Bilateral    [place]   CHOLECYSTECTOMY N/A 03/02/2018   Procedure: LAPAROSCOPIC CHOLECYSTECTOMY;  Surgeon: Greer Pickerel, MD;  Location: [facility];  Service: General;  Laterality: N/A;   ERCP N/A 06/26/2019   Procedure: ENDOSCOPIC RETROGRADE CHOLANGIOPANCREATOGRAPHY (ERCP);  Surgeon: [name], MD;  Location: [facility];  Service: Gastroenterology;  Laterality: N/A;   SPHINCTEROTOMY  06/26/2019   Procedure: SPHINCTEROTOMY;  Surgeon: [name], MD;  Location: [facility] ENDOSCOPY;  Service: Gastroenterology;;   FAMILY HISTORY Family History  Problem Relation Age of Onset   Heart failure Mother    Diabetes Daughter    SOCIAL HISTORY Social History   Tobacco Use   Smoking status: Never   Smokeless tobacco: Never  Vaping Use   Vaping Use: Never used  Substance Use Topics   Alcohol use: Not Currently   Drug use: Never       OPHTHALMIC EXAM: Base Eye Exam     Visual Acuity (Snellen - Linear)       Right Left   Dist [place] 20/30 -2 20/150   Dist ph [place] NI NI         Tonometry (Tonopen, 9:36 AM)       Right Left   Pressure 15 17         Pupils       Dark Light Shape React APD   Right 2 1 Round Slow None   Left 2 1 Round Slow None         Visual Fields       Left Right    Full Full         Extraocular Movement       Right Left    Full, Ortho Full, Ortho         Neuro/Psych     Oriented x3: Yes   Mood/Affect: Normal         Dilation     Both eyes: 1.0% Mydriacyl, 2.5% Phenylephrine @ 9:32 AM           Slit Lamp and Fundus Exam     Slit Lamp Exam       Right Left   Lids/Lashes Dermatochalasis - upper lid, mild MGD, erythemia UL and LL Dermatochalasis - upper lid, mild MGD,  erythema UL and LL   Conjunctiva/Sclera White and quiet 360 subconj heme, STK ST quad   Cornea well healed cataract wound, mild arcus Trace PEE, well healed cataract wound, mild endo pigment superiorly   Anterior  Chamber Deep and quiet deep and clear, no cell or flare   Iris Round and moderately dilated Round and moderately dilated, focal atrophy and TID at 0300   Lens PC IOL in good position with open PC PC IOL in good position with open PC   Anterior Vitreous Vitreous syneresis, Posterior vitreous detachment, vitreous condensations Vitreous syneresis, Posterior vitreous detachment         Fundus Exam       Right Left   Disc mild Pallor, Sharp rim, PPA mild Pallor, mild temporal PPA, mild cupping, disc heme superiorly -- improved, sharp rim, tilted   C/D Ratio 0.5 0.65   Macula Flat, Blunted foveal reflex, mild ERM, RPE mottling, No heme or edema Flat, Blunted foveal reflex, ERM with +cystic changes -- persistent, no heme   Vessels attenuated, Tortuous attenuated, Tortuous   Periphery Attached, blonde fundus Attached, blonde fundus, persistent punctate MA/IRH/SRH superior midzone along superior venule -- improving -- BRVO, no edema           Refraction     Wearing Rx       Sphere Cylinder Axis   Right +0.75 Sphere    Left -1.00 +1.00 022           IMAGING AND PROCEDURES  Imaging and Procedures for 10/09/2022  OCT, Retina - OU - Both Eyes       Right Eye Quality was good. Central Foveal Thickness: 389. Progression has been stable. Findings include no IRF, no SRF, abnormal foveal contour, retinal drusen , epiretinal membrane, macular pucker, vitreomacular adhesion (ERM with Blunted foveal contour and central thickening, partial PVD).   Left Eye Quality was good. Central Foveal Thickness: 436. Progression has been stable. Findings include no SRF, abnormal foveal contour, retinal drusen , epiretinal membrane, intraretinal fluid, vitreomacular adhesion (Persistent IRF  inferior fovea and macula, persistent ERM ).   Notes *Images captured and stored on drive  Diagnosis / Impression:  ERM OU  OD: ERM with Blunted foveal contour and central thickening, partial PVD OS: stable ERM w/ Persistent IRF inferior fovea and macula  Clinical management:  See below  Abbreviations: NFP - Normal foveal profile. CME - cystoid macular edema. PED - pigment epithelial detachment. IRF - intraretinal fluid. SRF - subretinal fluid. EZ - ellipsoid zone. ERM - epiretinal membrane. ORA - outer retinal atrophy. ORT - outer retinal tubulation. SRHM - subretinal hyper-reflective material. IRHM - intraretinal hyper-reflective material      Intravitreal Injection, Pharmacologic Agent - OS - Left Eye       Time Out 10/09/2022. 11:08 AM. Confirmed correct patient, procedure, site, and patient consented.   Anesthesia Topical anesthesia was used. Anesthetic medications included Lidocaine 2%, Proparacaine 0.5%.   Procedure Preparation included 5% betadine to ocular surface, eyelid speculum. A (32g) needle was used.   Injection: 1.25 mg Bevacizumab 1.'25mg'$ /0.39m   Route: Intravitreal, Site: Left Eye   NDC: [id number] Lot:: [id number] Expiration date: 10/15/2022   Post-op Post injection exam found visual acuity of at least counting fingers. The patient tolerated the procedure well. There were no complications. The patient received written and verbal post procedure care education. Post injection medications were not given.            ASSESSMENT/PLAN:    ICD-10-CM   1. Epiretinal membrane (ERM) of both eyes  H35.373 OCT, Retina - OU - Both Eyes    2. Cystoid macular edema of left eye  H35.352     3. Essential hypertension  I10  4. Hypertensive retinopathy of both eyes  H35.033     5. Branch retinal vein occlusion of left eye with macular edema  H34.8320 Intravitreal Injection, Pharmacologic Agent - OS - Left Eye    Bevacizumab (AVASTIN) SOLN 1.25 mg    6.  Pseudophakia, both eyes  Z96.1      1,2. Epiretinal membrane, both eyes  - +ERM w/ blunting of foveal contour OU -- OS with +IRF / persistent cystic changes - FA (11.01.22) shows mild perifoveal staining / leakage OS suggestive of CME component - started on PF and Prolensa QID OS on 11.1.22 - s/p STK OS #1 (12.20.22) - BCVA OD: 20/30; OS: 20/150 decreased from 20/100 - OCT shows OD: ERM with Blunted foveal contour and central thickening; OS: Persistent IRF inferior fovea and macula, persistent ERM - FA (06.06.23) shows OD: Mild, late peripapillary staining; OS: Delayed venous return of superior venule, punctate perivascular blockage along superior venule -- superior BRVO outside of macula  - cont PF and Prolensa BID OS --decrease both drops to qdaily x7 days, then stop - STK consent signed 12.20.22  - pt wishes to hold off on surgery  3,4. Hypertensive retinopathy OU  - BP in office 6.6.23 was 171/73 - discussed importance of tight BP control - continue to monitor  5. BRVO OS  - s/p IVA OS #1 (08.10.23), #2 (09.07.23), #3 (10.06.23), #4 (11.06.23) - persistent retinal hemorrhages along superior venule, superior to disc -- improving - FA 6.6.23 shows delayed venous return through superior venule suggestive of BRVO - pt also reports history of recent TIA-like episode (telephone note on 4.12.23) -- pt experienced transient visual field loss and blurred vision that resolved over several minutes  - BP at last visit (05/05/22) -- 171/73 - all of these factors raise concern / increased risk of possible cardiovascular compromise, I.e. CVA, MI, etc. - discussed findings and case with pt's PCP -- Dr. 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  - pt wishes to proceed with injection - RBA of procedure discussed, questions answered - IVA informed consent obtained and signed, 08.10.23 (OS) - see procedure note - f/u in 6 wks, sooner prn -- DFE/OCT  6. Pseudophakia OU  - s/p CE/IOL (Dr. Vilinda Blanks)  - IOLs in good position, doing well  - continue to monitor  Ophthalmic Meds Ordered this visit:  Meds ordered this encounter  Medications   Bevacizumab (AVASTIN) SOLN 1.25 mg     Return in about 6 weeks (around 11/20/2022) for f/u BRVO OS , DFE, OCT, Possible, IVA, OS.  There are no Patient Instructions on file for this visit.  Explained the diagnoses, plan, and follow up with the patient and they expressed understanding.  Patient expressed understanding of the importance of proper follow up care.   This document serves as a record of services personally performed by Gardiner Sleeper, MD, PhD. It was created on their behalf by Roselee Nova, COMT. The creation of this record is the provider's dictation and/or activities during the visit.  Electronically signed by: Roselee Nova, COMT 10/09/22 1:13 PM  This document serves as a record of services personally performed by Gardiner Sleeper, MD, PhD.  It was created on their behalf by San Jetty. Owens Shark, OA an ophthalmic technician. The creation of this record is the provider's dictation and/or activities during the visit.    Electronically signed by: San Jetty. Owens Shark, [place] 12.11.2023 1:13 PM  Gardiner Sleeper, M.D., Ph.D. Diseases & Surgery of the Retina and Vitreous [facility]  I have reviewed the above documentation for accuracy and completeness, and I agree with the above. Gardiner Sleeper, M.D., Ph.D. 10/09/22 1:14 PM   Abbreviations: M myopia (nearsighted); A astigmatism; H hyperopia (farsighted); P presbyopia; Mrx spectacle prescription;  CTL contact lenses; OD right eye; OS left eye; OU both eyes  XT exotropia;  ET esotropia; PEK punctate epithelial keratitis; PEE punctate epithelial erosions; DES dry eye syndrome; MGD meibomian gland dysfunction; ATs artificial tears; PFAT's preservative free artificial tears; [place] nuclear sclerotic cataract; PSC posterior subcapsular cataract; ERM epi-retinal membrane; PVD posterior vitreous detachment; RD retinal detachment; DM diabetes mellitus; DR diabetic retinopathy; NPDR non-proliferative diabetic retinopathy; PDR proliferative diabetic retinopathy; CSME clinically significant macular edema; DME diabetic macular edema; dbh dot blot hemorrhages; CWS cotton wool spot; POAG primary open angle glaucoma; C/D cup-to-disc ratio; HVF humphrey visual field; GVF goldmann visual field; OCT optical coherence tomography; IOP intraocular pressure; BRVO Branch retinal vein occlusion; CRVO central retinal vein occlusion; CRAO central retinal artery occlusion; BRAO branch retinal artery occlusion; RT retinal tear; SB scleral buckle; PPV pars plana vitrectomy; VH Vitreous hemorrhage; PRP panretinal laser photocoagulation; IVK intravitreal kenalog; VMT vitreomacular traction; MH Macular hole;  NVD neovascularization of the disc; NVE neovascularization elsewhere; AREDS age related eye disease study; ARMD age related macular degeneration; POAG primary open angle glaucoma; EBMD epithelial/anterior basement membrane dystrophy; ACIOL anterior chamber intraocular lens; IOL intraocular lens; PCIOL posterior chamber intraocular lens; Phaco/IOL phacoemulsification with intraocular lens placement; [place] photorefractive keratectomy; LASIK laser assisted in situ keratomileusis; HTN hypertension; DM diabetes mellitus; COPD chronic obstructive pulmonary disease

## 2022-10-03 NOTE — Telephone Encounter (Signed)
Patient called in to clarify her metoprolol dosage. She states she has been taking metoprolol '25mg'$  daily but was told she was supposed to be taking 12.'5mg'$  or 1/2 tablet daily.  She reports her BP and HR for last 2 days as follows: 123/65 -65 126/55 -85  141/57 -79  Forwarded to Dr Harrington Challenger for review and clarification. Advised patient we would let her know if she needs to decrease dose or stay the same after Dr Harrington Challenger advises [place].

## 2022-10-04 NOTE — Telephone Encounter (Signed)
Left message for patient advising to stay on current medication dose. Advised to call back with any questions.

## 2022-10-04 NOTE — Telephone Encounter (Signed)
Keep on same doses sue is currently taking

## 2022-10-05 ENCOUNTER — Other Ambulatory Visit: Payer: Self-pay | Admitting: Internal Medicine

## 2022-10-05 NOTE — Telephone Encounter (Signed)
Rx refill sent to pharmacy. 

## 2022-10-09 ENCOUNTER — Ambulatory Visit (INDEPENDENT_AMBULATORY_CARE_PROVIDER_SITE_OTHER): Payer: Medicare Other | Admitting: Ophthalmology

## 2022-10-09 ENCOUNTER — Encounter (INDEPENDENT_AMBULATORY_CARE_PROVIDER_SITE_OTHER): Payer: Self-pay | Admitting: Ophthalmology

## 2022-10-09 ENCOUNTER — Other Ambulatory Visit: Payer: Self-pay | Admitting: Internal Medicine

## 2022-10-09 DIAGNOSIS — H35373 Puckering of macula, bilateral: Secondary | ICD-10-CM | POA: Diagnosis not present

## 2022-10-09 DIAGNOSIS — H35352 Cystoid macular degeneration, left eye: Secondary | ICD-10-CM | POA: Diagnosis not present

## 2022-10-09 DIAGNOSIS — H34832 Tributary (branch) retinal vein occlusion, left eye, with macular edema: Secondary | ICD-10-CM | POA: Diagnosis not present

## 2022-10-09 DIAGNOSIS — I10 Essential (primary) hypertension: Secondary | ICD-10-CM | POA: Diagnosis not present

## 2022-10-09 DIAGNOSIS — Z961 Presence of intraocular lens: Secondary | ICD-10-CM | POA: Diagnosis not present

## 2022-10-09 DIAGNOSIS — H35033 Hypertensive retinopathy, bilateral: Secondary | ICD-10-CM

## 2022-10-09 MED ORDER — SHINGRIX 50 MCG/0.5ML IM SUSR: 0.5000 mL | Freq: Once | INTRAMUSCULAR | 0 refills | Status: AC

## 2022-10-09 MED ORDER — BEVACIZUMAB CHEMO INJECTION 1.25MG/0.05ML SYRINGE FOR KALEIDOSCOPE
1.2500 mg | INTRAVITREAL | Status: AC | PRN
  Administered 2022-10-09: 1.25 mg via INTRAVITREAL

## 2022-11-13 ENCOUNTER — Other Ambulatory Visit: Payer: Self-pay | Admitting: Internal Medicine

## 2022-11-15 NOTE — Progress Notes (Signed)
[facility] Note  11/20/2022     CHIEF COMPLAINT Patient presents for Retina Follow Up  HISTORY OF PRESENT ILLNESS: Nancy Webster is a 89 y.o. female who presents to the clinic today for:   HPI     Retina Follow Up   Patient presents with  CRVO/BRVO.  In left eye.  This started 6 weeks ago.  Duration of 6 weeks.  Since onset it is stable.  I, the attending physician,  performed the HPI with the patient and updated documentation appropriately.        Comments   6 month retina follow up for BRVO OS and IVA pt states no vision changes noticed she is using systane for when her eyes get dry and is helping some        Last edited by Bernarda Caffey, MD on 11/21/2022 11:11 PM.    Pt states vision seems the same  Referring physician: Garwin Brothers, MD [address] [place],  [place] [address]  HISTORICAL INFORMATION:  Selected notes from the MEDICAL RECORD NUMBER Referred by Dr. Ellie Lunch for eval of ERM OU   CURRENT MEDICATIONS: Current Outpatient Medications (Ophthalmic Drugs)  Medication Sig   Bromfenac Sodium (PROLENSA) 0.07 % SOLN Place 1 drop into the left eye in the morning and at bedtime.   Polyethyl Glycol-Propyl Glycol (SYSTANE) 0.4-0.3 % SOLN Place 1 drop into both eyes 2 (two) times daily.   prednisoLONE acetate (PRED FORTE) 1 % ophthalmic suspension PLACE 1 DROP INTO LEFT EYE FOUR TIMES A DAY  *WAIT 3-5 MINUTES BETWEEN 2 EYE MEDS* *SHAKE WELL* *STORE UPRIGHT*   PROLENSA 0.07 % SOLN PLACE 1 DROP INTO THE LEFT EYE 4 (FOUR) TIMES DAILY.   PROLENSA 0.07 % SOLN PLACE 1 DROP INTO LEFT EYE FOUR TIMES DAILY   No current facility-administered medications for this visit. (Ophthalmic Drugs)   Current Outpatient Medications (Other)  Medication Sig   acetaminophen (TYLENOL) 500 MG tablet Take 500 mg by mouth every 8 (eight) hours as needed for mild pain or headache.   Biotin 5000 MCG TABS Take 1 tablet by mouth daily.   Blood Glucose Monitoring Suppl  (FREESTYLE LITE) DEVI    Cholecalciferol (VITAMIN D3) 1.25 MG (50000 UT) TABS Take 1 tablet by mouth daily.   cloNIDine (CATAPRES) 0.1 MG tablet Take one tablet (0.1 mg) by mouth as needed for BP on top greater than 165 up to one time a day.   diclofenac Sodium (VOLTAREN) 1 % GEL Apply topically.   fluticasone (FLONASE) 50 MCG/ACT nasal spray Place 1 spray into both nostrils daily.   fluticasone (FLONASE) 50 MCG/ACT nasal spray Place 1 spray into both nostrils in the morning and at bedtime.   furosemide (LASIX) 20 MG tablet TAKE 1/2 TABLET = 10 MG BY MOUTH ONCE DAILY   glucose blood (FREESTYLE LITE) test strip    MEGARED OMEGA-3 KRILL OIL PO Take 1 tablet by mouth daily.   metFORMIN (GLUCOPHAGE) 500 MG tablet Take 500 mg by mouth 2 (two) times daily.   metoprolol succinate (TOPROL-XL) 25 MG 24 hr tablet Take 1 tablet (25 mg total) by mouth daily.   Multiple Vitamins-Minerals (CENTRUM SILVER ADULT 50+) TABS Take 1 tablet by mouth daily.   pantoprazole (PROTONIX) 40 MG tablet Take 40 mg by mouth daily.   REPATHA SURECLICK 607 MG/ML SOAJ Inject '140mg'$  into the skin, in the abdomen, thigh or outer area of upper arm every 14 days  sodium chloride (OCEAN) 0.65 % SOLN nasal spray Place 1 spray into both nostrils as needed for congestion.   No current facility-administered medications for this visit. (Other)   REVIEW OF SYSTEMS: ROS   Positive for: Endocrine, Cardiovascular, Eyes Negative for: Constitutional, Gastrointestinal, Neurological, Skin, Genitourinary, Musculoskeletal, HENT, Respiratory, Psychiatric, Allergic/Imm, Heme/Lymph Last edited by Parthenia Ames, COT on 11/20/2022  9:56 AM.     ALLERGIES Allergies  Allergen Reactions   Other Other (See Comments)    Cramps to all Cholesterol medications   Statins Other (See Comments)    "cramps" UNKNOWN   Atorvastatin Other (See Comments)    Muscle cramps   PAST MEDICAL HISTORY Past Medical History:  Diagnosis Date   Anxiety     Coronary artery disease    Diabetes mellitus without complication ([place])    GERD (gastroesophageal reflux disease)    Hypertension    Renal disorder    stage 3   Past Surgical History:  Procedure Laterality Date   CATARACT EXTRACTION Bilateral    [place]   CHOLECYSTECTOMY N/A 03/02/2018   Procedure: LAPAROSCOPIC CHOLECYSTECTOMY;  Surgeon: Greer Pickerel, MD;  Location: [facility];  Service: General;  Laterality: N/A;   ERCP N/A 06/26/2019   Procedure: ENDOSCOPIC RETROGRADE CHOLANGIOPANCREATOGRAPHY (ERCP);  Surgeon: [name], MD;  Location: [facility];  Service: Gastroenterology;  Laterality: N/A;   SPHINCTEROTOMY  06/26/2019   Procedure: SPHINCTEROTOMY;  Surgeon: [name], MD;  Location: [facility] ENDOSCOPY;  Service: Gastroenterology;;   FAMILY HISTORY Family History  Problem Relation Age of Onset   Heart failure Mother    Diabetes Daughter    SOCIAL HISTORY Social History   Tobacco Use   Smoking status: Never   Smokeless tobacco: Never  Vaping Use   Vaping Use: Never used  Substance Use Topics   Alcohol use: Not Currently   Drug use: Never       OPHTHALMIC EXAM: Base Eye Exam     Visual Acuity (Snellen - Linear)       Right Left   Dist [place] 20/30 -2 20/150 -2   Dist ph [place] NI          Tonometry (Tonopen, 10:06 AM)       Right Left   Pressure 16 16         Pupils       Pupils Dark Light Shape React APD   Right PERRL 2 1 Round Slow None   Left PERRL 2 1 Round Slow None         Visual Fields       Left Right    Full Full         Extraocular Movement       Right Left    Full, Ortho Full, Ortho         Neuro/Psych     Oriented x3: Yes   Mood/Affect: Normal         Dilation     Both eyes:            Slit Lamp and Fundus Exam     Slit Lamp Exam       Right Left   Lids/Lashes Dermatochalasis - upper lid, mild MGD, erythemia UL and LL Dermatochalasis - upper lid, mild MGD, erythema UL and LL   Conjunctiva/Sclera White and quiet  360 subconj heme, STK ST quad   Cornea well healed cataract wound, mild arcus Trace PEE, well healed cataract wound, mild endo pigment superiorly   Anterior Chamber Deep  and quiet deep and clear, no cell or flare   Iris Round and moderately dilated Round and moderately dilated, focal atrophy and TID at 0300   Lens PC IOL in good position with open PC PC IOL in good position with open PC   Anterior Vitreous Vitreous syneresis, Posterior vitreous detachment, vitreous condensations Vitreous syneresis, Posterior vitreous detachment         Fundus Exam       Right Left   Disc mild Pallor, Sharp rim, PPA mild Pallor, mild temporal PPA, mild cupping, disc heme superiorly -- improved, sharp rim, tilted   C/D Ratio 0.5 0.65   Macula Flat, Blunted foveal reflex, mild ERM, RPE mottling, No heme or edema Flat, Blunted foveal reflex, ERM with +cystic changes -- persistent -- new central SRF; worse edema overall, no heme   Vessels attenuated, Tortuous attenuated, Tortuous   Periphery Attached, blonde fundus Attached, blonde fundus, persistent punctate MA/IRH/SRH superior midzone along superior venule -- slightly improved -- BRVO, no edema           Refraction     Wearing Rx       Sphere Cylinder Axis   Right +0.75 Sphere    Left -1.00 +1.00 022           IMAGING AND PROCEDURES  Imaging and Procedures for 11/20/2022  OCT, Retina - OU - Both Eyes       Right Eye Quality was good. Central Foveal Thickness: 391. Progression has been stable. Findings include no IRF, no SRF, abnormal foveal contour, retinal drusen , epiretinal membrane, macular pucker, vitreomacular adhesion (ERM with Blunted foveal contour and central thickening, partial PVD).   Left Eye Quality was good. Central Foveal Thickness: 489. Progression has worsened. Findings include abnormal foveal contour, retinal drusen , epiretinal membrane, intraretinal fluid, subretinal fluid, vitreomacular adhesion (Interval development  of central SRF, persistent IRF centrally, persistent ERM ).   Notes *Images captured and stored on drive  Diagnosis / Impression:  ERM OU  OD: ERM with Blunted foveal contour and central thickening, partial PVD OS: Interval development of central SRF, persistent IRF centrally, persistent ERM   Clinical management:  See below  Abbreviations: NFP - Normal foveal profile. CME - cystoid macular edema. PED - pigment epithelial detachment. IRF - intraretinal fluid. SRF - subretinal fluid. EZ - ellipsoid zone. ERM - epiretinal membrane. ORA - outer retinal atrophy. ORT - outer retinal tubulation. SRHM - subretinal hyper-reflective material. IRHM - intraretinal hyper-reflective material      Intravitreal Injection, Pharmacologic Agent - OS - Left Eye       Time Out 11/20/2022. 11:23 AM. Confirmed correct patient, procedure, site, and patient consented.   Anesthesia Topical anesthesia was used. Anesthetic medications included Lidocaine 2%, Proparacaine 0.5%.   Procedure Preparation included 5% betadine to ocular surface, eyelid speculum. A (32g) needle was used.   Injection: 2 mg aflibercept 2 MG/0.05ML   Route: Intravitreal, Site: Left Eye   NDC: A3590391, Lot: [id number], Expiration date: 02/27/2023, Waste: 0 mL   Post-op Post injection exam found visual acuity of at least counting fingers. The patient tolerated the procedure well. There were no complications. The patient received written and verbal post procedure care education. Post injection medications were not given.            ASSESSMENT/PLAN:    ICD-10-CM   1. Epiretinal membrane (ERM) of both eyes  H35.373 OCT, Retina - OU - Both Eyes    2. Cystoid macular edema of left  eye  H35.352 OCT, Retina - OU - Both Eyes    Intravitreal Injection, Pharmacologic Agent - OS - Left Eye    aflibercept (EYLEA) SOLN 2 mg    3. Essential hypertension  I10     4. Hypertensive retinopathy of both eyes  H35.033     5. 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 Pseudophakia, both eyes  Z96.1      1,2. Epiretinal membrane, both eyes  - +ERM w/ blunting of foveal contour OU -- OS with +IRF / persistent cystic changes - FA (11.01.22) shows mild perifoveal staining / leakage OS suggestive of CME component - repeat FA (06.06.23) shows OD: Mild, late peripapillary staining; OS: Delayed venous return of superior venule, punctate perivascular blockage along superior venule -- superior BRVO outside of macula  - started on PF and Prolensa QID OS on 11.1.22 - s/p STK OS #1 (12.20.22) - BCVA OD: 20/30; OS: 20/150 decreased from 20/100 - OCT shows OD: ERM with Blunted foveal contour and central thickening, partial PVD; OS: Interval development of central SRF, persistent IRF centrally, persistent ERM at 6 wks - d/c PF and Prolensa BID OS  - STK consent signed 12.20.22  - pt wishes to hold off on surgery  3,4. Hypertensive retinopathy OU  - BP in office 6.6.23 was 171/73 - discussed importance of tight BP control - continue to monitor  5. BRVO OS  - s/p IVA OS #1 (08.10.23), #2 (09.07.23), #3 (10.06.23), #4 (11.06.23), #5 (12.11.23) - persistent retinal hemorrhages along superior venule, superior to disc -- improving - FA 6.6.23 shows delayed venous return through superior venule suggestive of BRVO - pt also reports history of recent TIA-like episode (telephone note on 4.12.23) -- pt experienced transient visual field loss and blurred vision that resolved over several minutes  - BP at last visit (05/05/22) -- 171/73 - all of these factors raise concern / increased risk of possible cardiovascular compromise, I.e. CVA, MI, etc. - discussed findings and case with pt's PCP -- Dr. 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 Pseudophakia OU  - s/p CE/IOL (Dr. Vilinda Blanks)  - IOLs in good position, doing well  - continue to monitor  Ophthalmic Meds Ordered this visit:  Meds ordered this encounter  Medications   aflibercept (EYLEA) SOLN 2 mg     Return in about 5 weeks (around 12/25/2022) for f/u BRVO OS, DFE, OCT.  There are no Patient Instructions on file for this visit.  Explained the diagnoses, plan, and follow up with the patient and they expressed understanding.  Patient expressed understanding of the importance of  proper follow up care.   This document serves as a record of services personally performed by Gardiner Sleeper, MD, PhD. It was created on their behalf by Roselee Nova, COMT. The creation of this record is the provider's dictation and/or activities during the visit.  Electronically signed by: Roselee Nova, COMT 11/21/22 11:12 PM  This document serves as a record of services personally performed by Gardiner Sleeper, MD, PhD. It was created on their behalf by San Jetty. Owens Shark, OA an ophthalmic technician. The creation of this record is the provider's dictation and/or activities during the visit.    Electronically signed by: San Jetty. Owens Shark, [place] 01.22.2024 11:12 PM  Gardiner Sleeper, M.D., Ph.D. Diseases & Surgery of the Retina and Vitreous [facility]  I  have reviewed the above documentation for accuracy and completeness, and I agree with the above. Gardiner Sleeper, M.D., Ph.D. 11/21/22 11:16 PM   Abbreviations: M myopia (nearsighted); A astigmatism; H hyperopia (farsighted); P presbyopia; Mrx spectacle prescription;  CTL contact lenses; OD right eye; OS left eye; OU both eyes  XT exotropia; ET esotropia; PEK punctate epithelial keratitis; PEE punctate epithelial erosions; DES dry eye syndrome; MGD meibomian gland dysfunction; ATs artificial tears; PFAT's preservative free artificial tears; [place] nuclear sclerotic cataract; PSC posterior subcapsular cataract; ERM epi-retinal membrane; PVD posterior vitreous detachment; RD retinal detachment; DM diabetes mellitus; DR diabetic retinopathy; NPDR non-proliferative diabetic retinopathy; PDR proliferative diabetic retinopathy; CSME clinically significant macular edema; DME diabetic macular edema; dbh dot blot hemorrhages; CWS cotton wool spot; POAG primary open angle glaucoma; C/D cup-to-disc ratio; HVF humphrey visual field; GVF goldmann visual field; OCT optical coherence tomography; IOP intraocular pressure; BRVO Branch retinal vein occlusion; CRVO central retinal vein occlusion; CRAO central retinal artery occlusion; BRAO branch retinal artery occlusion; RT retinal tear; SB scleral buckle; PPV pars plana vitrectomy; VH Vitreous hemorrhage; PRP panretinal laser photocoagulation; IVK intravitreal kenalog; VMT vitreomacular traction; MH Macular hole;  NVD neovascularization of the disc; NVE neovascularization elsewhere; AREDS age related eye disease study; ARMD age related macular degeneration; POAG primary open angle glaucoma; EBMD epithelial/anterior basement membrane dystrophy; ACIOL anterior chamber intraocular lens; IOL intraocular lens; PCIOL posterior chamber intraocular lens; Phaco/IOL phacoemulsification with intraocular lens placement; [place] photorefractive keratectomy; LASIK laser assisted in situ  keratomileusis; HTN hypertension; DM diabetes mellitus; COPD chronic obstructive pulmonary disease

## 2022-11-20 ENCOUNTER — Encounter (INDEPENDENT_AMBULATORY_CARE_PROVIDER_SITE_OTHER): Payer: Self-pay | Admitting: Ophthalmology

## 2022-11-20 ENCOUNTER — Ambulatory Visit (INDEPENDENT_AMBULATORY_CARE_PROVIDER_SITE_OTHER): Payer: Medicare Other | Admitting: Ophthalmology

## 2022-11-20 DIAGNOSIS — H35352 Cystoid macular degeneration, left eye: Secondary | ICD-10-CM

## 2022-11-20 DIAGNOSIS — H35373 Puckering of macula, bilateral: Secondary | ICD-10-CM | POA: Diagnosis not present

## 2022-11-20 DIAGNOSIS — Z961 Presence of intraocular lens: Secondary | ICD-10-CM | POA: Diagnosis not present

## 2022-11-20 DIAGNOSIS — H34832 Tributary (branch) retinal vein occlusion, left eye, with macular edema: Secondary | ICD-10-CM

## 2022-11-20 DIAGNOSIS — H35033 Hypertensive retinopathy, bilateral: Secondary | ICD-10-CM | POA: Diagnosis not present

## 2022-11-20 DIAGNOSIS — I10 Essential (primary) hypertension: Secondary | ICD-10-CM | POA: Diagnosis not present

## 2022-11-20 MED ORDER — AFLIBERCEPT 2MG/0.05ML IZ SOLN FOR KALEIDOSCOPE
2.0000 mg | INTRAVITREAL | Status: AC | PRN
  Administered 2022-11-20: 2 mg via INTRAVITREAL

## 2022-12-22 NOTE — Progress Notes (Shared)
[facility] Note  12/25/2022     CHIEF COMPLAINT Patient presents for Retina Follow Up  HISTORY OF PRESENT ILLNESS: Nancy Webster is a 89 y.o. female who presents to the clinic today for:   HPI     Retina Follow Up   Patient presents with  CRVO/BRVO.  In left eye.  This started 5 weeks ago.  Duration of 5 weeks.  Since onset it is stable.  I, the attending physician,  performed the HPI with the patient and updated documentation appropriately.        Comments   5 week retina follow up BRVO OS and I'VE OS pt states no vision changes noticed she denies any flashes or floaters       Last edited by Bernarda Caffey, MD on 12/25/2022 11:43 AM.     Pt states was hoping vision would be better today, but she's not sure after reading the eye chart  Referring physician: Garwin Brothers, MD [address],  [place] [id number]  HISTORICAL INFORMATION:  Selected notes from the [facility] Referred by Dr. Ellie Lunch for eval of ERM OU   CURRENT MEDICATIONS: Current Outpatient Medications (Ophthalmic Drugs)  Medication Sig   Bromfenac Sodium (PROLENSA) 0.07 % SOLN Place 1 drop into the left eye in the morning and at bedtime.   Polyethyl Glycol-Propyl Glycol (SYSTANE) 0.4-0.3 % SOLN Place 1 drop into both eyes 2 (two) times daily.   prednisoLONE acetate (PRED FORTE) 1 % ophthalmic suspension PLACE 1 DROP INTO LEFT EYE FOUR TIMES A DAY  *WAIT 3-5 MINUTES BETWEEN 2 EYE MEDS* *SHAKE WELL* *STORE UPRIGHT*   PROLENSA 0.07 % SOLN PLACE 1 DROP INTO THE LEFT EYE 4 (FOUR) TIMES DAILY.   PROLENSA 0.07 % SOLN PLACE 1 DROP INTO LEFT EYE FOUR TIMES DAILY   No current facility-administered medications for this visit. (Ophthalmic Drugs)   Current Outpatient Medications (Other)  Medication Sig   acetaminophen (TYLENOL) 500 MG tablet Take 500 mg by mouth every 8 (eight) hours as needed for mild pain or headache.   Biotin 5000 MCG TABS Take 1 tablet by mouth daily.    Blood Glucose Monitoring Suppl (FREESTYLE LITE) DEVI    Cholecalciferol (VITAMIN D3) 1.25 MG (50000 UT) TABS Take 1 tablet by mouth daily.   cloNIDine (CATAPRES) 0.1 MG tablet Take one tablet (0.1 mg) by mouth as needed for BP on top greater than 165 up to one time a day.   diclofenac Sodium (VOLTAREN) 1 % GEL Apply topically.   fluticasone (FLONASE) 50 MCG/ACT nasal spray Place 1 spray into both nostrils daily.   fluticasone (FLONASE) 50 MCG/ACT nasal spray Place 1 spray into both nostrils in the morning and at bedtime.   furosemide (LASIX) 20 MG tablet TAKE 1/2 TABLET = 10 MG BY MOUTH ONCE DAILY   glucose blood (FREESTYLE LITE) test strip    MEGARED OMEGA-3 KRILL OIL PO Take 1 tablet by mouth daily.   metFORMIN (GLUCOPHAGE) 500 MG tablet Take 500 mg by mouth 2 (two) times daily.   metoprolol succinate (TOPROL-XL) 25 MG 24 hr tablet Take 1 tablet (25 mg total) by mouth daily.   Multiple Vitamins-Minerals (CENTRUM SILVER ADULT 50+) TABS Take 1 tablet by mouth daily.   pantoprazole (PROTONIX) 40 MG tablet Take 40 mg by mouth daily.   REPATHA SURECLICK [id number] MG/ML SOAJ Inject '140mg'$  into the skin, in the abdomen, thigh or outer area of upper arm every  14 days   sodium chloride (OCEAN) 0.65 % SOLN nasal spray Place 1 spray into both nostrils as needed for congestion.   No current facility-administered medications for this visit. (Other)   REVIEW OF SYSTEMS: ROS   Positive for: Endocrine, Cardiovascular, Eyes Negative for: Constitutional, Gastrointestinal, Neurological, Skin, Genitourinary, Musculoskeletal, HENT, Respiratory, Psychiatric, Allergic/Imm, Heme/Lymph Last edited by Parthenia Ames, COT on 12/25/2022  9:13 AM.     ALLERGIES Allergies  Allergen Reactions   Other Other (See Comments)    Cramps to all Cholesterol medications   Statins Other (See Comments)    "cramps" UNKNOWN   Atorvastatin Other (See Comments)    Muscle cramps   PAST MEDICAL HISTORY Past Medical History:   Diagnosis Date   Anxiety    Coronary artery disease    Diabetes mellitus without complication ([place])    GERD (gastroesophageal reflux disease)    Hypertension    Renal disorder    stage 3   Past Surgical History:  Procedure Laterality Date   CATARACT EXTRACTION Bilateral    [place]   CHOLECYSTECTOMY N/A 03/02/2018   Procedure: LAPAROSCOPIC CHOLECYSTECTOMY;  Surgeon: Greer Pickerel, MD;  Location: [place] [facility];  Service: General;  Laterality: N/A;   ERCP N/A 06/26/2019   Procedure: ENDOSCOPIC RETROGRADE CHOLANGIOPANCREATOGRAPHY (ERCP);  Surgeon: [name], MD;  Location: [facility];  Service: Gastroenterology;  Laterality: N/A;   SPHINCTEROTOMY  06/26/2019   Procedure: SPHINCTEROTOMY;  Surgeon: [name], MD;  Location[facility] ENDOSCOPY;  Service: Gastroenterology;;   FAMILY HISTORY Family History  Problem Relation Age of Onset   Heart failure Mother    Diabetes Daughter    SOCIAL HISTORY Social History   Tobacco Use   Smoking status: Never   Smokeless tobacco: Never  Vaping Use   Vaping Use: Never used  Substance Use Topics   Alcohol use: Not Currently   Drug use: Never       OPHTHALMIC EXAM: Base Eye Exam     Visual Acuity (Snellen - Linear)       Right Left   Dist [employer] 20/40 20/300   Dist ph [place] NI NI         Tonometry (Tonopen, 9:18 AM)       Right Left   Pressure 10 15         Pupils       Pupils Dark Light Shape React APD   Right PERRL 2 1 Round Brisk None   Left PERRL 2 1 Round Brisk None         Visual Fields       Left Right    Full Full         Extraocular Movement       Right Left    Full, Ortho Full, Ortho         Neuro/Psych     Oriented x3: Yes   Mood/Affect: Normal         Dilation     Both eyes: 2.5% Phenylephrine @ 9:18 AM           Slit Lamp and Fundus Exam     Slit Lamp Exam       Right Left   Lids/Lashes Dermatochalasis - upper lid, mild MGD, erythemia UL and LL Dermatochalasis - upper lid, mild MGD,  erythema UL and LL   Conjunctiva/Sclera White and quiet White and quiet   Cornea well healed cataract wound, mild arcus, 1+ Punctate epithelial erosions, tear film debris 1+ fine PEE, well healed  cataract wound, tear film debris   Anterior Chamber Deep and quiet deep and clear, no cell or flare   Iris Round and moderately dilated Round and moderately dilated, focal atrophy and TID at 0300   Lens PC IOL in good position with open PC PC IOL in good position with open PC   Anterior Vitreous Vitreous syneresis, Posterior vitreous detachment, vitreous condensations Vitreous syneresis, Posterior vitreous detachment         Fundus Exam       Right Left   Disc mild Pallor, Sharp rim, PPA mild Pallor, mild temporal PPA, mild cupping, sharp rim, tilted   C/D Ratio 0.5 0.65   Macula Flat, Blunted foveal reflex, mild ERM, RPE mottling, No heme or edema Flat, Blunted foveal reflex, ERM with +cystic changes -- slightly improved, persistent central SRF; worse edema overall, no heme   Vessels attenuated, Tortuous attenuated, Tortuous   Periphery Attached, blonde fundus Attached, blonde fundus, persistent punctate MA/IRH/SRH superior midzone along superior venule -- slightly improved -- BRVO, no edema           Refraction     Wearing Rx       Sphere Cylinder Axis   Right +0.75 Sphere    Left -1.00 +1.00 022           IMAGING AND PROCEDURES  Imaging and Procedures for 12/25/2022  OCT, Retina - OU - Both Eyes       Right Eye Quality was good. Central Foveal Thickness: 389. Progression has been stable. Findings include no IRF, no SRF, abnormal foveal contour, retinal drusen , epiretinal membrane, macular pucker, vitreomacular adhesion (ERM with Blunted foveal contour and central thickening, partial PVD).   Left Eye Quality was good. Central Foveal Thickness: 457. Progression has improved. Findings include abnormal foveal contour, retinal drusen , epiretinal membrane, intraretinal fluid,  subretinal fluid, vitreomacular adhesion (Persistent central SRF, mild interval improvement in IRF centrally, persistent ERM, FTMH under ERM).   Notes *Images captured and stored on drive  Diagnosis / Impression:  ERM OU  OD: ERM with Blunted foveal contour and central thickening, partial PVD OS: Persistent central SRF, mild interval improvement in IRF centrally, persistent ERM, FTMH under ERM  Clinical management:  See below  Abbreviations: NFP - Normal foveal profile. CME - cystoid macular edema. PED - pigment epithelial detachment. IRF - intraretinal fluid. SRF - subretinal fluid. EZ - ellipsoid zone. ERM - epiretinal membrane. ORA - outer retinal atrophy. ORT - outer retinal tubulation. SRHM - subretinal hyper-reflective material. IRHM - intraretinal hyper-reflective material      Intravitreal Injection, Pharmacologic Agent - OS - Left Eye       Time Out 12/25/2022. 9:50 AM. Confirmed correct patient, procedure, site, and patient consented.   Anesthesia Topical anesthesia was used. Anesthetic medications included Lidocaine 2%, Proparacaine 0.5%.   Procedure Preparation included 5% betadine to ocular surface, eyelid speculum. A (32g) needle was used.   Injection: 2 mg aflibercept 2 MG/0.05ML   Route: Intravitreal, Site: Left Eye   NDC: [id number], Lot: [id number], Expiration date: 12/28/2023, Waste: 0 mL   Post-op Post injection exam found visual acuity of at least counting fingers. The patient tolerated the procedure well. There were no complications. The patient received written and verbal post procedure care education. Post injection medications were not given.            ASSESSMENT/PLAN:    ICD-10-CM   1. Epiretinal membrane (ERM) of both eyes  H35.373 OCT, Retina - OU -  Both Eyes    2. Cystoid macular edema of left eye  H35.352 Intravitreal Injection, Pharmacologic Agent - OS - Left Eye    aflibercept (EYLEA) SOLN 2 mg    3. Essential hypertension  I10      4. Hypertensive retinopathy of both eyes  H35.033     5. Branch retinal vein occlusion of left eye with macular edema  H34.8320 Intravitreal Injection, Pharmacologic Agent - OS - Left Eye    aflibercept (EYLEA) SOLN 2 mg    6. Pseudophakia, both eyes  Z96.1     7. Dry eyes  H04.123      1,2. Epiretinal membrane, both eyes  - +ERM w/ blunting of foveal contour OU -- OS with +IRF / persistent cystic changes - FA (11.01.22) shows mild perifoveal staining / leakage OS suggestive of CME component - repeat FA (06.06.23) shows OD: Mild, late peripapillary staining; OS: Delayed venous return of superior venule, punctate perivascular blockage along superior venule -- superior BRVO outside of macula  - started on PF and Prolensa QID OS on 11.1.22 - s/p STK OS #1 (12.20.22) - BCVA OD: 20/40; OS: 20/300 both decreased - OCT shows OD: ERM with Blunted foveal contour and central thickening, partial PVD; OS: Persistent central SRF, mild interval improvement in IRF centrally, persistent ERM, FTMH under ERM - d/c PF and Prolensa BID OS due to intolerance - STK consent signed 12.20.22  - pt wishes to hold off on surgery  3,4. Hypertensive retinopathy OU  - BP in office 6.6.23 was 171/73 - discussed importance of tight BP control - continue to monitor  5. BRVO OS  - s/p IVA OS #1 (08.10.23), #2 (09.07.23), #3 (10.06.23), #4 (11.06.23), #5 (12.11.23) -- IVA resistance  - s/p IVE OS #1 (01.22.24) - persistent retinal hemorrhages along superior venule, superior to disc -- improving - FA 6.6.23 shows delayed venous return through superior venule suggestive of BRVO - pt also reports history of recent TIA-like episode (telephone note on 4.12.23) -- pt experienced transient visual field loss and blurred vision that resolved over several minutes  - BP at previous visit (05/05/22) -- 171/73 - all of these factors raise concern / increased risk of possible cardiovascular compromise, I.e. CVA, MI, etc. -  discussed findings and case with pt's PCP -- Dr. 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 Pseudophakia OU  - s/p CE/IOL (Dr. Vilinda Blanks)  - IOLs in good position, doing well  - continue to monitor  7. Dry eyes OU - recommend artificial tears and lubricating ointment as needed  - may do better with preservative-free drops due to history of intolerance to PredForte and Prolensa  Ophthalmic Meds Ordered this visit:  Meds ordered this encounter  Medications   aflibercept (EYLEA) SOLN 2 mg     Return  in about 5 weeks (around 01/29/2023) for f/u BRVO OS, DFE, OCT.  There are no Patient Instructions on file for this visit.  Explained the diagnoses, plan, and follow up with the patient and they expressed understanding.  Patient expressed understanding of the importance of proper follow up care.   This document serves as a record of services personally performed by Gardiner Sleeper, MD, PhD. It was created on their behalf by Roselee Nova, COMT. The creation of this record is the provider's dictation and/or activities during the visit.  Electronically signed by: Roselee Nova, COMT 12/25/22 11:49 AM  This document serves as a record of services personally performed by Gardiner Sleeper, MD, PhD. It was created on  their behalf by San Jetty. Owens Shark, OA an ophthalmic technician. The creation of this record is the provider's dictation and/or activities during the visit.    Electronically signed by: San Jetty. Owens Shark, [place] 02.26.2024 11:49 AM  Gardiner Sleeper, M.D., Ph.D. Diseases & Surgery of the Retina and Vitreous [facility]  I have reviewed the above documentation for accuracy and completeness, and I agree with the above. Gardiner Sleeper, M.D., Ph.D. 12/25/22 11:49 AM  Abbreviations: M myopia (nearsighted); A astigmatism; H hyperopia (farsighted); P presbyopia; Mrx spectacle prescription;  CTL contact lenses; OD right eye; OS left eye; OU both eyes  XT exotropia; ET esotropia; PEK punctate epithelial keratitis; PEE punctate epithelial erosions; DES dry eye syndrome; MGD meibomian gland dysfunction; ATs artificial tears; PFAT's preservative free artificial tears; [place] nuclear sclerotic cataract; PSC posterior subcapsular cataract; ERM epi-retinal membrane; PVD posterior vitreous detachment; RD retinal detachment; DM diabetes mellitus; DR diabetic retinopathy; NPDR non-proliferative diabetic retinopathy; PDR proliferative diabetic retinopathy; CSME clinically significant macular edema; DME diabetic macular edema; dbh dot blot hemorrhages; CWS cotton wool spot; POAG primary open angle glaucoma; C/D cup-to-disc ratio; HVF humphrey visual field; GVF goldmann visual field; OCT optical coherence tomography; IOP intraocular pressure; BRVO Branch retinal vein occlusion; CRVO central retinal vein occlusion; CRAO central retinal artery occlusion; BRAO branch retinal artery occlusion; RT retinal tear; SB scleral buckle; PPV pars plana vitrectomy; VH Vitreous hemorrhage; PRP panretinal laser photocoagulation; IVK intravitreal kenalog; VMT vitreomacular traction; MH Macular hole;  NVD neovascularization of the disc; NVE neovascularization elsewhere; AREDS age related eye disease study; ARMD age related macular  degeneration; POAG primary open angle glaucoma; EBMD epithelial/anterior basement membrane dystrophy; ACIOL anterior chamber intraocular lens; IOL intraocular lens; PCIOL posterior chamber intraocular lens; Phaco/IOL phacoemulsification with intraocular lens placement; [place] photorefractive keratectomy; LASIK laser assisted in situ keratomileusis; HTN hypertension; DM diabetes mellitus; COPD chronic obstructive pulmonary disease

## 2022-12-25 ENCOUNTER — Ambulatory Visit (INDEPENDENT_AMBULATORY_CARE_PROVIDER_SITE_OTHER): Payer: Medicare Other | Admitting: Ophthalmology

## 2022-12-25 ENCOUNTER — Encounter (INDEPENDENT_AMBULATORY_CARE_PROVIDER_SITE_OTHER): Payer: Self-pay | Admitting: Ophthalmology

## 2022-12-25 DIAGNOSIS — H34832 Tributary (branch) retinal vein occlusion, left eye, with macular edema: Secondary | ICD-10-CM | POA: Diagnosis not present

## 2022-12-25 DIAGNOSIS — H35352 Cystoid macular degeneration, left eye: Secondary | ICD-10-CM

## 2022-12-25 DIAGNOSIS — H35033 Hypertensive retinopathy, bilateral: Secondary | ICD-10-CM | POA: Diagnosis not present

## 2022-12-25 DIAGNOSIS — Z961 Presence of intraocular lens: Secondary | ICD-10-CM | POA: Diagnosis not present

## 2022-12-25 DIAGNOSIS — H35373 Puckering of macula, bilateral: Secondary | ICD-10-CM

## 2022-12-25 DIAGNOSIS — I10 Essential (primary) hypertension: Secondary | ICD-10-CM | POA: Diagnosis not present

## 2022-12-25 DIAGNOSIS — H04123 Dry eye syndrome of bilateral lacrimal glands: Secondary | ICD-10-CM

## 2022-12-25 IMAGING — MR MR HEAD W/O CM
10 series · 48 of 48 positions shown · non-contrast
Comparison: None Available.

CLINICAL DATA: Right-sided facial numbness. Personal history of
gamma knife therapy for right-sided trigeminal neuralgia in 6222.

EXAM:
MRI HEAD WITHOUT CONTRAST
TECHNIQUE: Multiplanar, multiecho pulse sequences of the brain and surrounding
structures were obtained without intravenous contrast.

[Series 2: T1 · sagittal · 5.0mm · 0.45mm/px · 2 of 21 slices shown]
[im 1/21]
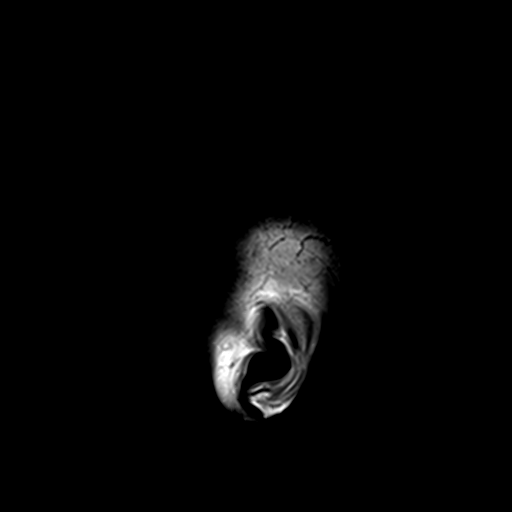
[im 21/21]
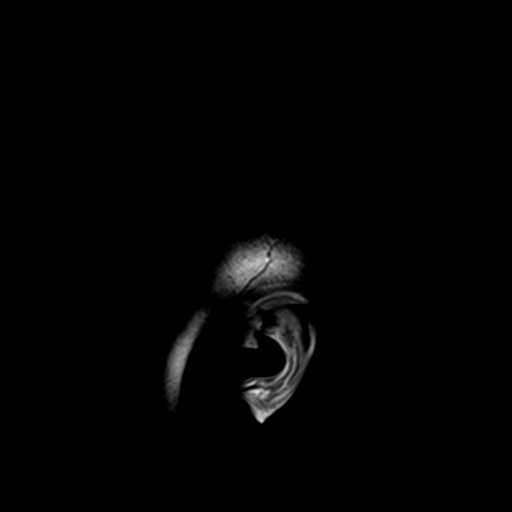

[Series 3: DWI · axial · 3.0mm · 1.80mm/px · z∈[-74,+65]mm · 9 of 94 slices shown (1 of 4)]
[im 1/94]
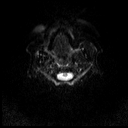
[im 12/94]
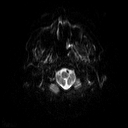
[im 24/94]
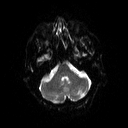
[im 35/94]
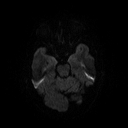
[im 47/94]
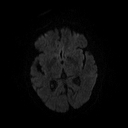
[im 59/94]
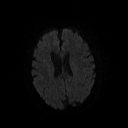
[im 70/94]
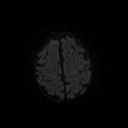
[im 82/94]
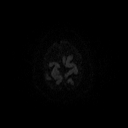
[im 94/94]
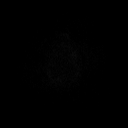

[Series 4: DWI · axial · 3.0mm · 1.80mm/px · z∈[-80,+65]mm · 4 of 44 slices shown (2 of 4)]
[im 1/44]
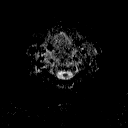
[im 15/44]
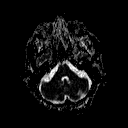
[im 29/44]
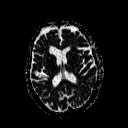
[im 44/44]
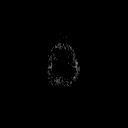

[Series 5: DWI · coronal · 5.0mm · 1.80mm/px · 6 of 66 slices shown (3 of 4)]
[im 1/66]
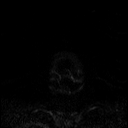
[im 14/66]
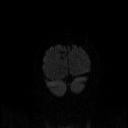
[im 27/66]
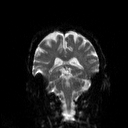
[im 40/66]
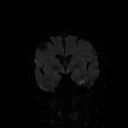
[im 53/66]
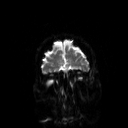
[im 66/66]
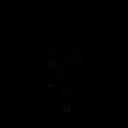

[Series 6: DWI · coronal · 5.0mm · 1.80mm/px · 3 of 35 slices shown (4 of 4)]
[im 1/35]
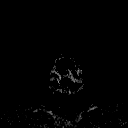
[im 18/35]
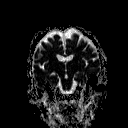
[im 35/35]
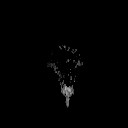

[Series 7: T2 · axial · 5.0mm · 0.60mm/px · z∈[-79,+66]mm · 2 of 22 slices shown (1 of 2)]
[im 1/22]
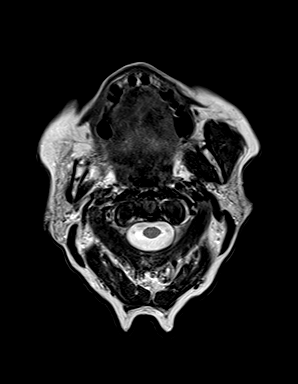
[im 22/22]
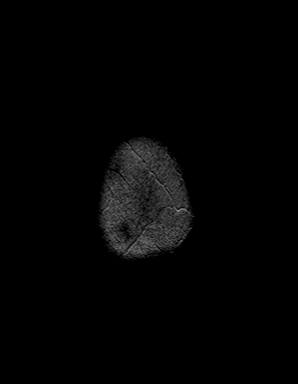

[Series 8: FLAIR · axial · 3.0mm · 0.45mm/px · z∈[-78,+63]mm · 3 of 32 slices shown]
[im 1/32]
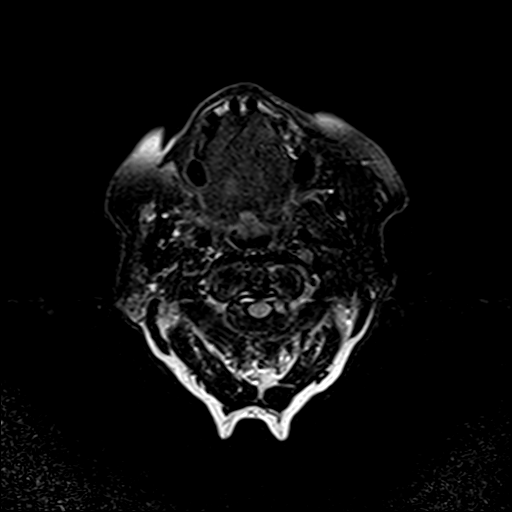
[im 16/32]
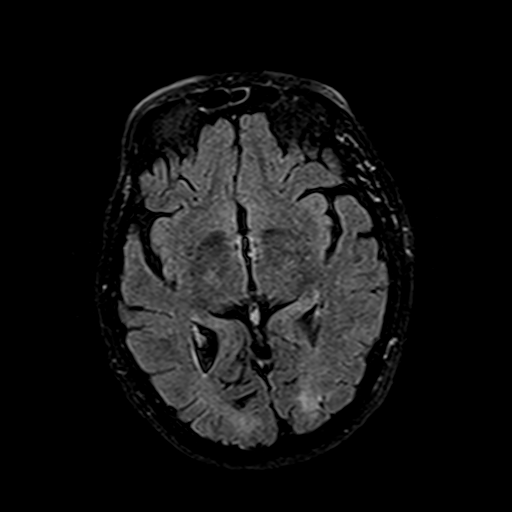
[im 32/32]
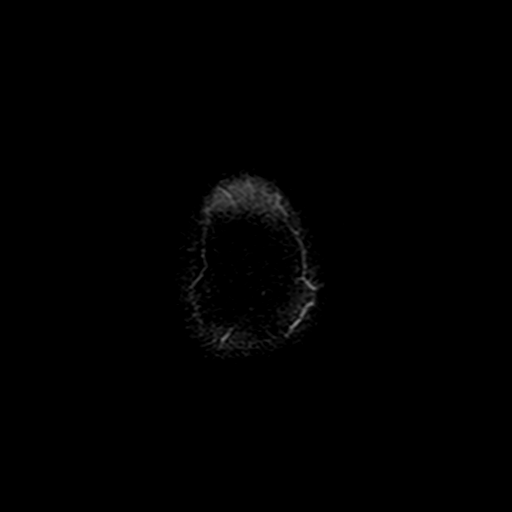

[Series 10: swi_images · axial · 4.0mm · 0.90mm/px · z∈[-76,+62]mm · 3 of 36 slices shown]
[im 1/36]
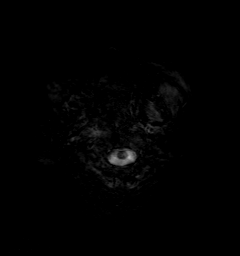
[im 18/36]
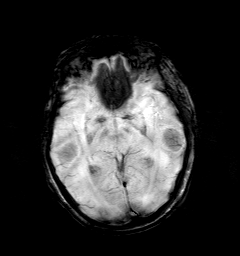
[im 36/36]
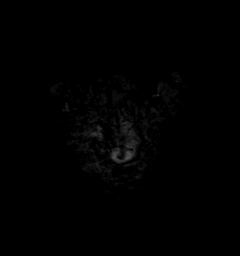

[Series 11: t1_mpr_tra · axial · 1.0mm · 0.71mm/px · z∈[-77,+64]mm · 13 of 144 slices shown]
[im 1/144]
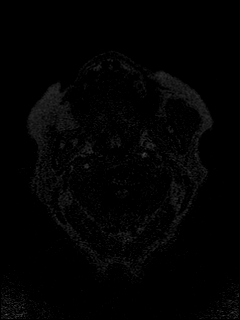
[im 12/144]
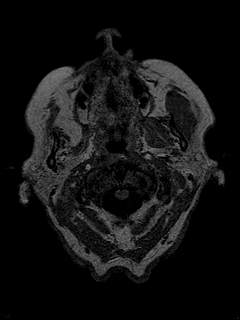
[im 24/144]
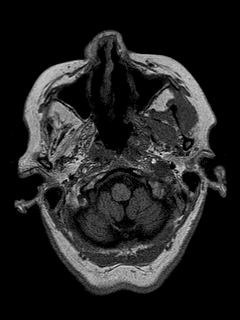
[im 36/144]
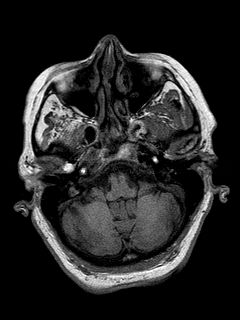
[im 48/144]
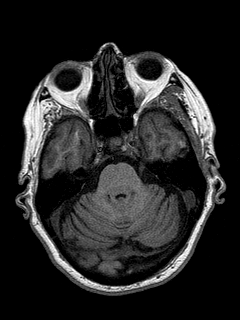
[im 60/144]
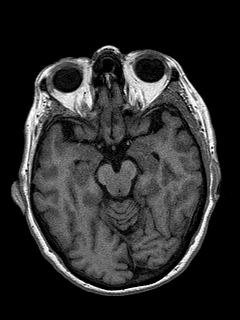
[im 72/144]
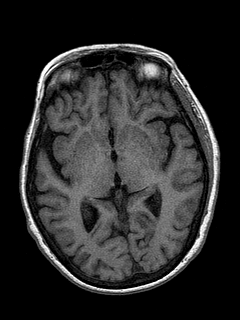
[im 84/144]
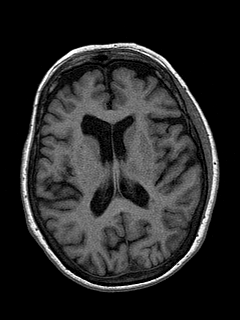
[im 96/144]
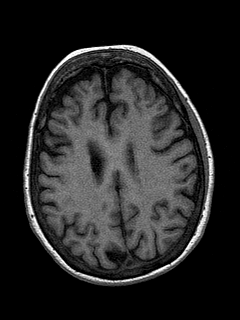
[im 108/144]
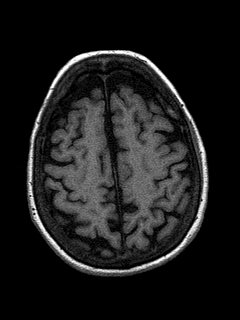
[im 120/144]
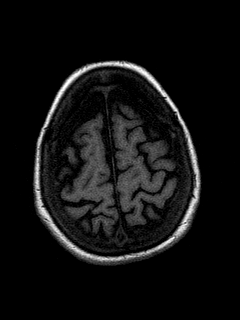
[im 132/144]
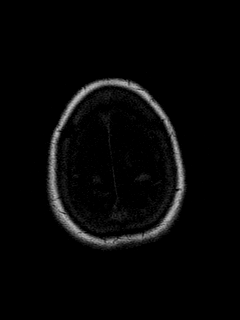
[im 144/144]
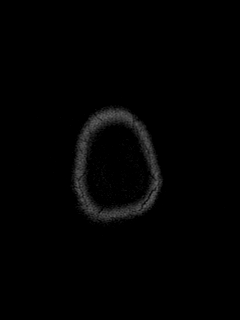

[Series 12: T2 · coronal · 5.0mm · 0.45mm/px · 3 of 28 slices shown (2 of 2)]
[im 1/28]
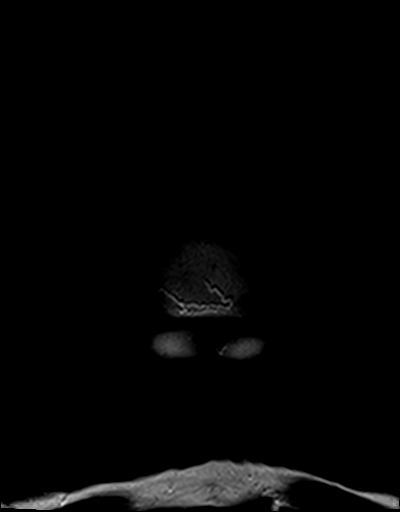
[im 14/28]
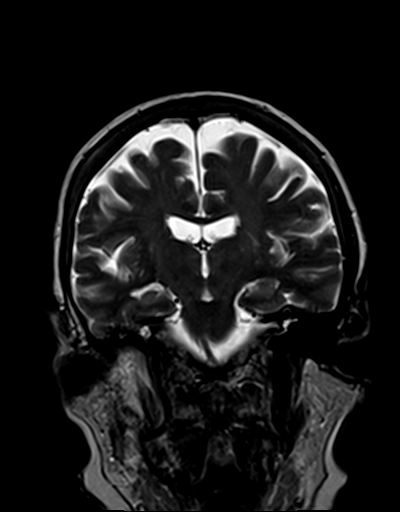
[im 28/28]
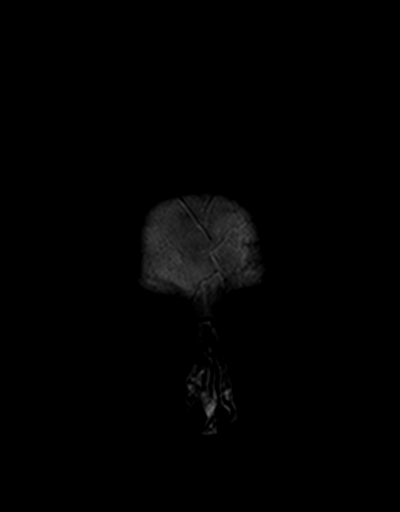

[48 of 48 positions shown; findings below may reference images not displayed]

FINDINGS: Brain: Focal encephalomalacia involving the left occipital pole is
consistent with a remote infarct. No acute infarct, hemorrhage or
mass lesion is present. Minimal white matter changes present
posterior limb of the left internal capsule. White matter changes
are otherwise within normal limits for age. Slight asymmetry is
present in the frontal horn of the right lateral ventricle. No focal
lesion is associated.

The internal auditory canals are within normal limits. The brainstem
and cerebellum are within normal limits.

Vascular: Flow is present in the major intracranial arteries.

Skull and upper cervical spine: The craniocervical junction is
normal. Upper cervical spine is within normal limits. Marrow signal
is unremarkable. Skull base foramina are within normal limits. No
focal lesions are evident.

Sinuses/Orbits: Small effusion is present a right mastoid. No
obstructing nasopharyngeal lesion is present. Mild mucosal
thickening is present in the right frontal sinus and anterior
ethmoid air cells. No fluid levels are present. Nasal cavity is
clear. Bilateral lens replacements are noted. Globes and orbits are
otherwise unremarkable.
IMPRESSION: 1. No acute intracranial abnormality. No acute or focal skull base
lesion to explain the patient's symptoms.
2. Focal encephalomalacia involving the left occipital pole
consistent with a remote infarct.
3. Minimal white matter changes are otherwise within normal limits
for age.
4. Small right mastoid effusion. No obstructing nasopharyngeal
lesion is present.

## 2022-12-25 MED ORDER — AFLIBERCEPT 2MG/0.05ML IZ SOLN FOR KALEIDOSCOPE
2.0000 mg | INTRAVITREAL | Status: AC | PRN
  Administered 2022-12-25: 2 mg via INTRAVITREAL

## 2023-01-07 NOTE — Progress Notes (Unsigned)
Cardiology Office Note   Date:  01/08/2023   ID:  Nancy Webster, DOB 1928-10-30, MRN OR:[id number]  PCP:  Garwin Brothers, MD  Cardiologist:   Dorris Carnes, MD   Patient presents for follow-up of dizziness and HTN       History of Present Illness: Nancy Webster is a 89 y.o. female with a history of SVT, DM (dx around age 43 yo), HTN, HL, GERD, CKD, trigeminal neuralgia (s/p gamma knife). In March 2022, she was orthostatic at visit   Feeling bad.   Cut back on meds    She continued to have problems with this and meds pulled back further  in APril 2022  I saw the pt in clnic in Sept 2023    Pt says she has some dizziness   usually at night  when gets up to go to [facility]   When she turns in bed she is dizzy None is as severe as in past   Now rare shakiness   Minimal Careful not to falll Takes time  No CP    Will feel heart race wihen lays down but not at other times   Brief.  BP at home   120s t0 130s   Only 1 reading very high 200/100  Came down with rest      Current Meds  Medication Sig   acetaminophen (TYLENOL) 500 MG tablet Take 500 mg by mouth every 8 (eight) hours as needed for mild pain or headache.   Biotin 5000 MCG TABS Take 1 tablet by mouth daily.   Blood Glucose Monitoring Suppl (FREESTYLE LITE) DEVI    Cholecalciferol (VITAMIN D3) 1.25 MG (50000 UT) TABS Take 1 tablet by mouth daily.   cloNIDine (CATAPRES) 0.1 MG tablet Take one tablet (0.1 mg) by mouth as needed for BP on top greater than 165 up to one time a day.   diclofenac Sodium (VOLTAREN) 1 % GEL Apply topically.   fluticasone (FLONASE) 50 MCG/ACT nasal spray Place 1 spray into both nostrils daily.   furosemide (LASIX) 20 MG tablet TAKE 1/2 TABLET = 10 MG BY MOUTH ONCE DAILY   glucose blood (FREESTYLE LITE) test strip    ibuprofen (ADVIL) 200 MG tablet Take 200 mg by mouth as needed for mild pain or moderate pain.   MEGARED OMEGA-3 KRILL OIL PO Take 1 tablet by mouth daily.   metFORMIN (GLUCOPHAGE) 500 MG tablet Take 500  mg by mouth 2 (two) times daily.   metoprolol succinate (TOPROL-XL) 25 MG 24 hr tablet Take 1 tablet (25 mg total) by mouth daily.   Multiple Vitamins-Minerals (CENTRUM SILVER ADULT 50+) TABS Take 1 tablet by mouth daily.   pantoprazole (PROTONIX) 40 MG tablet Take 40 mg by mouth daily.   Polyethyl Glycol-Propyl Glycol (SYSTANE) 0.4-0.3 % SOLN Place 1 drop into both eyes 2 (two) times daily.   REPATHA SURECLICK [id number] MG/ML SOAJ Inject '140mg'$  into the skin, in the abdomen, thigh or outer area of upper arm every 14 days   sodium chloride (OCEAN) 0.65 % SOLN nasal spray Place 1 spray into both nostrils as needed for congestion.   [DISCONTINUED] Bromfenac Sodium (PROLENSA) 0.07 % SOLN Place 1 drop into the left eye in the morning and at bedtime.   [DISCONTINUED] fluticasone (FLONASE) 50 MCG/ACT nasal spray Place 1 spray into both nostrils in the morning and at bedtime.   [DISCONTINUED] prednisoLONE acetate (PRED FORTE) 1 % ophthalmic suspension PLACE 1 DROP INTO LEFT EYE FOUR TIMES A DAY  *  WAIT 3-5 MINUTES BETWEEN 2 EYE MEDS* *SHAKE WELL* *STORE UPRIGHT*   [DISCONTINUED] PROLENSA 0.07 % SOLN PLACE 1 DROP INTO THE LEFT EYE 4 (FOUR) TIMES DAILY.   [DISCONTINUED] PROLENSA 0.07 % SOLN PLACE 1 DROP INTO LEFT EYE FOUR TIMES DAILY     Allergies:   Other, Statins, and Atorvastatin   Past Medical History:  Diagnosis Date   Anxiety    Coronary artery disease    Diabetes mellitus without complication ([place])    GERD (gastroesophageal reflux disease)    Hypertension    Renal disorder    stage 3    Past Surgical History:  Procedure Laterality Date   CATARACT EXTRACTION Bilateral    [place]   CHOLECYSTECTOMY N/A 03/02/2018   Procedure: LAPAROSCOPIC CHOLECYSTECTOMY;  Surgeon: Greer Pickerel, MD;  Location: [place];  Service: General;  Laterality: N/A;   ERCP N/A 06/26/2019   Procedure: ENDOSCOPIC RETROGRADE CHOLANGIOPANCREATOGRAPHY (ERCP);  Surgeon: [name], MD;  Location: [place];  Service:  Gastroenterology;  Laterality: N/A;   SPHINCTEROTOMY  06/26/2019   Procedure: SPHINCTEROTOMY;  Surgeon: [name], MD;  Location: [facility] ENDOSCOPY;  Service: Gastroenterology;;     Social History:  The patient  reports that she has never smoked. She has never used smokeless tobacco. She reports that she does not currently use alcohol. She reports that she does not use drugs.   Family History:  The patient's family history includes Diabetes in her daughter; Heart failure in her mother.    ROS:  Please see the history of present illness. All other systems are reviewed and  Negative to the above problem except as noted.    PHYSICAL EXAM: VS:  BP (!) 146/80   Pulse 74   Ht '5\' 3"'$  (1.6 m)   Wt 153 lb 12.8 oz (69.8 kg)   SpO2 92%   BMI 27.24 kg/m    GEN: Thin 89 yo in no acute distress  HEENT: normal  Neck: no JVD, no carotid bruits Cardiac: RRR; no murmurs  No  LE edema  Respiratory:  clear to auscultation bilaterally GI: soft, nontender, nondistended,No hepatomegaly   MS: no deformity Moving all extremities   Skin: warm and dry, no rash Neuro:  Strength and sensation are intact Psych: euthymic mood, full affect   EKG:  EKG is ordered today.    SR  67 bpm   MRI  June 2023  IMPRESSION: 1. No acute intracranial abnormality. No acute or focal skull base lesion to explain the patient's symptoms. 2. 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 3. Minimal white matter changes are otherwise within normal limits for age. 4. Small right mastoid effusion. No obstructing nasopharyngeal lesion is present   Lipid Panel    Component Value Date/Time   CHOL 137 07/10/2022 1013   TRIG 158 (H) 07/10/2022 1013   HDL 59 07/10/2022 1013   CHOLHDL 2.3 07/10/2022 1013   CHOLHDL 4.0 06/24/2019 2207   VLDL 26 06/24/2019 2207   LDLCALC 52 07/10/2022 1013      Wt Readings from Last 3 Encounters:  01/08/23 153 lb 12.8 oz (69.8 kg)  09/14/22  148 lb 12.8 oz (67.5 kg)  07/10/22 148 lb (67.1 kg)      ASSESSMENT AND PLAN:  1 Dizziness  Continues to have some dizziness  Not as bad as she did a few years ago    Letting BP run up a little     Take time     2  HTN Keep log of BP at home    Get cuff checked at [facility] to make sure accurate    Has clonidine to take prn if BP over 170/ I will see her at [facility]    3  Hx SVT  No symptoms to sugg recurrence   The brief racing of heart in bed does not appear to be SVT  4  HL    Follow   LDL in  Sept 2023 was 52  HDL 59  Trig 158  5 DM   Defer to PCP   A1C in Sept 2023 was 7.8        Current medicines are reviewed at length with the patient today.  The patient does not have concerns regarding medicines.  Signed, Dorris Carnes, MD  01/08/2023 10:29 AM    [address], [place], [place]  [address] Phone: [phone]; Fax: [phone]

## 2023-01-08 ENCOUNTER — Ambulatory Visit: Payer: Medicare Other | Attending: Internal Medicine | Admitting: Internal Medicine

## 2023-01-08 ENCOUNTER — Encounter: Payer: Self-pay | Admitting: Internal Medicine

## 2023-01-08 VITALS — BP 146/80 | HR 74 | Ht 63.0 in | Wt 153.8 lb

## 2023-01-08 DIAGNOSIS — I10 Essential (primary) hypertension: Secondary | ICD-10-CM

## 2023-01-08 NOTE — Patient Instructions (Addendum)
Medication Instructions:   Only take Clonidine for BP systolic greater that [id number] (TOP  NUMBER)   *If you need a refill on your cardiac medications before your next appointment, please call your pharmacy*   Lab Work:  If you have labs (blood work) drawn today and your tests are completely normal, you will receive your results only by: [place] (if you have MyChart) OR A paper copy in the mail If you have any lab test that is abnormal or we need to change your treatment, we will call you to review the results.   Testing/Procedures:    Follow-Up: At [facility], you and your health needs are our priority.  As part of our continuing mission to provide you with exceptional heart care, we have created designated Provider Care Teams.  These Care Teams include your primary Cardiologist (physician) and Advanced Practice Providers (APPs -  Physician Assistants and Nurse Practitioners) who all work together to provide you with the care you need, when you need it.  We recommend signing up for the patient portal called "MyChart".  Sign up information is provided on this After Visit Summary.  MyChart is used to connect with patients for Virtual Visits (Telemedicine).  Patients are able to view lab/test results, encounter notes, upcoming appointments, etc.  Non-urgent messages can be sent to your provider as well.   To learn more about what you can do with MyChart, go to [email or web address].    Your next appointment:   8 month(s)  Provider:   Dorris Carnes, MD     Other Instructions  KEEP BP LOG AND BE SURE BP CUFF IS READING ABOUT THE SAME AS AT THE RESIDENT COMMUNITY

## 2023-01-26 NOTE — Progress Notes (Signed)
[facility] Note  01/29/2023     CHIEF COMPLAINT Patient presents for Retina Follow Up  HISTORY OF PRESENT ILLNESS: Nancy Webster is a 89 y.o. female who presents to the clinic today for:   HPI     Retina Follow Up   Patient presents with  CRVO/BRVO.  In left eye.  This started years ago.  Duration of 5 weeks.  Since onset it is stable.  I, the attending physician,  performed the HPI with the patient and updated documentation appropriately.        Comments   Patient states that the vision is slightly getting better. She is using AT's OU PRN. Her blood sugar was 144.      Last edited by Bernarda Caffey, MD on 01/29/2023 11:48 AM.      Patient states that the vision is improving.   Referring physician: Garwin Brothers, MD [address] [place],  [place] [address]  HISTORICAL INFORMATION:  Selected notes from the MEDICAL RECORD NUMBER Referred by Dr. Ellie Lunch for eval of ERM OU   CURRENT MEDICATIONS: Current Outpatient Medications (Ophthalmic Drugs)  Medication Sig   Polyethyl Glycol-Propyl Glycol (SYSTANE) 0.4-0.3 % SOLN Place 1 drop into both eyes 2 (two) times daily.   No current facility-administered medications for this visit. (Ophthalmic Drugs)   Current Outpatient Medications (Other)  Medication Sig   acetaminophen (TYLENOL) 500 MG tablet Take 500 mg by mouth every 8 (eight) hours as needed for mild pain or headache.   Biotin 5000 MCG TABS Take 1 tablet by mouth daily.   Blood Glucose Monitoring Suppl (FREESTYLE LITE) DEVI    Cholecalciferol (VITAMIN D3) 1.25 MG (50000 UT) TABS Take 1 tablet by mouth daily.   cloNIDine (CATAPRES) 0.1 MG tablet Take one tablet (0.1 mg) by mouth as needed for BP on top greater than 165 up to one time a day.   diclofenac Sodium (VOLTAREN) 1 % GEL Apply topically.   fluticasone (FLONASE) 50 MCG/ACT nasal spray Place 1 spray into both nostrils daily.   furosemide (LASIX) 20 MG tablet TAKE 1/2 TABLET = 10 MG BY  MOUTH ONCE DAILY   glucose blood (FREESTYLE LITE) test strip    ibuprofen (ADVIL) 200 MG tablet Take 200 mg by mouth as needed for mild pain or moderate pain.   MEGARED OMEGA-3 KRILL OIL PO Take 1 tablet by mouth daily.   metFORMIN (GLUCOPHAGE) 500 MG tablet Take 500 mg by mouth 2 (two) times daily.   metoprolol succinate (TOPROL-XL) 25 MG 24 hr tablet Take 1 tablet (25 mg total) by mouth daily.   Multiple Vitamins-Minerals (CENTRUM SILVER ADULT 50+) TABS Take 1 tablet by mouth daily.   pantoprazole (PROTONIX) 40 MG tablet Take 40 mg by mouth daily.   REPATHA SURECLICK [id number] MG/ML SOAJ Inject 140mg  into the skin, in the abdomen, thigh or outer area of upper arm every 14 days   sodium chloride (OCEAN) 0.65 % SOLN nasal spray Place 1 spray into both nostrils as needed for congestion.   No current facility-administered medications for this visit. (Other)   REVIEW OF SYSTEMS: ROS   Positive for: Endocrine, Cardiovascular, Eyes Negative for: Constitutional, Gastrointestinal, Neurological, Skin, Genitourinary, Musculoskeletal, HENT, Respiratory, Psychiatric, Allergic/Imm, Heme/Lymph Last edited by Annie Paras, COT on 01/29/2023  9:30 AM.      ALLERGIES Allergies  Allergen Reactions   Other Other (See Comments)    Cramps to all Cholesterol medications   Statins Other (  See Comments)    "cramps"  UNKNOWN   Atorvastatin Other (See Comments)    Muscle cramps  UNKNOWN   PAST MEDICAL HISTORY Past Medical History:  Diagnosis Date   Anxiety    Coronary artery disease    Diabetes mellitus without complication    GERD (gastroesophageal reflux disease)    Hypertension    Renal disorder    stage 3   Past Surgical History:  Procedure Laterality Date   CATARACT EXTRACTION Bilateral    [place]   CHOLECYSTECTOMY N/A 03/02/2018   Procedure: LAPAROSCOPIC CHOLECYSTECTOMY;  Surgeon: Greer Pickerel, MD;  Location: [facility];  Service: General;  Laterality: N/A;   ERCP N/A 06/26/2019    Procedure: ENDOSCOPIC RETROGRADE CHOLANGIOPANCREATOGRAPHY (ERCP);  Surgeon: [name], MD;  Location: [facility];  Service: Gastroenterology;  Laterality: N/A;   SPHINCTEROTOMY  06/26/2019   Procedure: SPHINCTEROTOMY;  Surgeon: [name], MD;  Location: [facility] ENDOSCOPY;  Service: Gastroenterology;;   FAMILY HISTORY Family History  Problem Relation Age of Onset   Heart failure Mother    Diabetes Daughter    SOCIAL HISTORY Social History   Tobacco Use   Smoking status: Never   Smokeless tobacco: Never  Vaping Use   Vaping Use: Never used  Substance Use Topics   Alcohol use: Not Currently   Drug use: Never       OPHTHALMIC EXAM: Base Eye Exam     Visual Acuity (Snellen - Linear)       Right Left   Dist [place] 20/40 20/200 +1   Dist ph [place] NI NI    Correction: Glasses         Tonometry (Tonopen, 9:34 AM)       Right Left   Pressure 15 18         Pupils       Dark Light Shape React APD   Right 2 1 Round Brisk None   Left 2 1 Round Brisk None         Visual Fields       Left Right    Full Full         Extraocular Movement       Right Left    Full, Ortho Full, Ortho         Neuro/Psych     Oriented x3: Yes   Mood/Affect: Normal         Dilation     Both eyes: 1.0% Mydriacyl, 2.5% Phenylephrine @ 9:31 AM           Slit Lamp and Fundus Exam     Slit Lamp Exam       Right Left   Lids/Lashes Dermatochalasis - upper lid, mild MGD, erythemia UL and LL Dermatochalasis - upper lid, mild MGD, erythema UL and LL   Conjunctiva/Sclera White and quiet White and quiet   Cornea well healed cataract wound, mild arcus, 1+ Punctate epithelial erosions, tear film debris 1+ fine PEE, well healed cataract wound, tear film debris   Anterior Chamber Deep and quiet deep and clear, no cell or flare   Iris Round and moderately dilated Round and moderately dilated, focal atrophy and TID at 0300   Lens PC IOL in good position with open PC PC IOL in good  position with open PC   Anterior Vitreous Vitreous syneresis, Posterior vitreous detachment, vitreous condensations Vitreous syneresis, Posterior vitreous detachment         Fundus Exam       Right Left  Disc mild Pallor, Sharp rim, PPA mild Pallor, mild temporal PPA, mild cupping, sharp rim, tilted   C/D Ratio 0.5 0.65   Macula Flat, Blunted foveal reflex, mild ERM, RPE mottling, No heme or edema Flat, Blunted foveal reflex, ERM with +cystic changes, persistent central SRF; no heme   Vessels attenuated, Tortuous attenuated, Tortuous   Periphery Attached, blonde fundus Attached, blonde fundus, persistent punctate MA/IRH/SRH superior midzone along superior venule -- slightly improved -- BRVO, no edema           Refraction     Wearing Rx       Sphere Cylinder Axis   Right +0.75 Sphere    Left -1.00 +1.00 022           IMAGING AND PROCEDURES  Imaging and Procedures for 01/29/2023  OCT, Retina - OU - Both Eyes       Right Eye Quality was good. Central Foveal Thickness: 393. Progression has been stable. Findings include no IRF, no SRF, abnormal foveal contour, retinal drusen , epiretinal membrane, macular pucker, vitreomacular adhesion (ERM with Blunted foveal contour and central thickening, partial PVD-- stable).   Left Eye Quality was good. Central Foveal Thickness: 463. Progression has been stable. Findings include abnormal foveal contour, retinal drusen , epiretinal membrane, intraretinal fluid, subretinal fluid, vitreomacular adhesion (Persistent central SRF, mild interval increase in IRF centrally, persistent ERM, FTMH under ERM).   Notes *Images captured and stored on drive  Diagnosis / Impression:  ERM OU  OD: ERM with Blunted foveal contour and central thickening, partial PVD-- stable OS: Persistent central SRF, mild interval increase in IRF centrally, persistent ERM, FTMH under ERM  Clinical management:  See below  Abbreviations: NFP - Normal foveal  profile. CME - cystoid macular edema. PED - pigment epithelial detachment. IRF - intraretinal fluid. SRF - subretinal fluid. EZ - ellipsoid zone. ERM - epiretinal membrane. ORA - outer retinal atrophy. ORT - outer retinal tubulation. SRHM - subretinal hyper-reflective material. IRHM - intraretinal hyper-reflective material      Intravitreal Injection, Pharmacologic Agent - OS - Left Eye       Time Out 01/29/2023. 10:17 AM. Confirmed correct patient, procedure, site, and patient consented.   Anesthesia Topical anesthesia was used. Anesthetic medications included Lidocaine 2%, Proparacaine 0.5%.   Procedure Preparation included 5% betadine to ocular surface, eyelid speculum. A (32g) needle was used.   Injection: 2 mg aflibercept 2 MG/0.05ML   Route: Intravitreal, Site: Left Eye   NDC: [id number], Lot: [id number], Expiration date: 01/28/2024, Waste: 0 mL   Post-op Post injection exam found visual acuity of at least counting fingers. The patient tolerated the procedure well. There were no complications. The patient received written and verbal post procedure care education. Post injection medications were not given.             ASSESSMENT/PLAN:    ICD-10-CM   1. Epiretinal membrane (ERM) of both eyes  H35.373 OCT, Retina - OU - Both Eyes    2. Cystoid macular edema of left eye  H35.352     3. Essential hypertension  I10     4. Hypertensive retinopathy of both eyes  H35.033     5. Branch retinal vein occlusion of left eye with macular edema  H34.8320 Intravitreal Injection, Pharmacologic Agent - OS - Left Eye    aflibercept (EYLEA) SOLN 2 mg    6. Pseudophakia, both eyes  Z96.1     7. Dry eyes  H04.123      1,2.  Epiretinal membrane, both eyes  - +ERM w/ blunting of foveal contour OU -- OS with +IRF / persistent cystic changes - FA (11.01.22) shows mild perifoveal staining / leakage OS suggestive of CME component - repeat FA (06.06.23) shows OD: Mild, late peripapillary  staining; OS: Delayed venous return of superior venule, punctate perivascular blockage along superior venule -- superior BRVO outside of macula  - started on PF and Prolensa QID OS on 11.1.22 - s/p STK OS #1 (12.20.22) - BCVA OD: 20/40; OS: 20/300 both decreased - OCT shows OD: ERM with Blunted foveal contour and central thickening, partial PVD-- stable; OS: Persistent central SRF, mild interval increase in IRF centrally, persistent ERM, FTMH under ERM - d/c PF and Prolensa BID OS due to intolerance - STK consent signed 12.20.22  - pt wishes to hold off on surgery  3,4. Hypertensive retinopathy OU  - BP in office 6.6.23 was 171/73 - discussed importance of tight BP control - continue to monitor  5. BRVO OS - s/p IVA OS #1 (08.10.23), #2 (09.07.23), #3 (10.06.23), #4 (11.06.23), #5 (12.11.23) -- IVA resistance  - s/p IVE OS #1 (01.22.24), #2 (02.26.24) - persistent retinal hemorrhages along superior venule, superior to disc -- improving - FA 6.6.23 shows delayed venous return through superior venule suggestive of BRVO - pt also reports history of recent TIA-like episode (telephone note on 4.12.23) -- pt experienced transient visual field loss and blurred vision that resolved over several minutes  - BP at previous visit (05/05/22) -- 171/73 - all of these factors raise concern / increased risk of possible cardiovascular compromise, I.e. CVA, MI, etc. - discussed findings and case with pt's PCP -- Dr. Felipa Eth - may benefit from a cardiovascular work up with EKG, echocardiogram, carotid dopplers and lab work up / hypercoagulability work up - pt had MRI brain on 5.31.23 which showed evidence of remote infarct in L occipital pole--could be related to her episode of transient vision loss - OCT shows persistent central SRF, mild interval increase in IRF centrally, persistent ERM, FTMH under ERM at 5 wks - recommend IVE OS #3 today, 04.01.24 with follow up in 5 weeks - pt wishes to proceed with  injection - RBA of procedure discussed, questions answered - IVE informed consent obtained and signed, 01.22.24 - IVA informed consent obtained and signed, 08.10.23 (OS) - see procedure note - f/u in 5 wks, sooner prn -- DFE/OCT  6. Pseudophakia OU  - s/p CE/IOL (Dr. Vilinda Blanks)  - IOLs in good position, doing well  - continue to monitor  7. Dry eyes OU - recommend artificial tears and lubricating ointment as needed - may do better with preservative free drops due to history of intolerance with Pred Forte and Prolensa  Ophthalmic Meds Ordered this visit:  Meds ordered this encounter  Medications   aflibercept (EYLEA) SOLN 2 mg     Return in about 5 weeks (around 03/05/2023) for f/u BRVO OS , DFE, OCT, Possible, IVE, OS.  There are no Patient Instructions on file for this visit.  Explained the diagnoses, plan, and follow up with the patient and they expressed understanding.  Patient expressed understanding of the importance of proper follow up care.   This document serves as a record of services personally performed by Gardiner Sleeper, MD, PhD. It was created on their behalf by Roselee Nova, COMT. The creation of this record is the provider's dictation and/or activities during the visit.  Electronically signed by: Roselee Nova, COMT 01/29/23 11:52 AM  This document serves as a record of services personally performed by Gardiner Sleeper, MD, PhD. It was created on their behalf by Renaldo Reel, [place] an ophthalmic technician. The creation of this record is the provider's dictation and/or activities during the visit.    Electronically signed by:  Renaldo Reel, COT  04.01.24  11:52 AM  Gardiner Sleeper, M.D., Ph.D. Diseases & Surgery of the Retina and Vitreous [facility]  I have reviewed the above documentation for accuracy and completeness, and I agree with the above. Gardiner Sleeper, M.D., Ph.D. 01/29/23 11:53 AM   Abbreviations: M myopia  (nearsighted); A astigmatism; H hyperopia (farsighted); P presbyopia; Mrx spectacle prescription;  CTL contact lenses; OD right eye; OS left eye; OU both eyes  XT exotropia; ET esotropia; PEK punctate epithelial keratitis; PEE punctate epithelial erosions; DES dry eye syndrome; MGD meibomian gland dysfunction; ATs artificial tears; PFAT's preservative free artificial tears; [place] nuclear sclerotic cataract; PSC posterior subcapsular cataract; ERM epi-retinal membrane; PVD posterior vitreous detachment; RD retinal detachment; DM diabetes mellitus; DR diabetic retinopathy; NPDR non-proliferative diabetic retinopathy; PDR proliferative diabetic retinopathy; CSME clinically significant macular edema; DME diabetic macular edema; dbh dot blot hemorrhages; CWS cotton wool spot; POAG primary open angle glaucoma; C/D cup-to-disc ratio; HVF humphrey visual field; GVF goldmann visual field; OCT optical coherence tomography; IOP intraocular pressure; BRVO Branch retinal vein occlusion; CRVO central retinal vein occlusion; CRAO central retinal artery occlusion; BRAO branch retinal artery occlusion; RT retinal tear; SB scleral buckle; PPV pars plana vitrectomy; VH Vitreous hemorrhage; PRP panretinal laser photocoagulation; IVK intravitreal kenalog; VMT vitreomacular traction; MH Macular hole;  NVD neovascularization of the disc; NVE neovascularization elsewhere; AREDS age related eye disease study; ARMD age related macular degeneration; POAG primary open angle glaucoma; EBMD epithelial/anterior basement membrane dystrophy; ACIOL anterior chamber intraocular lens; IOL intraocular lens; PCIOL posterior chamber intraocular lens; Phaco/IOL phacoemulsification with intraocular lens placement; [place] photorefractive keratectomy; LASIK laser assisted in situ keratomileusis; HTN hypertension; DM diabetes mellitus; COPD chronic obstructive pulmonary disease

## 2023-01-29 ENCOUNTER — Ambulatory Visit (INDEPENDENT_AMBULATORY_CARE_PROVIDER_SITE_OTHER): Payer: Medicare Other | Admitting: Ophthalmology

## 2023-01-29 ENCOUNTER — Encounter (INDEPENDENT_AMBULATORY_CARE_PROVIDER_SITE_OTHER): Payer: Self-pay | Admitting: Ophthalmology

## 2023-01-29 DIAGNOSIS — H35033 Hypertensive retinopathy, bilateral: Secondary | ICD-10-CM | POA: Diagnosis not present

## 2023-01-29 DIAGNOSIS — I10 Essential (primary) hypertension: Secondary | ICD-10-CM

## 2023-01-29 DIAGNOSIS — H35352 Cystoid macular degeneration, left eye: Secondary | ICD-10-CM | POA: Diagnosis not present

## 2023-01-29 DIAGNOSIS — H34832 Tributary (branch) retinal vein occlusion, left eye, with macular edema: Secondary | ICD-10-CM | POA: Diagnosis not present

## 2023-01-29 DIAGNOSIS — H04123 Dry eye syndrome of bilateral lacrimal glands: Secondary | ICD-10-CM | POA: Diagnosis not present

## 2023-01-29 DIAGNOSIS — H35373 Puckering of macula, bilateral: Secondary | ICD-10-CM

## 2023-01-29 DIAGNOSIS — Z961 Presence of intraocular lens: Secondary | ICD-10-CM

## 2023-01-29 MED ORDER — AFLIBERCEPT 2MG/0.05ML IZ SOLN FOR KALEIDOSCOPE
2.0000 mg | INTRAVITREAL | Status: AC | PRN
  Administered 2023-01-29: 2 mg via INTRAVITREAL

## 2023-02-13 ENCOUNTER — Encounter: Payer: Self-pay | Admitting: Internal Medicine

## 2023-02-13 ENCOUNTER — Ambulatory Visit: Payer: Medicare Other | Admitting: Internal Medicine

## 2023-02-13 VITALS — BP 142/78 | HR 83 | Temp 97.8°F | Resp 18 | Wt 151.0 lb

## 2023-02-13 DIAGNOSIS — N1831 Chronic kidney disease, stage 3a: Secondary | ICD-10-CM | POA: Diagnosis not present

## 2023-02-13 DIAGNOSIS — I10 Essential (primary) hypertension: Secondary | ICD-10-CM | POA: Diagnosis not present

## 2023-02-13 DIAGNOSIS — E782 Mixed hyperlipidemia: Secondary | ICD-10-CM | POA: Diagnosis not present

## 2023-02-13 DIAGNOSIS — E11311 Type 2 diabetes mellitus with unspecified diabetic retinopathy with macular edema: Secondary | ICD-10-CM | POA: Diagnosis not present

## 2023-02-13 MED ORDER — AMLODIPINE BESYLATE 2.5 MG PO TABS: 2.5000 mg | ORAL_TABLET | Freq: Every day | ORAL | 11 refills | Status: DC

## 2023-02-13 NOTE — Progress Notes (Signed)
   Office Visit  Subjective   Patient ID: Nancy Webster   DOB: 04/25/1928   Age: 89 y.o.   MRN: [id number]   Chief Complaint Chief Complaint  Patient presents with   office visit    Follow up      History of Present Illness  89 years old female who is here with follow up. She has DM and check her sugar every morning and it was 154 this morning. She take metformin 500 mg twice a day and she also see eye doctor and is getting injection in her eyes. Her Last HbA1c was 7.8% in September 2023.  She has dyslipidemia and take repatha as she is intolerant to statin. Her LDL in September was target controlled.  She also has hypertension and take metoprolol 25 mg daily, her cardiologist gave her clonidine to take only when her SBP is high.  She has CKD 3a with GRR 45. She is due for labs drawn today.  Past Medical History Past Medical History:  Diagnosis Date   Anxiety    Coronary artery disease    Diabetes mellitus without complication    GERD (gastroesophageal reflux disease)    Hypertension    Renal disorder    stage 3     Allergies Allergies  Allergen Reactions   Other Other (See Comments)    Cramps to all Cholesterol medications   Statins Other (See Comments)    "cramps"  UNKNOWN   Atorvastatin Other (See Comments)    Muscle cramps  UNKNOWN     Review of Systems Review of Systems  Constitutional: Negative.   HENT: Negative.    Respiratory: Negative.    Cardiovascular: Negative.   Gastrointestinal: Negative.   Neurological: Negative.        Objective:    Vitals BP (!) 142/78 (BP Location: Left Arm, Patient Position: Sitting, Cuff Size: Normal)   Pulse 83   Temp 97.8 F (36.6 C)   Resp 18   Wt 151 lb (68.5 kg)   SpO2 98%   BMI 26.75 kg/m    Physical Examination Physical Exam Constitutional:      Appearance: Normal appearance.  Cardiovascular:     Rate and Rhythm: Normal rate and regular rhythm.     Heart sounds: Normal heart sounds.  Pulmonary:      Effort: Pulmonary effort is normal.     Breath sounds: Normal breath sounds.  Abdominal:     General: Bowel sounds are normal.     Palpations: Abdomen is soft.  Neurological:     General: No focal deficit present.     Mental Status: She is alert and oriented to person, place, and time.        Assessment & Plan:   Essential hypertension Not well controlled so will add amlodipine 2.5 mg daily to metoprolol 25 mg daily.  Diabetic retinopathy associated with type 2 diabetes mellitus (HCC) Will repeat her HbA1c today and she will continue to follow with ophthamologist  Hyperlipidemia She takes repatha injection every 14 days  Chronic kidney disease, stage 3a (HCC) Will repeat labs today.    Return in about 3 months (around 05/15/2023).   Eloisa Northern, MD

## 2023-02-13 NOTE — Assessment & Plan Note (Signed)
Will repeat her HbA1c today and she will continue to follow with ophthamologist

## 2023-02-13 NOTE — Assessment & Plan Note (Signed)
Will repeat labs today.

## 2023-02-13 NOTE — Assessment & Plan Note (Signed)
She takes repatha injection every 14 days

## 2023-02-13 NOTE — Assessment & Plan Note (Signed)
Not well controlled so will add amlodipine 2.5 mg daily to metoprolol 25 mg daily.

## 2023-02-15 LAB — CMP14 + ANION GAP
ALT: 18 IU/L (ref 0–32)
AST: 22 IU/L (ref 0–40)
Albumin/Globulin Ratio: 1.9 (ref 1.2–2.2)
Albumin: 4.5 g/dL (ref 3.6–4.6)
Alkaline Phosphatase: 62 IU/L (ref 44–121)
Anion Gap: 16 mmol/L (ref 10.0–18.0)
BUN/Creatinine Ratio: 16 (ref 12–28)
BUN: 22 mg/dL (ref 10–36)
Bilirubin Total: 0.3 mg/dL (ref 0.0–1.2)
CO2: 25 mmol/L (ref 20–29)
Calcium: 10.4 mg/dL — ABNORMAL HIGH (ref 8.7–10.3)
Chloride: 104 mmol/L (ref 96–106)
Creatinine, Ser: 1.4 mg/dL — ABNORMAL HIGH (ref 0.57–1.00)
Globulin, Total: 2.4 g/dL (ref 1.5–4.5)
Glucose: 157 mg/dL — ABNORMAL HIGH (ref 70–99)
Potassium: 5.6 mmol/L — ABNORMAL HIGH (ref 3.5–5.2)
Sodium: 145 mmol/L — ABNORMAL HIGH (ref 134–144)
Total Protein: 6.9 g/dL (ref 6.0–8.5)
eGFR: 35 mL/min/{1.73_m2} — ABNORMAL LOW (ref >59)

## 2023-02-15 LAB — HEMOGLOBIN A1C
Est. average glucose Bld gHb Est-mCnc: 169 mg/dL
Hgb A1c MFr Bld: 7.5 % — ABNORMAL HIGH (ref 4.8–5.6)

## 2023-02-15 LAB — MICROALBUMIN / CREATININE URINE RATIO
Creatinine, Urine: 93.3 mg/dL
Microalb/Creat Ratio: 6 mg/g creat (ref 0–29)
Microalbumin, Urine: 5.6 ug/mL

## 2023-02-20 ENCOUNTER — Other Ambulatory Visit: Payer: Self-pay | Admitting: Internal Medicine

## 2023-02-20 NOTE — Progress Notes (Signed)
I have spoken with her about her results, her renal function is slightly worse but she is not interested to add [place] for renal protection, she will cut back her banana and tomatoe intake and will repeatBMP in 2 weeks.

## 2023-03-01 NOTE — Progress Notes (Signed)
[facility] - Clinic Note  03/05/2023     CHIEF COMPLAINT Patient presents for Retina Follow Up  HISTORY OF PRESENT ILLNESS: Nancy Webster is a 89 y.o. female who presents to the clinic today for:   HPI     Retina Follow Up   Patient presents with  CRVO/BRVO.  In left eye.  This started 4 weeks ago.  Duration of 4 weeks.  Since onset it is stable.  I, the attending physician,  performed the HPI with the patient and updated documentation appropriately.        Comments   4 week retina follow up BRVO OS and I'VE pt is reporting vision seems little better she denies any flashes or floaters       Last edited by Rennis Chris, MD on 03/05/2023  5:18 PM.    Pt reports improved vision and improved BP  Referring physician: Maris Berger, MD [address] [place],  [place] [address]  HISTORICAL INFORMATION:  Selected notes from the MEDICAL RECORD NUMBER Referred by Dr. [name] for eval of ERM OU   CURRENT MEDICATIONS: Current Outpatient Medications (Ophthalmic Drugs)  Medication Sig   Polyethyl Glycol-Propyl Glycol (SYSTANE) 0.4-0.3 % SOLN Place 1 drop into both eyes 2 (two) times daily.   No current facility-administered medications for this visit. (Ophthalmic Drugs)   Current Outpatient Medications (Other)  Medication Sig   acetaminophen (TYLENOL) 500 MG tablet Take 500 mg by mouth every 8 (eight) hours as needed for mild pain or headache.   amLODipine (NORVASC) 2.5 MG tablet Take 1 tablet (2.5 mg total) by mouth daily.   Biotin 5000 MCG TABS Take 1 tablet by mouth daily.   Blood Glucose Monitoring Suppl (FREESTYLE LITE) DEVI    Cholecalciferol (VITAMIN D3) 1.25 MG (50000 UT) TABS Take 1 tablet by mouth daily.   cloNIDine (CATAPRES) 0.1 MG tablet Take one tablet (0.1 mg) by mouth as needed for BP on top greater than 165 up to one time a day.   diclofenac Sodium (VOLTAREN) 1 % GEL Apply topically.   fluticasone (FLONASE) 50 MCG/ACT nasal spray Place 1  spray into both nostrils daily.   furosemide (LASIX) 20 MG tablet TAKE 1/2 TABLET = 10 MG BY MOUTH ONCE DAILY   glucose blood (FREESTYLE LITE) test strip    ibuprofen (ADVIL) 200 MG tablet Take 200 mg by mouth as needed for mild pain or moderate pain.   MEGARED OMEGA-3 KRILL OIL PO Take 1 tablet by mouth daily.   metFORMIN (GLUCOPHAGE) 500 MG tablet Take 500 mg by mouth 2 (two) times daily.   metoprolol succinate (TOPROL-XL) 25 MG 24 hr tablet Take 1 tablet (25 mg total) by mouth daily.   Multiple Vitamins-Minerals (CENTRUM SILVER ADULT 50+) TABS Take 1 tablet by mouth daily.   pantoprazole (PROTONIX) 40 MG tablet Take 40 mg by mouth daily.   REPATHA SURECLICK 140 MG/ML SOAJ Inject 140mg  into the skin, in the abdomen, thigh or outer area of upper arm every 14 days   sodium chloride (OCEAN) 0.65 % SOLN nasal spray Place 1 spray into both nostrils as needed for congestion.   No current facility-administered medications for this visit. (Other)   REVIEW OF SYSTEMS: ROS   Positive for: Endocrine, Cardiovascular, Eyes Negative for: Constitutional, Gastrointestinal, Neurological, Skin, Genitourinary, Musculoskeletal, HENT, Respiratory, Psychiatric, Allergic/Imm, Heme/Lymph Last edited by Etheleen Mayhew, COT on 03/05/2023  9:22 AM.     ALLERGIES Allergies  Allergen Reactions   Other  Other (See Comments)    Cramps to all Cholesterol medications   Statins Other (See Comments)    "cramps"  UNKNOWN   Atorvastatin Other (See Comments)    Muscle cramps  UNKNOWN   PAST MEDICAL HISTORY Past Medical History:  Diagnosis Date   Anxiety    Coronary artery disease    Diabetes mellitus without complication (HCC)    GERD (gastroesophageal reflux disease)    Hypertension    Renal disorder    stage 3   Past Surgical History:  Procedure Laterality Date   CATARACT EXTRACTION Bilateral    [place]   CHOLECYSTECTOMY N/A 03/02/2018   Procedure: LAPAROSCOPIC CHOLECYSTECTOMY;  Surgeon:  Gaynelle Adu, MD;  Location: [facility] OR;  Service: General;  Laterality: N/A;   ERCP N/A 06/26/2019   Procedure: ENDOSCOPIC RETROGRADE CHOLANGIOPANCREATOGRAPHY (ERCP);  Surgeon: Kerin Salen, MD;  Location: [facility] ENDOSCOPY;  Service: Gastroenterology;  Laterality: N/A;   SPHINCTEROTOMY  06/26/2019   Procedure: SPHINCTEROTOMY;  Surgeon: Kerin Salen, MD;  Location: [facility] ENDOSCOPY;  Service: Gastroenterology;;   FAMILY HISTORY Family History  Problem Relation Age of Onset   Heart failure Mother    Diabetes Daughter    SOCIAL HISTORY Social History   Tobacco Use   Smoking status: Never   Smokeless tobacco: Never  Vaping Use   Vaping Use: Never used  Substance Use Topics   Alcohol use: Not Currently   Drug use: Never       OPHTHALMIC EXAM: Base Eye Exam     Visual Acuity (Snellen - Linear)       Right Left   Dist [place] 20/40 20/200   Dist ph  NI NI         Tonometry (Tonopen, 9:28 AM)       Right Left   Pressure 12 16         Pupils       Pupils Dark Light Shape React APD   Right PERRL 2 1 Round Brisk None   Left PERRL 2 1 Round Brisk None         Visual Fields       Left Right    Full Full         Extraocular Movement       Right Left    Full, Ortho Full, Ortho         Neuro/Psych     Oriented x3: Yes   Mood/Affect: Normal         Dilation     Both eyes: 2.5% Phenylephrine @ 9:28 AM           Slit Lamp and Fundus Exam     Slit Lamp Exam       Right Left   Lids/Lashes Dermatochalasis - upper lid, mild MGD, erythemia UL and LL Dermatochalasis - upper lid, mild MGD, erythema UL and LL   Conjunctiva/Sclera White and quiet White and quiet   Cornea well healed cataract wound, mild arcus, 1+ Punctate epithelial erosions, tear film debris 1+ fine PEE, well healed cataract wound, tear film debris   Anterior Chamber Deep and quiet deep and clear, no cell or flare   Iris Round and moderately dilated Round and moderately dilated, focal atrophy and  TID at 0300   Lens PC IOL in good position with open PC PC IOL in good position with open PC   Anterior Vitreous Vitreous syneresis, Posterior vitreous detachment, vitreous condensations Vitreous syneresis, Posterior vitreous detachment  Fundus Exam       Right Left   Disc mild Pallor, Sharp rim, PPA mild Pallor, mild temporal PPA, mild cupping, sharp rim, tilted   C/D Ratio 0.5 0.65   Macula Flat, Blunted foveal reflex, mild ERM, RPE mottling, No heme or edema Flat, Blunted foveal reflex, ERM with +cystic changes, persistent central SRF -- slightly improved, no heme   Vessels attenuated, Tortuous attenuated, Tortuous   Periphery Attached, blonde fundus Attached, blonde fundus, persistent punctate MA/IRH/SRH superior midzone along superior venule -- improved, almost resolved -- BRVO, no edema           Refraction     Wearing Rx       Sphere Cylinder Axis   Right +0.75 Sphere    Left -1.00 +1.00 022           IMAGING AND PROCEDURES  Imaging and Procedures for 03/05/2023  OCT, Retina - OU - Both Eyes       Right Eye Quality was good. Central Foveal Thickness: 391. Progression has been stable. Findings include no IRF, no SRF, abnormal foveal contour, retinal drusen , epiretinal membrane, macular pucker, vitreomacular adhesion (ERM with Blunted foveal contour and central thickening, partial PVD -- stable).   Left Eye Quality was good. Central Foveal Thickness: 449. Progression has improved. Findings include abnormal foveal contour, retinal drusen , epiretinal membrane, intraretinal fluid, subretinal fluid, vitreomacular adhesion (Mild interval improvement in central IRF/SRF, persistent ERM, FTMH under ERM).   Notes *Images captured and stored on drive  Diagnosis / Impression:  ERM OU  OD: ERM with Blunted foveal contour and central thickening, partial PVD -- stable OS: Mild interval improvement in central IRF/SRF, persistent ERM, FTMH under ERM  Clinical  management:  See below  Abbreviations: NFP - Normal foveal profile. CME - cystoid macular edema. PED - pigment epithelial detachment. IRF - intraretinal fluid. SRF - subretinal fluid. EZ - ellipsoid zone. ERM - epiretinal membrane. ORA - outer retinal atrophy. ORT - outer retinal tubulation. SRHM - subretinal hyper-reflective material. IRHM - intraretinal hyper-reflective material      Intravitreal Injection, Pharmacologic Agent - OS - Left Eye       Time Out 03/05/2023. 10:25 AM. Confirmed correct patient, procedure, site, and patient consented.   Anesthesia Topical anesthesia was used. Anesthetic medications included Lidocaine 2%, Proparacaine 0.5%.   Procedure Preparation included 5% betadine to ocular surface, eyelid speculum. A (32g) needle was used.   Injection: 2 mg aflibercept 2 MG/0.05ML   Route: Intravitreal, Site: Left Eye   NDC: L6038910, Lot: [id number], Expiration date: 02/25/2024, Waste: 0 mL   Post-op Post injection exam found visual acuity of at least counting fingers. The patient tolerated the procedure well. There were no complications. The patient received written and verbal post procedure care education. Post injection medications were not given.            ASSESSMENT/PLAN:    ICD-10-CM   1. Epiretinal membrane (ERM) of both eyes  H35.373 OCT, Retina - OU - Both Eyes    2. Cystoid macular edema of left eye  H35.352 OCT, Retina - OU - Both Eyes    3. Essential hypertension  I10     4. Hypertensive retinopathy of both eyes  H35.033     5. Branch retinal vein occlusion of left eye with macular edema  H34.8320 OCT, Retina - OU - Both Eyes    Intravitreal Injection, Pharmacologic Agent - OS - Left Eye    aflibercept (EYLEA) SOLN  2 mg    6. Pseudophakia, both eyes  Z96.1     7. Dry eyes  H04.123       1,2. Epiretinal membrane, both eyes  - +ERM w/ blunting of foveal contour OU -- OS with +IRF / persistent cystic changes - FA (11.01.22) shows  mild perifoveal staining / leakage OS suggestive of CME component - repeat FA (06.06.23) shows OD: Mild, late peripapillary staining; OS: Delayed venous return of superior venule, punctate perivascular blockage along superior venule -- superior BRVO outside of macula  - started on PF and Prolensa QID OS on 11.1.22 - s/p STK OS #1 (12.20.22) - BCVA OD: 20/40; OS: 20/200 -- both stable - OCT shows OD: ERM with Blunted foveal contour and central thickening, partial PVD -- stable; OS: Mild interval improvement in central IRF/SRF, persistent ERM, FTMH under ERM - d/c'd PF and Prolensa BID OS due to intolerance - STK consent signed 12.20.22  - pt wishes to hold off on ERM surgery  3,4. Hypertensive retinopathy OU  - BP in office 6.6.23 was 171/73 - discussed importance of tight BP control and relationship to vision and BRVO OS - continue to monitor  5. BRVO OS - s/p IVA OS #1 (08.10.23), #2 (09.07.23), #3 (10.06.23), #4 (11.06.23), #5 (12.11.23) -- IVA resistance - s/p IVE OS #1 (01.22.24), #2 (02.26.24), #3 (04.01.24) - persistent retinal hemorrhages along superior venule, superior to disc -- improving - FA 6.6.23 shows delayed venous return through superior venule suggestive of BRVO - pt also reports history of recent TIA-like episode (telephone note on 4.12.23) -- pt experienced transient visual field loss and blurred vision that resolved over several minutes  - BP at previous visit (05/05/22) -- 171/73 - all of these factors raise concern / increased risk of possible cardiovascular compromise, I.e. CVA, MI, etc. - discussed findings and case with pt's PCP -- Dr. 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 Pseudophakia OU  - s/p CE/IOL (Dr. Lisette Abu)  - IOLs in good position, doing well  - continue to monitor  7. Dry eyes OU - recommend artificial tears and lubricating ointment as needed  - may do better with preservative free drops given history of intolerance with Pred Forte and prolensa  Ophthalmic Meds Ordered this visit:  Meds ordered this encounter  Medications   aflibercept (EYLEA) SOLN 2 mg     Return in about 4 weeks (around 04/02/2023) for f/u BRVO OS, DFE, OCT, Possible Injxn.  There are no Patient Instructions on file for this visit.  Explained the diagnoses, plan, and follow up with the patient and they expressed understanding.  Patient expressed understanding of the importance of proper follow up care.   This document serves as a record of services personally performed by Karie Chimera, MD, PhD. It was created on  their behalf by Annalee Genta, COMT. The creation of this record is the provider's dictation and/or activities during the visit.  Electronically signed by: Annalee Genta, COMT 03/07/23 6:14 AM  This document serves as a record of services personally performed by Karie Chimera, MD, PhD. It was created on their behalf by Glee Arvin. Manson Passey, OA an ophthalmic technician. The creation of this record is the provider's dictation and/or activities during the visit.    Electronically signed by: Glee Arvin. Manson Passey, [place] 05.06.2024 6:14 AM  Karie Chimera, M.D., Ph.D. Diseases & Surgery of the Retina and Vitreous [facility]  I have reviewed the above documentation for  accuracy and completeness, and I agree with the above. Karie Chimera, M.D., Ph.D. 03/07/23 6:18 AM   Abbreviations: M myopia (nearsighted); A astigmatism; H hyperopia (farsighted); P presbyopia; Mrx spectacle prescription;  CTL contact lenses; OD right eye; OS left eye; OU both eyes  XT exotropia; ET esotropia; PEK punctate epithelial keratitis; PEE punctate epithelial erosions; DES dry eye syndrome; MGD meibomian gland dysfunction; ATs artificial tears; PFAT's preservative free artificial tears; NSC nuclear sclerotic cataract; PSC posterior subcapsular cataract; ERM epi-retinal membrane; PVD posterior vitreous detachment; RD retinal detachment; DM diabetes mellitus; DR diabetic retinopathy; NPDR non-proliferative diabetic retinopathy; PDR proliferative diabetic retinopathy; CSME clinically significant macular edema; DME diabetic macular edema; dbh dot blot hemorrhages; CWS cotton wool spot; POAG primary open angle glaucoma; C/D cup-to-disc ratio; HVF humphrey visual field; GVF goldmann visual field; OCT optical coherence tomography; IOP intraocular pressure; BRVO Branch retinal vein occlusion; CRVO central retinal vein occlusion; CRAO central retinal artery occlusion; BRAO branch retinal artery occlusion; RT retinal tear; SB scleral buckle; PPV pars plana vitrectomy; VH Vitreous hemorrhage; PRP panretinal laser photocoagulation; IVK intravitreal kenalog; VMT vitreomacular traction; MH Macular hole;  NVD neovascularization of the disc; NVE neovascularization elsewhere; AREDS age related eye disease study; ARMD age related macular degeneration; POAG primary open angle glaucoma; EBMD epithelial/anterior basement membrane dystrophy; ACIOL anterior chamber intraocular lens; IOL intraocular lens; PCIOL posterior chamber intraocular lens; Phaco/IOL phacoemulsification with intraocular lens placement; PRK photorefractive keratectomy; LASIK laser assisted in situ keratomileusis; HTN hypertension; DM diabetes mellitus;  COPD chronic obstructive pulmonary disease

## 2023-03-05 ENCOUNTER — Ambulatory Visit (INDEPENDENT_AMBULATORY_CARE_PROVIDER_SITE_OTHER): Payer: Medicare Other | Admitting: Ophthalmology

## 2023-03-05 ENCOUNTER — Encounter (INDEPENDENT_AMBULATORY_CARE_PROVIDER_SITE_OTHER): Payer: Self-pay | Admitting: Ophthalmology

## 2023-03-05 DIAGNOSIS — I10 Essential (primary) hypertension: Secondary | ICD-10-CM | POA: Diagnosis not present

## 2023-03-05 DIAGNOSIS — H35373 Puckering of macula, bilateral: Secondary | ICD-10-CM | POA: Diagnosis not present

## 2023-03-05 DIAGNOSIS — Z961 Presence of intraocular lens: Secondary | ICD-10-CM | POA: Diagnosis not present

## 2023-03-05 DIAGNOSIS — H04123 Dry eye syndrome of bilateral lacrimal glands: Secondary | ICD-10-CM | POA: Diagnosis not present

## 2023-03-05 DIAGNOSIS — H34832 Tributary (branch) retinal vein occlusion, left eye, with macular edema: Secondary | ICD-10-CM | POA: Diagnosis not present

## 2023-03-05 DIAGNOSIS — H35033 Hypertensive retinopathy, bilateral: Secondary | ICD-10-CM

## 2023-03-05 DIAGNOSIS — H35352 Cystoid macular degeneration, left eye: Secondary | ICD-10-CM | POA: Diagnosis not present

## 2023-03-05 MED ORDER — AFLIBERCEPT 2MG/0.05ML IZ SOLN FOR KALEIDOSCOPE
2.0000 mg | INTRAVITREAL | Status: AC | PRN
  Administered 2023-03-05: 2 mg via INTRAVITREAL

## 2023-03-22 NOTE — Progress Notes (Signed)
[facility] - Clinic Note  04/02/2023     CHIEF COMPLAINT Patient presents for Retina Follow Up  HISTORY OF PRESENT ILLNESS: Nancy Webster is a 89 y.o. female who presents to the clinic today for:   HPI     Retina Follow Up   Patient presents with  CRVO/BRVO.  In left eye.  This started months ago.  Duration of 4 weeks.  Since onset it is stable.  I, the attending physician,  performed the HPI with the patient and updated documentation appropriately.        Comments   Patient states that the eyes get tired very easy. She is using Systane OU BID/TID. Her blood sugar was 162.       Last edited by Rennis Chris, MD on 04/02/2023  5:26 PM.     Pt states vision is stable  Referring physician: Maris Berger, MD [address] [place],  [place] [address]  HISTORICAL INFORMATION:  Selected notes from the MEDICAL RECORD NUMBER Referred by Dr. [name] for eval of ERM OU   CURRENT MEDICATIONS: Current Outpatient Medications (Ophthalmic Drugs)  Medication Sig   Polyethyl Glycol-Propyl Glycol (SYSTANE) 0.4-0.3 % SOLN Place 1 drop into both eyes 2 (two) times daily.   No current facility-administered medications for this visit. (Ophthalmic Drugs)   Current Outpatient Medications (Other)  Medication Sig   acetaminophen (TYLENOL) 500 MG tablet Take 500 mg by mouth every 8 (eight) hours as needed for mild pain or headache.   amLODipine (NORVASC) 2.5 MG tablet Take 1 tablet (2.5 mg total) by mouth daily.   Biotin 5000 MCG TABS Take 1 tablet by mouth daily.   Blood Glucose Monitoring Suppl (FREESTYLE LITE) DEVI    Cholecalciferol (VITAMIN D3) 1.25 MG (50000 UT) TABS Take 1 tablet by mouth daily.   cloNIDine (CATAPRES) 0.1 MG tablet Take one tablet (0.1 mg) by mouth as needed for BP on top greater than 165 up to one time a day.   diclofenac Sodium (VOLTAREN) 1 % GEL Apply topically.   fluticasone (FLONASE) 50 MCG/ACT nasal spray PLACE 1 SPRAY INTO BOTH NOSTRILS  TWICE DAILY  *SHAKE GENTLY*   furosemide (LASIX) 20 MG tablet TAKE 1/2 TABLET = 10 MG BY MOUTH ONCE DAILY   glucose blood (FREESTYLE LITE) test strip    ibuprofen (ADVIL) 200 MG tablet Take 200 mg by mouth as needed for mild pain or moderate pain.   MEGARED OMEGA-3 KRILL OIL PO Take 1 tablet by mouth daily.   metFORMIN (GLUCOPHAGE) 500 MG tablet Take 500 mg by mouth 2 (two) times daily.   metoprolol succinate (TOPROL-XL) 25 MG 24 hr tablet Take 1 tablet (25 mg total) by mouth daily.   Multiple Vitamins-Minerals (CENTRUM SILVER ADULT 50+) TABS Take 1 tablet by mouth daily.   pantoprazole (PROTONIX) 40 MG tablet Take 40 mg by mouth daily.   REPATHA SURECLICK 140 MG/ML SOAJ Inject 140mg  into the skin, in the abdomen, thigh or outer area of upper arm every 14 days   sodium chloride (OCEAN) 0.65 % SOLN nasal spray Place 1 spray into both nostrils as needed for congestion.   No current facility-administered medications for this visit. (Other)   REVIEW OF SYSTEMS: ROS   Positive for: Endocrine, Cardiovascular, Eyes Negative for: Constitutional, Gastrointestinal, Neurological, Skin, Genitourinary, Musculoskeletal, HENT, Respiratory, Psychiatric, Allergic/Imm, Heme/Lymph Last edited by Julieanne Cotton, COT on 04/02/2023  1:30 PM.     ALLERGIES Allergies  Allergen Reactions   Other  Other (See Comments)    Cramps to all Cholesterol medications   Statins Other (See Comments)    "cramps"  UNKNOWN   Atorvastatin Other (See Comments)    Muscle cramps  UNKNOWN   PAST MEDICAL HISTORY Past Medical History:  Diagnosis Date   Anxiety    Coronary artery disease    Diabetes mellitus without complication (HCC)    GERD (gastroesophageal reflux disease)    Hypertension    Renal disorder    stage 3   Past Surgical History:  Procedure Laterality Date   CATARACT EXTRACTION Bilateral    [place]   CHOLECYSTECTOMY N/A 03/02/2018   Procedure: LAPAROSCOPIC CHOLECYSTECTOMY;  Surgeon: Gaynelle Adu, MD;  Location[facility] OR;  Service: General;  Laterality: N/A;   ERCP N/A 06/26/2019   Procedure: ENDOSCOPIC RETROGRADE CHOLANGIOPANCREATOGRAPHY (ERCP);  Surgeon: Kerin Salen, MD;  Location: [facility] ENDOSCOPY;  Service: Gastroenterology;  Laterality: N/A;   SPHINCTEROTOMY  06/26/2019   Procedure: SPHINCTEROTOMY;  Surgeon: Kerin Salen, MD;  Location: [facility] ENDOSCOPY;  Service: Gastroenterology;;   FAMILY HISTORY Family History  Problem Relation Age of Onset   Heart failure Mother    Diabetes Daughter    SOCIAL HISTORY Social History   Tobacco Use   Smoking status: Never   Smokeless tobacco: Never  Vaping Use   Vaping Use: Never used  Substance Use Topics   Alcohol use: Not Currently   Drug use: Never       OPHTHALMIC EXAM: Base Eye Exam     Visual Acuity (Snellen - Linear)       Right Left   Dist [place] 20/40 -2 20/200 +1   Dist ph [place] NI NI         Tonometry (Tonopen, 1:34 PM)       Right Left   Pressure 8 10         Pupils       Dark Light Shape React APD   Right 2 1 Round Brisk None   Left 2 1 Round Brisk None         Visual Fields       Left Right    Full Full         Extraocular Movement       Right Left    Full Full         Neuro/Psych     Oriented x3: Yes   Mood/Affect: Normal         Dilation     Both eyes: 1.0% Mydriacyl, 2.5% Phenylephrine @ 1:31 PM           Slit Lamp and Fundus Exam     Slit Lamp Exam       Right Left   Lids/Lashes Dermatochalasis - upper lid, mild MGD, erythemia UL and LL Dermatochalasis - upper lid, mild MGD, erythema UL and LL   Conjunctiva/Sclera White and quiet White and quiet   Cornea well healed cataract wound, mild arcus, 1+ Punctate epithelial erosions, tear film debris 1+ fine PEE, well healed cataract wound, tear film debris   Anterior Chamber Deep and quiet deep and clear, no cell or flare   Iris Round and moderately dilated Round and moderately dilated, focal atrophy and TID at 0300   Lens  PC IOL in good position with open PC PC IOL in good position with open PC   Anterior Vitreous Vitreous syneresis, Posterior vitreous detachment, vitreous condensations Vitreous syneresis, Posterior vitreous detachment         Fundus  Exam       Right Left   Disc mild Pallor, Sharp rim, PPA mild Pallor, mild temporal PPA, mild cupping, sharp rim, tilted   C/D Ratio 0.5 0.65   Macula Flat, Blunted foveal reflex, mild ERM, RPE mottling, No heme or edema Flat, Blunted foveal reflex, ERM with +cystic changes, persistent central SRF, no heme   Vessels attenuated, Tortuous attenuated, Tortuous   Periphery Attached, blonde fundus Attached, blonde fundus, persistent punctate MA/IRH/SRH superior midzone along superior venule -- improved, essentially resolved -- BRVO, no edema           Refraction     Wearing Rx       Sphere Cylinder Axis   Right +0.75 Sphere    Left -1.00 +1.00 022           IMAGING AND PROCEDURES  Imaging and Procedures for 04/02/2023  OCT, Retina - OU - Both Eyes       Right Eye Quality was good. Central Foveal Thickness: 395. Progression has been stable. Findings include no IRF, no SRF, abnormal foveal contour, retinal drusen , epiretinal membrane, macular pucker, vitreomacular adhesion (ERM with Blunted foveal contour and central thickening, partial PVD -- stable).   Left Eye Quality was good. Central Foveal Thickness: 456. Progression has been stable. Findings include abnormal foveal contour, retinal drusen , epiretinal membrane, intraretinal fluid, subretinal fluid, vitreomacular adhesion (Persistent central IRF/SRF, persistent ERM, FTMH under ERM).   Notes *Images captured and stored on drive  Diagnosis / Impression:  ERM OU  OD: ERM with Blunted foveal contour and central thickening, partial PVD -- stable OS: persistent central IRF/SRF, persistent ERM, FTMH under ERM  Clinical management:  See below  Abbreviations: NFP - Normal foveal profile. CME -  cystoid macular edema. PED - pigment epithelial detachment. IRF - intraretinal fluid. SRF - subretinal fluid. EZ - ellipsoid zone. ERM - epiretinal membrane. ORA - outer retinal atrophy. ORT - outer retinal tubulation. SRHM - subretinal hyper-reflective material. IRHM - intraretinal hyper-reflective material      Intravitreal Injection, Pharmacologic Agent - OS - Left Eye       Time Out 04/02/2023. 1:54 PM. Confirmed correct patient, procedure, site, and patient consented.   Anesthesia Topical anesthesia was used. Anesthetic medications included Lidocaine 2%, Proparacaine 0.5%.   Procedure Preparation included 5% betadine to ocular surface, eyelid speculum. A (32g) needle was used.   Injection: 2 mg aflibercept 2 MG/0.05ML   Route: Intravitreal, Site: Left Eye   NDC: [id number], Lot: [id number], Expiration date: 02/27/2024, Waste: 0 mL   Post-op Post injection exam found visual acuity of at least counting fingers. The patient tolerated the procedure well. There were no complications. The patient received written and verbal post procedure care education. Post injection medications were not given.            ASSESSMENT/PLAN:    ICD-10-CM   1. Epiretinal membrane (ERM) of both eyes  H35.373 OCT, Retina - OU - Both Eyes    2. Cystoid macular edema of left eye  H35.352     3. Essential hypertension  I10     4. Hypertensive retinopathy of both eyes  H35.033     5. Branch retinal vein occlusion of left eye with macular edema  H34.8320 Intravitreal Injection, Pharmacologic Agent - OS - Left Eye    aflibercept (EYLEA) SOLN 2 mg    6. Pseudophakia, both eyes  Z96.1     7. Dry eyes  H04.123  1,2. Epiretinal membrane, both eyes  - +ERM w/ blunting of foveal contour OU -- OS with +IRF / persistent cystic changes - FA (11.01.22) shows mild perifoveal staining / leakage OS suggestive of CME component - repeat FA (06.06.23) shows OD: Mild, late peripapillary staining; OS:  Delayed venous return of superior venule, punctate perivascular blockage along superior venule -- superior BRVO outside of macula  - started on PF and Prolensa QID OS on 11.1.22 - s/p STK OS #1 (12.20.22) - BCVA OD: 20/40; OS: 20/200 -- both stable - OCT shows OD: ERM with Blunted foveal contour and central thickening, partial PVD -- stable; OS: persistent central IRF/SRF, persistent ERM, FTMH under ERM - d/c'd PF and Prolensa BID OS due to intolerance - STK consent signed 12.20.22  - pt wishes to hold off on ERM surgery  3,4. Hypertensive retinopathy OU  - BP in office 6.6.23 was 171/73 - discussed importance of tight BP control and relationship to vision and BRVO OS - continue to monitor  5. BRVO OS - s/p IVA OS #1 (08.10.23), #2 (09.07.23), #3 (10.06.23), #4 (11.06.23), #5 (12.11.23) -- IVA resistance - s/p IVE OS #1 (01.22.24), #2 (02.26.24), #3 (04.01.24), #4 (05.06.24) - persistent retinal hemorrhages along superior venule, superior to disc -- improved today - FA 6.6.23 shows delayed venous return through superior venule suggestive of BRVO - pt also reports history of recent TIA-like episode (telephone note on 4.12.23) -- pt experienced transient visual field loss and blurred vision that resolved over several minutes  - BP at previous visit (05/05/22) -- 171/73 - all of these factors raise concern / increased risk of possible cardiovascular compromise, I.e. CVA, MI, etc. - discussed findings and case with pt's PCP -- Dr. 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 Pseudophakia OU  - s/p CE/IOL (Dr. Lisette Abu)  - IOLs in good position, doing well  - continue to monitor  7. Dry eyes OU - recommend artificial tears and lubricating ointment as needed  - may do better with preservative free drops given history of intolerance with Pred Forte and prolensa  Ophthalmic Meds Ordered this visit:  Meds ordered this encounter  Medications   aflibercept (EYLEA) SOLN 2 mg     Return in about 6 weeks (around 05/14/2023) for f/u BRVO OS, DFE, OCT.  There are no Patient Instructions on file for this visit.  Explained the diagnoses, plan, and follow up with the patient and they expressed understanding.  Patient expressed understanding of the importance of proper follow up care.   This document serves as a record of services personally performed by Karie Chimera, MD, PhD. It was created on their behalf by De Blanch, an ophthalmic technician. The creation of this record is the provider's dictation and/or activities during the visit.    Electronically signed by: Serita Kyle  Lodema Hong, OA, 04/02/23  6:23 PM  This document serves as a record of services personally performed by Karie Chimera, MD, PhD. It was created on their behalf by Glee Arvin. Manson Passey, OA an ophthalmic technician. The creation of this record is the provider's dictation and/or activities during the visit.    Electronically signed by: Glee Arvin. Manson Passey, [place] 06.03.2024 6:23 PM  Karie Chimera, M.D., Ph.D. Diseases & Surgery of the Retina and Vitreous [facility]  I have reviewed the above documentation for accuracy and completeness, and I agree with the above. Karie Chimera, M.D., Ph.D. 04/02/23 6:24 PM   Abbreviations: M myopia  (nearsighted); A astigmatism; H hyperopia (farsighted); P presbyopia; Mrx spectacle prescription;  CTL contact lenses; OD right eye; OS left eye; OU both eyes  XT exotropia; ET esotropia; PEK punctate epithelial keratitis; PEE punctate epithelial erosions; DES dry eye syndrome; MGD meibomian gland dysfunction; ATs artificial tears; PFAT's preservative free artificial tears; NSC nuclear sclerotic cataract; PSC posterior subcapsular cataract; ERM epi-retinal membrane; PVD posterior vitreous detachment; RD retinal detachment; DM diabetes mellitus; DR diabetic retinopathy; NPDR non-proliferative diabetic retinopathy; PDR proliferative diabetic retinopathy; CSME clinically significant macular edema; DME diabetic macular edema; dbh dot blot hemorrhages; CWS cotton wool spot; POAG primary open angle glaucoma; C/D cup-to-disc ratio; HVF humphrey visual field; GVF goldmann visual field; OCT optical coherence tomography; IOP intraocular pressure; BRVO Branch retinal vein occlusion; CRVO central retinal vein occlusion; CRAO central retinal artery occlusion; BRAO branch retinal artery occlusion; RT retinal tear; SB scleral buckle; PPV pars plana vitrectomy; VH Vitreous hemorrhage; PRP panretinal laser photocoagulation; IVK intravitreal kenalog; VMT vitreomacular traction; MH Macular hole;  NVD neovascularization of the disc; NVE neovascularization elsewhere; AREDS age related eye disease study; ARMD age related macular degeneration; POAG primary open angle glaucoma; EBMD epithelial/anterior basement membrane dystrophy; ACIOL anterior chamber intraocular lens; IOL intraocular lens; PCIOL posterior chamber intraocular lens; Phaco/IOL phacoemulsification with intraocular lens placement; PRK photorefractive keratectomy; LASIK laser assisted in situ keratomileusis; HTN hypertension; DM diabetes mellitus; COPD chronic obstructive pulmonary disease

## 2023-03-26 ENCOUNTER — Other Ambulatory Visit: Payer: Self-pay | Admitting: Internal Medicine

## 2023-04-02 ENCOUNTER — Ambulatory Visit (INDEPENDENT_AMBULATORY_CARE_PROVIDER_SITE_OTHER): Payer: Medicare Other | Admitting: Ophthalmology

## 2023-04-02 ENCOUNTER — Encounter (INDEPENDENT_AMBULATORY_CARE_PROVIDER_SITE_OTHER): Payer: Self-pay | Admitting: Ophthalmology

## 2023-04-02 DIAGNOSIS — Z961 Presence of intraocular lens: Secondary | ICD-10-CM | POA: Diagnosis not present

## 2023-04-02 DIAGNOSIS — I10 Essential (primary) hypertension: Secondary | ICD-10-CM

## 2023-04-02 DIAGNOSIS — H04123 Dry eye syndrome of bilateral lacrimal glands: Secondary | ICD-10-CM

## 2023-04-02 DIAGNOSIS — H35352 Cystoid macular degeneration, left eye: Secondary | ICD-10-CM | POA: Diagnosis not present

## 2023-04-02 DIAGNOSIS — H35373 Puckering of macula, bilateral: Secondary | ICD-10-CM

## 2023-04-02 DIAGNOSIS — H35033 Hypertensive retinopathy, bilateral: Secondary | ICD-10-CM | POA: Diagnosis not present

## 2023-04-02 DIAGNOSIS — H34832 Tributary (branch) retinal vein occlusion, left eye, with macular edema: Secondary | ICD-10-CM | POA: Diagnosis not present

## 2023-04-02 MED ORDER — AFLIBERCEPT 2MG/0.05ML IZ SOLN FOR KALEIDOSCOPE
2.0000 mg | INTRAVITREAL | Status: AC | PRN
Start: 2023-04-02 — End: 2023-04-02
  Administered 2023-04-02: 2 mg via INTRAVITREAL

## 2023-04-12 ENCOUNTER — Other Ambulatory Visit: Payer: Self-pay | Admitting: Internal Medicine

## 2023-04-12 ENCOUNTER — Ambulatory Visit: Payer: Medicare Other | Admitting: Internal Medicine

## 2023-04-12 DIAGNOSIS — E0844 Diabetes mellitus due to underlying condition with diabetic amyotrophy: Secondary | ICD-10-CM | POA: Diagnosis not present

## 2023-04-12 DIAGNOSIS — E119 Type 2 diabetes mellitus without complications: Secondary | ICD-10-CM | POA: Diagnosis not present

## 2023-04-12 DIAGNOSIS — E782 Mixed hyperlipidemia: Secondary | ICD-10-CM

## 2023-04-12 DIAGNOSIS — I10 Essential (primary) hypertension: Secondary | ICD-10-CM | POA: Diagnosis not present

## 2023-04-12 NOTE — Progress Notes (Signed)
Nurse visit blood work  

## 2023-04-18 LAB — CBC WITH DIFFERENTIAL/PLATELET
Basophils Absolute: 0.1 10*3/uL (ref 0.0–0.2)
Basos: 1 %
EOS (ABSOLUTE): 0.2 10*3/uL (ref 0.0–0.4)
Eos: 3 %
Hematocrit: 39 % (ref 34.0–46.6)
Hemoglobin: 12.6 g/dL (ref 11.1–15.9)
Immature Grans (Abs): 0 10*3/uL (ref 0.0–0.1)
Immature Granulocytes: 0 %
Lymphocytes Absolute: 4.2 10*3/uL — ABNORMAL HIGH (ref 0.7–3.1)
Lymphs: 46 %
MCH: 31 pg (ref 26.6–33.0)
MCHC: 32.3 g/dL (ref 31.5–35.7)
MCV: 96 fL (ref 79–97)
Monocytes Absolute: 0.7 10*3/uL (ref 0.1–0.9)
Monocytes: 8 %
Neutrophils Absolute: 3.8 10*3/uL (ref 1.4–7.0)
Neutrophils: 42 %
Platelets: 266 10*3/uL (ref 150–450)
RBC: 4.07 x10E6/uL (ref 3.77–5.28)
RDW: 12.4 % (ref 11.7–15.4)
WBC: 9 10*3/uL (ref 3.4–10.8)

## 2023-04-18 LAB — URINALYSIS, ROUTINE W REFLEX MICROSCOPIC
Bilirubin, UA: NEGATIVE
Glucose, UA: NEGATIVE
Ketones, UA: NEGATIVE
Leukocytes,UA: NEGATIVE
Nitrite, UA: NEGATIVE
Protein,UA: NEGATIVE
RBC, UA: NEGATIVE
Specific Gravity, UA: 1.005 (ref 1.005–1.030)
Urobilinogen, Ur: 0.2 mg/dL (ref 0.2–1.0)
pH, UA: 6.5 (ref 5.0–7.5)

## 2023-04-18 LAB — SPECIMEN STATUS REPORT

## 2023-04-18 LAB — HGB A1C W/O EAG: Hgb A1c MFr Bld: 7.4 % — ABNORMAL HIGH (ref 4.8–5.6)

## 2023-04-19 ENCOUNTER — Other Ambulatory Visit: Payer: Self-pay | Admitting: Internal Medicine

## 2023-05-15 ENCOUNTER — Other Ambulatory Visit: Payer: Self-pay | Admitting: Internal Medicine

## 2023-05-15 ENCOUNTER — Encounter: Payer: Self-pay | Admitting: Internal Medicine

## 2023-05-15 ENCOUNTER — Other Ambulatory Visit: Payer: Self-pay

## 2023-05-15 ENCOUNTER — Ambulatory Visit: Payer: Medicare Other | Admitting: Internal Medicine

## 2023-05-15 VITALS — BP 124/78 | HR 89 | Temp 97.6°F | Resp 18 | Ht 63.0 in | Wt 152.4 lb

## 2023-05-15 DIAGNOSIS — N1831 Chronic kidney disease, stage 3a: Secondary | ICD-10-CM

## 2023-05-15 DIAGNOSIS — E11311 Type 2 diabetes mellitus with unspecified diabetic retinopathy with macular edema: Secondary | ICD-10-CM | POA: Diagnosis not present

## 2023-05-15 DIAGNOSIS — E782 Mixed hyperlipidemia: Secondary | ICD-10-CM

## 2023-05-15 DIAGNOSIS — I10 Essential (primary) hypertension: Secondary | ICD-10-CM | POA: Diagnosis not present

## 2023-05-15 MED ORDER — EMPAGLIFLOZIN 25 MG PO TABS
25.0000 mg | ORAL_TABLET | Freq: Every day | ORAL | 6 refills | Status: DC
Start: 2023-05-15 — End: 2023-07-17

## 2023-05-15 NOTE — Assessment & Plan Note (Signed)
Her hemoglobin A1c was  7.5% in 4/24. She will continue to follow with retina clinic

## 2023-05-15 NOTE — Assessment & Plan Note (Signed)
Her blood pressure is good

## 2023-05-15 NOTE — Progress Notes (Signed)
   Office Visit  Subjective   Patient ID: Nancy Webster   DOB: 1928/09/22   Age: 89 y.o.   MRN: [id number]   Chief Complaint Chief Complaint  Patient presents with   Follow-up    3 month follow up     History of Present Illness 89 years old female who is here with follow up. She has DM and she check her sugar and it stay good. No hypoglycemia. She take metformin 500 mg twice a day and she also see eye doctor and is getting injection in her eyes. Her Last HbA1c was 7.5% in April 24.  She has dyslipidemia and take repatha as she is intolerant to statin. Her LDL in September was target controlled. She take repatha injection twice a month.  She also has hypertension and take  amlodipine 2.5 mg and metoprolol 25 mg daily, her cardiologist gave her clonidine to take only when her SBP is high but she says that she ahs not used it.   She has CKD 3b with GRR 35 in march 24. She is due for labs drawn today. She take lasix 20 mg 1/2 tablet PO daily.   Past Medical History Past Medical History:  Diagnosis Date   Anxiety    Coronary artery disease    Diabetes mellitus without complication (HCC)    GERD (gastroesophageal reflux disease)    Hypertension    Renal disorder    stage 3     Allergies Allergies  Allergen Reactions   Other Other (See Comments)    Cramps to all Cholesterol medications   Statins Other (See Comments)    "cramps"  UNKNOWN   Atorvastatin Other (See Comments)    Muscle cramps  UNKNOWN     Review of Systems Review of Systems  Constitutional: Negative.   HENT: Negative.    Respiratory: Negative.    Cardiovascular: Negative.   Gastrointestinal: Negative.   Neurological: Negative.        Objective:    Vitals BP 124/78 (BP Location: Left Arm, Patient Position: Sitting, Cuff Size: Normal)   Pulse 89   Temp 97.6 F (36.4 C)   Resp 18   Ht 5\' 3"  (1.6 m)   Wt 152 lb 6 oz (69.1 kg)   SpO2 96%   BMI 26.99 kg/m    Physical Examination Physical  Exam Constitutional:      Appearance: Normal appearance.  HENT:     Head: Normocephalic and atraumatic.  Cardiovascular:     Rate and Rhythm: Normal rate and regular rhythm.  Pulmonary:     Effort: Pulmonary effort is normal.     Breath sounds: Normal breath sounds.  Abdominal:     Palpations: Abdomen is soft.  Neurological:     General: No focal deficit present.     Mental Status: She is alert and oriented to person, place, and time.        Assessment & Plan:   Essential hypertension Her blood pressure is good  Diabetic retinopathy associated with type 2 diabetes mellitus (HCC) Her hemoglobin A1c was  7.5% in 4/24. She will continue to follow with retina clinic   Chronic kidney disease, stage 3a (HCC) Her kidney function was worse and with proteinurea. I will add jardiance 25 mg daily.    Return in about 2 months (around 07/16/2023).   Eloisa Northern, MD

## 2023-05-15 NOTE — Assessment & Plan Note (Signed)
Her kidney function was worse and with proteinurea. I will add jardiance 25 mg daily.

## 2023-05-17 ENCOUNTER — Other Ambulatory Visit: Payer: Self-pay | Admitting: Internal Medicine

## 2023-05-17 LAB — COMPREHENSIVE METABOLIC PANEL
ALT: 18 IU/L (ref 0–32)
AST: 23 IU/L (ref 0–40)
Albumin: 4.2 g/dL (ref 3.6–4.6)
Alkaline Phosphatase: 94 IU/L (ref 44–121)
BUN/Creatinine Ratio: 19 (ref 12–28)
BUN: 31 mg/dL (ref 10–36)
Bilirubin Total: <0.2 mg/dL (ref 0.0–1.2)
CO2: 23 mmol/L (ref 20–29)
Calcium: 10 mg/dL (ref 8.7–10.3)
Chloride: 101 mmol/L (ref 96–106)
Creatinine, Ser: 1.67 mg/dL — ABNORMAL HIGH (ref 0.57–1.00)
Globulin, Total: 2.4 g/dL (ref 1.5–4.5)
Glucose: 225 mg/dL — ABNORMAL HIGH (ref 70–99)
Potassium: 5.5 mmol/L — ABNORMAL HIGH (ref 3.5–5.2)
Sodium: 142 mmol/L (ref 134–144)
Total Protein: 6.6 g/dL (ref 6.0–8.5)
eGFR: 29 mL/min/{1.73_m2} — ABNORMAL LOW (ref >59)

## 2023-05-17 LAB — LIPID PANEL W/O CHOL/HDL RATIO
Cholesterol, Total: 113 mg/dL (ref 100–199)
HDL: 49 mg/dL (ref >39)
LDL Chol Calc (NIH): 21 mg/dL (ref 0–99)
Triglycerides: 297 mg/dL — ABNORMAL HIGH (ref 0–149)
VLDL Cholesterol Cal: 43 mg/dL — ABNORMAL HIGH (ref 5–40)

## 2023-05-17 LAB — SPECIMEN STATUS REPORT

## 2023-05-21 ENCOUNTER — Other Ambulatory Visit: Payer: Self-pay | Admitting: Internal Medicine

## 2023-05-24 ENCOUNTER — Telehealth: Payer: Self-pay | Admitting: Internal Medicine

## 2023-05-24 NOTE — Telephone Encounter (Signed)
I have spoken with her about her lab results. She tell me that Nancy Webster is making her feel bad so I have stopped that medicine and will continue to monitor her renal function.

## 2023-05-25 NOTE — Progress Notes (Signed)
[facility] - Clinic Note  05/28/2023     CHIEF COMPLAINT Patient presents for Retina Follow Up  HISTORY OF PRESENT ILLNESS: Nancy Webster is a 89 y.o. female who presents to the clinic today for:   HPI     Retina Follow Up   Patient presents with  CRVO/BRVO.  In left eye.  This started 6 weeks ago.  Duration of 6 weeks.  Since onset it is stable.  I, the attending physician,  performed the HPI with the patient and updated documentation appropriately.        Comments   6 week retina follow up and BRVO OS pt is reporting vision maybe little worse she denies any flashes or floaters she has started using presser vision       Last edited by Rennis Chris, MD on 05/28/2023 11:48 AM.     Patient states she's doing well and has not noticed any vision changes.    Referring physician: Maris Berger, MD [address] [place],  [place] [address]  HISTORICAL INFORMATION:  Selected notes from the MEDICAL RECORD NUMBER Referred by Dr. [name] for eval of ERM OU   CURRENT MEDICATIONS: Current Outpatient Medications (Ophthalmic Drugs)  Medication Sig   Polyethyl Glycol-Propyl Glycol (SYSTANE) 0.4-0.3 % SOLN Place 1 drop into both eyes 2 (two) times daily.   No current facility-administered medications for this visit. (Ophthalmic Drugs)   Current Outpatient Medications (Other)  Medication Sig   acetaminophen (TYLENOL) 500 MG tablet Take 500 mg by mouth every 8 (eight) hours as needed for mild pain or headache.   amLODipine (NORVASC) 2.5 MG tablet Take 1 tablet (2.5 mg total) by mouth daily.   Biotin 5000 MCG TABS Take 1 tablet by mouth daily.   Blood Glucose Monitoring Suppl (FREESTYLE LITE) DEVI    Cholecalciferol (VITAMIN D3) 1.25 MG (50000 UT) TABS Take 1 tablet by mouth daily.   cloNIDine (CATAPRES) 0.1 MG tablet Take one tablet (0.1 mg) by mouth as needed for BP on top greater than 165 up to one time a day.   diclofenac Sodium (VOLTAREN) 1 % GEL Apply  topically.   empagliflozin (JARDIANCE) 25 MG TABS tablet Take 1 tablet (25 mg total) by mouth daily before breakfast.   Evolocumab (REPATHA SURECLICK) 140 MG/ML SOAJ INJECT 140 MG INTO THE SKIN, IN THE ABDOMEN, THIGH, OR OUTER AREA OF UPPER ARM EVERY 14 DAYS   fluticasone (FLONASE) 50 MCG/ACT nasal spray USE 1 SPRAY IN EACH NOSTRIL TWICE DAILY *SHAKE GENTLY*   furosemide (LASIX) 20 MG tablet TAKE 1/2 TABLET = 10 MG BY MOUTH ONCE DAILY   glucose blood (FREESTYLE LITE) test strip    ibuprofen (ADVIL) 200 MG tablet Take 200 mg by mouth as needed for mild pain or moderate pain.   magnesium oxide (MAG-OX) 400 (240 Mg) MG tablet Take 200 mg by mouth daily.   MEGARED OMEGA-3 KRILL OIL PO Take 1 tablet by mouth daily.   metFORMIN (GLUCOPHAGE) 500 MG tablet TAKE 1 TABLET BY MOUTH TWICE DAILY *TAKE WITH FOOD*   metoprolol succinate (TOPROL-XL) 25 MG 24 hr tablet Take 1 tablet (25 mg total) by mouth daily.   Multiple Vitamins-Minerals (CENTRUM SILVER ADULT 50+) TABS Take 1 tablet by mouth daily.   pantoprazole (PROTONIX) 40 MG tablet TAKE 1 TABLET BY MOUTH ONCE DAILY *DO NOT CRUSH OR CHEW*   sodium chloride (OCEAN) 0.65 % SOLN nasal spray Place 1 spray into both nostrils as needed for congestion.  No current facility-administered medications for this visit. (Other)   REVIEW OF SYSTEMS: ROS   Positive for: Endocrine, Cardiovascular, Eyes Negative for: Constitutional, Gastrointestinal, Neurological, Skin, Genitourinary, Musculoskeletal, HENT, Respiratory, Psychiatric, Allergic/Imm, Heme/Lymph Last edited by Etheleen Mayhew, COT on 05/28/2023  9:27 AM.      ALLERGIES Allergies  Allergen Reactions   Other Other (See Comments)    Cramps to all Cholesterol medications   Statins Other (See Comments)    "cramps"  UNKNOWN   Atorvastatin Other (See Comments)    Muscle cramps  UNKNOWN   PAST MEDICAL HISTORY Past Medical History:  Diagnosis Date   Anxiety    Coronary artery disease     Diabetes mellitus without complication (HCC)    GERD (gastroesophageal reflux disease)    Hypertension    Renal disorder    stage 3   Past Surgical History:  Procedure Laterality Date   CATARACT EXTRACTION Bilateral    [place]   CHOLECYSTECTOMY N/A 03/02/2018   Procedure: LAPAROSCOPIC CHOLECYSTECTOMY;  Surgeon: Gaynelle Adu, MD;  Location: [facility] OR;  Service: General;  Laterality: N/A;   ERCP N/A 06/26/2019   Procedure: ENDOSCOPIC RETROGRADE CHOLANGIOPANCREATOGRAPHY (ERCP);  Surgeon: Kerin Salen, MD;  Location: [facility] ENDOSCOPY;  Service: Gastroenterology;  Laterality: N/A;   SPHINCTEROTOMY  06/26/2019   Procedure: SPHINCTEROTOMY;  Surgeon: Kerin Salen, MD;  Location: [facility] ENDOSCOPY;  Service: Gastroenterology;;   FAMILY HISTORY Family History  Problem Relation Age of Onset   Heart failure Mother    Diabetes Daughter    SOCIAL HISTORY Social History   Tobacco Use   Smoking status: Never   Smokeless tobacco: Never  Vaping Use   Vaping status: Never Used  Substance Use Topics   Alcohol use: Not Currently   Drug use: Never       OPHTHALMIC EXAM: Base Eye Exam     Visual Acuity (Snellen - Linear)       Right Left   Dist [place] 20/40 20/200   Dist ph [place] NI NI         Tonometry (Tonopen, 9:29 AM)       Right Left   Pressure 11 10         Pupils       Pupils Dark Light Shape React APD   Right PERRL 2 1 Round Brisk None   Left PERRL 2 1 Round Brisk None         Visual Fields       Left Right    Full Full         Extraocular Movement       Right Left    Full, Ortho Full, Ortho         Neuro/Psych     Oriented x3: Yes   Mood/Affect: Normal         Dilation     Both eyes: 2.5% Phenylephrine @ 9:29 AM           Slit Lamp and Fundus Exam     Slit Lamp Exam       Right Left   Lids/Lashes Dermatochalasis - upper lid, mild MGD, erythemia UL and LL Dermatochalasis - upper lid, mild MGD, erythema UL and LL   Conjunctiva/Sclera White and quiet  White and quiet   Cornea well healed cataract wound, mild arcus, 1+ Punctate epithelial erosions, tear film debris 1+ fine PEE, well healed cataract wound, tear film debris   Anterior Chamber Deep and clear deep and clear   Iris Round and  moderately dilated Round and poorly dilated 3.25, focal atrophy and TID at 0200-0300   Lens PC IOL in good position with open PC PC IOL in good position with open PC   Anterior Vitreous Vitreous syneresis, Posterior vitreous detachment, vitreous condensations Vitreous syneresis, Posterior vitreous detachment         Fundus Exam       Right Left   Disc mild Pallor, Sharp rim, PPA mild Pallor, mild temporal PPA, mild cupping, sharp rim, tilted   C/D Ratio 0.5 0.65   Macula Flat, Blunted foveal reflex, mild ERM, RPE mottling, No heme or edema Flat, Blunted foveal reflex, ERM with +cystic changes, persistent central SRF, no heme   Vessels attenuated, Tortuous attenuated, Tortuous   Periphery Attached, blonde fundus Attached, blonde fundus, persistent punctate MA/IRH/SRH superior midzone along superior venule -- improved-- BRVO, no edema           Refraction     Wearing Rx       Sphere Cylinder Axis   Right +0.75 Sphere    Left -1.00 +1.00 022           IMAGING AND PROCEDURES  Imaging and Procedures for 05/28/2023  OCT, Retina - OU - Both Eyes       Right Eye Quality was good. Central Foveal Thickness: 394. Progression has been stable. Findings include no IRF, no SRF, abnormal foveal contour, retinal drusen , epiretinal membrane, macular pucker, vitreomacular adhesion (ERM with Blunted foveal contour and central thickening, partial PVD -- stable).   Left Eye Quality was good. Central Foveal Thickness: 464. Progression has been stable. Findings include abnormal foveal contour, retinal drusen , epiretinal membrane, intraretinal fluid, subretinal fluid, vitreomacular adhesion (Persistent central IRF/SRF--slightly increased, persistent ERM,  ellipsoid disruption in ERM).   Notes *Images captured and stored on drive  Diagnosis / Impression:  ERM OU  OD: ERM with Blunted foveal contour and central thickening, partial PVD -- stable OS: Persistent central IRF/SRF--slightly increased, persistent ERM, ellipsoid disruption in ERM  Clinical management:  See below  Abbreviations: NFP - Normal foveal profile. CME - cystoid macular edema. PED - pigment epithelial detachment. IRF - intraretinal fluid. SRF - subretinal fluid. EZ - ellipsoid zone. ERM - epiretinal membrane. ORA - outer retinal atrophy. ORT - outer retinal tubulation. SRHM - subretinal hyper-reflective material. IRHM - intraretinal hyper-reflective material      Intravitreal Injection, Pharmacologic Agent - OS - Left Eye       Time Out 05/28/2023. 10:10 AM. Confirmed correct patient, procedure, site, and patient consented.   Anesthesia Topical anesthesia was used. Anesthetic medications included Lidocaine 2%, Proparacaine 0.5%.   Procedure Preparation included 5% betadine to ocular surface, eyelid speculum. A (32g) needle was used.   Injection: 2 mg aflibercept 2 MG/0.05ML   Route: Intravitreal, Site: Left Eye   NDC: L6038910, Lot: [id number], Expiration date: 06/29/2024, Waste: 0 mL   Post-op Post injection exam found visual acuity of at least counting fingers. The patient tolerated the procedure well. There were no complications. The patient received written and verbal post procedure care education. Post injection medications were not given.             ASSESSMENT/PLAN:    ICD-10-CM   1. Epiretinal membrane (ERM) of both eyes  H35.373     2. Cystoid macular edema of left eye  H35.352     3. Essential hypertension  I10     4. Hypertensive retinopathy of both eyes  H35.033 Intravitreal Injection, Pharmacologic Agent -  OS - Left Eye    aflibercept (EYLEA) SOLN 2 mg    5. Branch retinal vein occlusion of left eye with macular edema  H34.8320 OCT,  Retina - OU - Both Eyes    Intravitreal Injection, Pharmacologic Agent - OS - Left Eye    aflibercept (EYLEA) SOLN 2 mg    6. Pseudophakia, both eyes  Z96.1     7. Dry eyes  H04.123      **slightly delayed f/u -- 8 wks instead of 6**  1,2. Epiretinal membrane, both eyes  - +ERM w/ blunting of foveal contour OU -- OS with +IRF / persistent cystic changes - FA (11.01.22) shows mild perifoveal staining / leakage OS suggestive of CME component - repeat FA (06.06.23) shows OD: Mild, late peripapillary staining; OS: Delayed venous return of superior venule, punctate perivascular blockage along superior venule -- superior BRVO outside of macula  - started on PF and Prolensa QID OS on 11.1.22 - s/p STK OS #1 (12.20.22) - BCVA OD: 20/40; OS: 20/200 -- both stable - OCT shows OD: ERM with Blunted foveal contour and central thickening, partial PVD -- stable; OS: Persistent central IRF/SRF--slightly increased, persistent ERM, ellipsoid disruption in ERM - d/c'd PF and Prolensa BID OS due to intolerance - STK consent signed 12.20.22  - we repeated the discussion on ERM surgery and prolonged recovery - pt wishes to hold off on ERM surgery  3,4. Hypertensive retinopathy OU  - BP in office 6.6.23 was 171/73 - discussed importance of tight BP control and relationship to vision and BRVO OS - continue to monitor  5. BRVO OS - s/p IVA OS #1 (08.10.23), #2 (09.07.23), #3 (10.06.23), #4 (11.06.23), #5 (12.11.23) -- IVA resistance - s/p IVE OS #1 (01.22.24), #2 (02.26.24), #3 (04.01.24), #4 (05.06.24) #5 (06.03.24) - persistent retinal hemorrhages along superior venule, superior to disc -- improved today - FA 6.6.23 shows delayed venous return through superior venule suggestive of BRVO - pt also reports history of recent TIA-like episode (telephone note on 4.12.23) -- pt experienced transient visual field loss and blurred vision that resolved over several minutes  - BP at previous visit (05/05/22) --  171/73 - all of these factors raise concern / increased risk of possible cardiovascular compromise, I.e. CVA, MI, etc. - discussed findings and case with pt's PCP -- Dr. 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 Pseudophakia OU  - s/p CE/IOL (Dr. Lisette Abu)  - IOLs in good position, doing well  - continue to monitor  7. Dry eyes OU - recommend artificial tears and lubricating ointment as needed - may do better with preservative free drops, given history with Pred Forte and Prolensa  Ophthalmic Meds Ordered this visit:  Meds ordered this encounter  Medications   aflibercept (EYLEA) SOLN 2 mg     Return  in about 8 weeks (around 07/23/2023) for BRO OS, DFE, OCT, Possible, IVE, OS.  There are no Patient Instructions on file for this visit.  Explained the diagnoses, plan, and follow up with the patient and they expressed understanding.  Patient expressed understanding of the importance of proper follow up care.   This document serves as a record of services personally performed by Karie Chimera, MD, PhD. It was created on their behalf by Annalee Genta, COMT. The creation of this record is the provider's dictation and/or activities during the visit.  Electronically signed by: Annalee Genta, COMT 05/28/23 11:49  AM  This document serves as a record of services personally performed by Karie Chimera, MD, PhD. It was created on their behalf by Gerilyn Nestle, COT an ophthalmic technician. The creation of this record is the provider's dictation and/or activities during the visit.    Electronically signed by:  Charlette Caffey, COT  05/28/23 11:49 AM   Karie Chimera, M.D., Ph.D. Diseases & Surgery of the Retina and Vitreous [facility]  I have reviewed the above documentation for accuracy and completeness, and I agree with the above. Karie Chimera, M.D., Ph.D. 05/28/23 11:52 AM   Abbreviations: M myopia (nearsighted); A astigmatism; H hyperopia (farsighted); P presbyopia; Mrx spectacle prescription;  CTL contact lenses; OD right eye; OS left eye; OU both eyes  XT exotropia; ET esotropia; PEK punctate epithelial keratitis; PEE punctate epithelial erosions; DES dry eye syndrome; MGD meibomian gland dysfunction; ATs artificial tears; PFAT's preservative free artificial tears; NSC nuclear sclerotic cataract; PSC posterior subcapsular cataract; ERM epi-retinal membrane; PVD posterior vitreous detachment; RD retinal detachment; DM diabetes mellitus; DR diabetic retinopathy; NPDR non-proliferative diabetic retinopathy; PDR proliferative diabetic retinopathy; CSME clinically significant macular edema; DME diabetic macular edema; dbh dot blot hemorrhages; CWS cotton wool spot; POAG primary open angle glaucoma; C/D cup-to-disc ratio; HVF humphrey visual field; GVF goldmann visual field; OCT optical coherence tomography; IOP intraocular pressure; BRVO Branch retinal vein occlusion; CRVO central retinal vein occlusion; CRAO central retinal artery occlusion; BRAO branch retinal artery occlusion; RT retinal tear; SB scleral buckle; PPV pars plana vitrectomy; VH Vitreous hemorrhage; PRP panretinal laser photocoagulation; IVK intravitreal kenalog; VMT vitreomacular traction; MH Macular hole;   NVD neovascularization of the disc; NVE neovascularization elsewhere; AREDS age related eye disease study; ARMD age related macular degeneration; POAG primary open angle glaucoma; EBMD epithelial/anterior basement membrane dystrophy; ACIOL anterior chamber intraocular lens; IOL intraocular lens; PCIOL posterior chamber intraocular lens; Phaco/IOL phacoemulsification with intraocular lens placement; PRK photorefractive keratectomy; LASIK laser assisted in situ keratomileusis; HTN hypertension; DM diabetes mellitus; COPD chronic obstructive pulmonary disease

## 2023-05-28 ENCOUNTER — Ambulatory Visit (INDEPENDENT_AMBULATORY_CARE_PROVIDER_SITE_OTHER): Payer: Medicare Other | Admitting: Ophthalmology

## 2023-05-28 ENCOUNTER — Encounter (INDEPENDENT_AMBULATORY_CARE_PROVIDER_SITE_OTHER): Payer: Self-pay | Admitting: Ophthalmology

## 2023-05-28 DIAGNOSIS — H35352 Cystoid macular degeneration, left eye: Secondary | ICD-10-CM | POA: Diagnosis not present

## 2023-05-28 DIAGNOSIS — H04123 Dry eye syndrome of bilateral lacrimal glands: Secondary | ICD-10-CM

## 2023-05-28 DIAGNOSIS — I10 Essential (primary) hypertension: Secondary | ICD-10-CM | POA: Diagnosis not present

## 2023-05-28 DIAGNOSIS — H35373 Puckering of macula, bilateral: Secondary | ICD-10-CM | POA: Diagnosis not present

## 2023-05-28 DIAGNOSIS — H35033 Hypertensive retinopathy, bilateral: Secondary | ICD-10-CM | POA: Diagnosis not present

## 2023-05-28 DIAGNOSIS — H34832 Tributary (branch) retinal vein occlusion, left eye, with macular edema: Secondary | ICD-10-CM

## 2023-05-28 DIAGNOSIS — Z961 Presence of intraocular lens: Secondary | ICD-10-CM

## 2023-05-28 MED ORDER — AFLIBERCEPT 2MG/0.05ML IZ SOLN FOR KALEIDOSCOPE
2.0000 mg | INTRAVITREAL | Status: AC | PRN
Start: 2023-05-28 — End: 2023-05-28
  Administered 2023-05-28: 2 mg via INTRAVITREAL

## 2023-07-13 NOTE — Progress Notes (Signed)
[facility] - Clinic Note  07/16/2023     CHIEF COMPLAINT Patient presents for Retina Follow Up  HISTORY OF PRESENT ILLNESS: Nancy Webster is a 89 y.o. female who presents to the clinic today for:   HPI     Retina Follow Up   Patient presents with  CRVO/BRVO.  In left eye.  This started 8 weeks ago.  I, the attending physician,  performed the HPI with the patient and updated documentation appropriately.        Comments   Patient here for 8 weeks retina follow up for BRVO OS. Patient states vision poorly. OD might be a little bit better. Has no actual pain just bothersome. Sometimes a headache from it.       Last edited by Rennis Chris, MD on 07/16/2023  1:51 PM.    Patient states vision seems poor OD.    Referring physician: Maris Berger, MD [address] [place],  [place] [address]  HISTORICAL INFORMATION:  Selected notes from the MEDICAL RECORD NUMBER Referred by Dr. [name] for eval of ERM OU   CURRENT MEDICATIONS: Current Outpatient Medications (Ophthalmic Drugs)  Medication Sig   Polyethyl Glycol-Propyl Glycol (SYSTANE) 0.4-0.3 % SOLN Place 1 drop into both eyes 2 (two) times daily.   No current facility-administered medications for this visit. (Ophthalmic Drugs)   Current Outpatient Medications (Other)  Medication Sig   acetaminophen (TYLENOL) 500 MG tablet Take 500 mg by mouth every 8 (eight) hours as needed for mild pain or headache.   amLODipine (NORVASC) 2.5 MG tablet Take 1 tablet (2.5 mg total) by mouth daily.   Biotin 5000 MCG TABS Take 1 tablet by mouth daily.   Blood Glucose Monitoring Suppl (FREESTYLE LITE) DEVI    Cholecalciferol (VITAMIN D3) 1.25 MG (50000 UT) TABS Take 1 tablet by mouth daily.   cloNIDine (CATAPRES) 0.1 MG tablet Take one tablet (0.1 mg) by mouth as needed for BP on top greater than 165 up to one time a day.   diclofenac Sodium (VOLTAREN) 1 % GEL Apply topically.   empagliflozin (JARDIANCE) 25 MG TABS  tablet Take 1 tablet (25 mg total) by mouth daily before breakfast.   Evolocumab (REPATHA SURECLICK) 140 MG/ML SOAJ INJECT 140 MG INTO THE SKIN, IN THE ABDOMEN, THIGH, OR OUTER AREA OF UPPER ARM EVERY 14 DAYS   fluticasone (FLONASE) 50 MCG/ACT nasal spray USE 1 SPRAY IN EACH NOSTRIL TWICE DAILY *SHAKE GENTLY*   furosemide (LASIX) 20 MG tablet TAKE 1/2 TABLET = 10 MG BY MOUTH ONCE DAILY   glucose blood (FREESTYLE LITE) test strip    ibuprofen (ADVIL) 200 MG tablet Take 200 mg by mouth as needed for mild pain or moderate pain.   magnesium oxide (MAG-OX) 400 (240 Mg) MG tablet Take 200 mg by mouth daily.   MEGARED OMEGA-3 KRILL OIL PO Take 1 tablet by mouth daily.   metFORMIN (GLUCOPHAGE) 500 MG tablet TAKE 1 TABLET BY MOUTH TWICE DAILY *TAKE WITH FOOD*   metoprolol succinate (TOPROL-XL) 25 MG 24 hr tablet Take 1 tablet (25 mg total) by mouth daily.   Multiple Vitamins-Minerals (CENTRUM SILVER ADULT 50+) TABS Take 1 tablet by mouth daily.   pantoprazole (PROTONIX) 40 MG tablet TAKE 1 TABLET BY MOUTH ONCE DAILY *DO NOT CRUSH OR CHEW*   sodium chloride (OCEAN) 0.65 % SOLN nasal spray Place 1 spray into both nostrils as needed for congestion.   No current facility-administered medications for this visit. (Other)  REVIEW OF SYSTEMS: ROS   Positive for: Endocrine, Cardiovascular, Eyes Negative for: Constitutional, Gastrointestinal, Neurological, Skin, Genitourinary, Musculoskeletal, HENT, Respiratory, Psychiatric, Allergic/Imm, Heme/Lymph Last edited by Laddie Aquas, COA on 07/16/2023  9:47 AM.       ALLERGIES Allergies  Allergen Reactions   Other Other (See Comments)    Cramps to all Cholesterol medications   Statins Other (See Comments)    "cramps"  UNKNOWN   Atorvastatin Other (See Comments)    Muscle cramps  UNKNOWN   PAST MEDICAL HISTORY Past Medical History:  Diagnosis Date   Anxiety    Coronary artery disease    Diabetes mellitus without complication (HCC)    GERD  (gastroesophageal reflux disease)    Hypertension    Renal disorder    stage 3   Past Surgical History:  Procedure Laterality Date   CATARACT EXTRACTION Bilateral    [place]   CHOLECYSTECTOMY N/A 03/02/2018   Procedure: LAPAROSCOPIC CHOLECYSTECTOMY;  Surgeon: Gaynelle Adu, MD;  Location: [facility] OR;  Service: General;  Laterality: N/A;   ERCP N/A 06/26/2019   Procedure: ENDOSCOPIC RETROGRADE CHOLANGIOPANCREATOGRAPHY (ERCP);  Surgeon: Kerin Salen, MD;  Location: [facility] ENDOSCOPY;  Service: Gastroenterology;  Laterality: N/A;   SPHINCTEROTOMY  06/26/2019   Procedure: SPHINCTEROTOMY;  Surgeon: Kerin Salen, MD;  Location: [facility] ENDOSCOPY;  Service: Gastroenterology;;   FAMILY HISTORY Family History  Problem Relation Age of Onset   Heart failure Mother    Diabetes Daughter    SOCIAL HISTORY Social History   Tobacco Use   Smoking status: Never   Smokeless tobacco: Never  Vaping Use   Vaping status: Never Used  Substance Use Topics   Alcohol use: Not Currently   Drug use: Never       OPHTHALMIC EXAM: Base Eye Exam     Visual Acuity (Snellen - Linear)       Right Left   Dist [place] 20/50 -1 20/200   Dist ph [place] NI NI         Tonometry (Tonopen, 9:44 AM)       Right Left   Pressure 10 11         Pupils       Dark Light Shape React APD   Right 3 2 Round Brisk None   Left 3 2 Round Brisk None         Visual Fields (Counting fingers)       Left Right    Full Full         Extraocular Movement       Right Left    Full, Ortho Full, Ortho         Neuro/Psych     Oriented x3: Yes   Mood/Affect: Normal         Dilation     Both eyes: 1.0% Mydriacyl, 2.5% Phenylephrine @ 9:44 AM           Slit Lamp and Fundus Exam     Slit Lamp Exam       Right Left   Lids/Lashes Dermatochalasis - upper lid, mild MGD, erythemia UL and LL Dermatochalasis - upper lid, mild MGD, erythema UL and LL   Conjunctiva/Sclera White and quiet White and quiet   Cornea well  healed cataract wound, mild arcus, 1+ Punctate epithelial erosions, tear film debris 1+ fine PEE, well healed cataract wound, tear film debris   Anterior Chamber Deep and clear deep and clear   Iris Round and moderately dilated Round and poorly dilated 3.25,  focal atrophy and TID at 0200-0300   Lens PC IOL in good position with open PC PC IOL in good position with open PC   Anterior Vitreous Vitreous syneresis, Posterior vitreous detachment, vitreous condensations Vitreous syneresis, Posterior vitreous detachment         Fundus Exam       Right Left   Disc mild Pallor, Sharp rim, PPA mild Pallor, mild temporal PPA, mild cupping, sharp rim, tilted   C/D Ratio 0.5 0.65   Macula Flat, Blunted foveal reflex, mild ERM, RPE mottling, No heme or edema Flat, Blunted foveal reflex, ERM with +cystic changes--slightly imrpved, persistent central SRF, no heme   Vessels attenuated, Tortuous attenuated, Tortuous   Periphery Attached, blonde fundus Attached, blonde fundus, persistent punctate MA/IRH/SRH superior midzone along superior venule -- improved-- BRVO, no edema           Refraction     Wearing Rx       Sphere Cylinder Axis   Right +0.75 Sphere    Left -1.00 +1.00 022           IMAGING AND PROCEDURES  Imaging and Procedures for 07/16/2023  OCT, Retina - OU - Both Eyes       Right Eye Quality was good. Central Foveal Thickness: 398. Progression has been stable. Findings include no IRF, no SRF, abnormal foveal contour, retinal drusen , epiretinal membrane, macular pucker, vitreomacular adhesion (ERM with Blunted foveal contour and central thickening, partial PVD -- stable).   Left Eye Quality was good. Central Foveal Thickness: 449. Progression has improved. Findings include abnormal foveal contour, retinal drusen , epiretinal membrane, intraretinal fluid, subretinal fluid, vitreomacular adhesion (Persistent central IRF/SRF--slightly improved, persistent ERM, ellipsoid disruption  under ERM).   Notes *Images captured and stored on drive  Diagnosis / Impression:  ERM OU  OD: ERM with Blunted foveal contour and central thickening, partial PVD -- stable OS: Persistent central IRF/SRF--slightly improved, persistent ERM, ellipsoid disruption under ERM  Clinical management:  See below  Abbreviations: NFP - Normal foveal profile. CME - cystoid macular edema. PED - pigment epithelial detachment. IRF - intraretinal fluid. SRF - subretinal fluid. EZ - ellipsoid zone. ERM - epiretinal membrane. ORA - outer retinal atrophy. ORT - outer retinal tubulation. SRHM - subretinal hyper-reflective material. IRHM - intraretinal hyper-reflective material      Intravitreal Injection, Pharmacologic Agent - OS - Left Eye       Time Out 07/16/2023. 10:38 AM. Confirmed correct patient, procedure, site, and patient consented.   Anesthesia Topical anesthesia was used. Anesthetic medications included Lidocaine 2%, Proparacaine 0.5%.   Procedure Preparation included 5% betadine to ocular surface, eyelid speculum.   Injection: 2 mg aflibercept 2 MG/0.05ML   Route: Intravitreal, Site: Left Eye   NDC: L6038910, Lot: [id number], Expiration date: 08/29/2024, Waste: 0 mL   Post-op Post injection exam found visual acuity of at least counting fingers. The patient tolerated the procedure well. There were no complications. The patient received written and verbal post procedure care education.            ASSESSMENT/PLAN:    ICD-10-CM   1. Epiretinal membrane (ERM) of both eyes  H35.373 OCT, Retina - OU - Both Eyes    2. Cystoid macular edema of left eye  H35.352     3. Essential hypertension  I10     4. Hypertensive retinopathy of both eyes  H35.033     5. Branch retinal vein occlusion of left eye with macular edema  Z30.8657  Intravitreal Injection, Pharmacologic Agent - OS - Left Eye    aflibercept (EYLEA) SOLN 2 mg    6. Pseudophakia, both eyes  Z96.1     7. Dry eyes   H04.123      1,2. Epiretinal membrane, both eyes  - +ERM w/ blunting of foveal contour OU -- OS with +IRF / persistent cystic changes - FA (11.01.22) shows mild perifoveal staining / leakage OS suggestive of CME component - repeat FA (06.06.23) shows OD: Mild, late peripapillary staining; OS: Delayed venous return of superior venule, punctate perivascular blockage along superior venule -- superior BRVO outside of macula  - started on PF and Prolensa QID OS on 11.1.22 - s/p STK OS #1 (12.20.22) - BCVA OD: 20/40; OS: 20/200 -- both stable - OCT shows OD: ERM with Blunted foveal contour and central thickening, partial PVD -- stable; OS: Persistent central IRF/SRF--slightly improved, persistent ERM, ellipsoid disruption in ERM - d/c'd PF and Prolensa BID OS due to intolerance - STK consent signed 12.20.22  - we have repeated the discussion on ERM surgery and prolonged recovery - pt wishes to hold off on ERM surgery  3,4. Hypertensive retinopathy OU  - BP in office 6.6.23 was 171/73 - discussed importance of tight BP control and relationship to vision and BRVO OS - continue to monitor  5. BRVO OS - s/p IVA OS #1 (08.10.23), #2 (09.07.23), #3 (10.06.23), #4 (11.06.23), #5 (12.11.23) -- IVA resistance - s/p IVE OS #1 (01.22.24), #2 (02.26.24), #3 (04.01.24), #4 (05.06.24) #5 (06.03.24), #6 (07.29.24) - persistent retinal hemorrhages along superior venule, superior to disc -- improved today - FA 6.6.23 shows delayed venous return through superior venule suggestive of BRVO - pt also reports history of recent TIA-like episode (telephone note on 4.12.23) -- pt experienced transient visual field loss and blurred vision that resolved over several minutes  - BP at previous visit (05/05/22) -- 171/73 - all of these factors raise concern / increased risk of possible cardiovascular compromise, I.e. CVA, MI, etc. - discussed findings and case with pt's PCP -- Dr. 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 Pseudophakia OU  - s/p CE/IOL (Dr. Lisette Abu)  - IOLs in good position, doing well  - continue to monitor  7. Dry eyes OU - recommend artificial tears and lubricating ointment as needed - may do better with preservative free drops, given history with Pred Forte and prolensa  Ophthalmic Meds Ordered this visit:  Meds ordered this encounter  Medications   aflibercept (EYLEA) SOLN 2 mg     Return in about 8 weeks (around 09/10/2023) for ERM OU and BRVO OS - DFE, OCT, Possible Injxn.  There are no Patient Instructions on file for this visit.  Explained the diagnoses, plan, and follow up with the patient and they expressed understanding.  Patient  expressed understanding of the importance of proper follow up care.   This document serves as a record of services personally performed by Karie Chimera, MD, PhD. It was created on their behalf by Annalee Genta, COMT. The creation of this record is the provider's dictation and/or activities during the visit.  Electronically signed by: Annalee Genta, COMT 07/17/23 2:42 AM  Karie Chimera, M.D., Ph.D. Diseases & Surgery of the Retina and Vitreous [facility]  I have reviewed the above documentation for accuracy and  completeness, and I agree with the above. Karie Chimera, M.D., Ph.D. 07/17/23 2:42 AM  Abbreviations: M myopia (nearsighted); A astigmatism; H hyperopia (farsighted); P presbyopia; Mrx spectacle prescription;  CTL contact lenses; OD right eye; OS left eye; OU both eyes  XT exotropia; ET esotropia; PEK punctate epithelial keratitis; PEE punctate epithelial erosions; DES dry eye syndrome; MGD meibomian gland dysfunction; ATs artificial tears; PFAT's preservative free artificial tears; NSC nuclear sclerotic cataract; PSC posterior subcapsular cataract; ERM epi-retinal membrane; PVD posterior vitreous detachment; RD retinal detachment; DM diabetes mellitus; DR diabetic retinopathy; NPDR non-proliferative diabetic retinopathy; PDR proliferative diabetic retinopathy; CSME clinically significant macular edema; DME diabetic macular edema; dbh dot blot hemorrhages; CWS cotton wool spot; POAG primary open angle glaucoma; C/D cup-to-disc ratio; HVF humphrey visual field; GVF goldmann visual field; OCT optical coherence tomography; IOP intraocular pressure; BRVO Branch retinal vein occlusion; CRVO central retinal vein occlusion; CRAO central retinal artery occlusion; BRAO branch retinal artery occlusion; RT retinal tear; SB scleral buckle; PPV pars plana vitrectomy; VH Vitreous hemorrhage; PRP panretinal laser photocoagulation; IVK intravitreal kenalog; VMT vitreomacular traction; MH Macular hole;  NVD neovascularization of the disc; NVE neovascularization elsewhere; AREDS age related eye disease study; ARMD age related macular degeneration; POAG primary open angle glaucoma; EBMD epithelial/anterior basement membrane dystrophy; ACIOL anterior chamber intraocular lens; IOL intraocular lens; PCIOL posterior chamber intraocular lens; Phaco/IOL phacoemulsification with intraocular lens placement; PRK photorefractive keratectomy; LASIK laser assisted in situ keratomileusis; HTN hypertension; DM diabetes mellitus; COPD chronic  obstructive pulmonary disease

## 2023-07-16 ENCOUNTER — Ambulatory Visit (INDEPENDENT_AMBULATORY_CARE_PROVIDER_SITE_OTHER): Payer: Medicare Other | Admitting: Ophthalmology

## 2023-07-16 ENCOUNTER — Encounter (INDEPENDENT_AMBULATORY_CARE_PROVIDER_SITE_OTHER): Payer: Self-pay | Admitting: Ophthalmology

## 2023-07-16 DIAGNOSIS — Z961 Presence of intraocular lens: Secondary | ICD-10-CM

## 2023-07-16 DIAGNOSIS — H34832 Tributary (branch) retinal vein occlusion, left eye, with macular edema: Secondary | ICD-10-CM

## 2023-07-16 DIAGNOSIS — H35033 Hypertensive retinopathy, bilateral: Secondary | ICD-10-CM | POA: Diagnosis not present

## 2023-07-16 DIAGNOSIS — H04123 Dry eye syndrome of bilateral lacrimal glands: Secondary | ICD-10-CM | POA: Diagnosis not present

## 2023-07-16 DIAGNOSIS — I10 Essential (primary) hypertension: Secondary | ICD-10-CM

## 2023-07-16 DIAGNOSIS — H35373 Puckering of macula, bilateral: Secondary | ICD-10-CM | POA: Diagnosis not present

## 2023-07-16 DIAGNOSIS — H35352 Cystoid macular degeneration, left eye: Secondary | ICD-10-CM | POA: Diagnosis not present

## 2023-07-16 MED ORDER — AFLIBERCEPT 2MG/0.05ML IZ SOLN FOR KALEIDOSCOPE
2.0000 mg | INTRAVITREAL | Status: AC | PRN
Start: 2023-07-16 — End: 2023-07-16
  Administered 2023-07-16: 2 mg via INTRAVITREAL

## 2023-07-17 ENCOUNTER — Ambulatory Visit: Payer: Medicare Other | Admitting: Internal Medicine

## 2023-07-17 ENCOUNTER — Other Ambulatory Visit: Payer: Self-pay | Admitting: Internal Medicine

## 2023-07-17 ENCOUNTER — Encounter: Payer: Self-pay | Admitting: Internal Medicine

## 2023-07-17 VITALS — BP 120/68 | HR 83 | Temp 97.7°F | Resp 18 | Ht 63.0 in | Wt 148.0 lb

## 2023-07-17 DIAGNOSIS — E782 Mixed hyperlipidemia: Secondary | ICD-10-CM | POA: Diagnosis not present

## 2023-07-17 DIAGNOSIS — J310 Chronic rhinitis: Secondary | ICD-10-CM

## 2023-07-17 DIAGNOSIS — N1831 Chronic kidney disease, stage 3a: Secondary | ICD-10-CM

## 2023-07-17 DIAGNOSIS — I10 Essential (primary) hypertension: Secondary | ICD-10-CM | POA: Diagnosis not present

## 2023-07-17 DIAGNOSIS — E11311 Type 2 diabetes mellitus with unspecified diabetic retinopathy with macular edema: Secondary | ICD-10-CM

## 2023-07-17 NOTE — Assessment & Plan Note (Signed)
controlled

## 2023-07-17 NOTE — Assessment & Plan Note (Signed)
She will use eferine gauze to put in her nose if she started bleeding.

## 2023-07-17 NOTE — Assessment & Plan Note (Signed)
Her GFR was 29 on last visit, I will reapeat labs todat

## 2023-07-17 NOTE — Assessment & Plan Note (Addendum)
I will stop, metformin and start her on amaryl 2 mg daily. She will continue to fallow with eye surgeon.

## 2023-07-17 NOTE — Assessment & Plan Note (Signed)
I will repeat labs today

## 2023-07-17 NOTE — Progress Notes (Signed)
Office Visit  Subjective   Patient ID: Nancy Webster   DOB: 12/12/1927   Age: 89 y.o.   MRN: [id number]   Chief Complaint Chief Complaint  Patient presents with   Follow-up    2 month follow up     History of Present Illness 89 years old female who is here with follow up. She says that her sugar has been slightly high 130 to 140 mg/dl in the morning. Her HbA1c was 7.4% in July 24. Her GFR decrease to 29 on last lab drawn that make it CKD 4 but she did not wanted to take Jardiance because of fear of UTI. I have suggested to stop metformin as GFR is below 30. She saw eye doctor and she has retinopathy in both eyes and left eye macular edema. She is getting injection in her eyes and one she got yesterday. No hypoglycemia.   She has dyslipidemia and take repatha as she is intolerant to statin. Her LDL in September was target controlled. She take repatha injection twice a month.  She also has hypertension and take  amlodipine 2.5 mg and metoprolol 25 mg daily, her cardiologist gave her clonidine to take only when her SBP is high but she says that she ahs not used it.   She has CKD 3b with GRR 35 in march 24. She is due for labs drawn today. She take lasix 20 mg 1/2 tablet PO daily.   She also says that sometime she has right nose bleed, she has ulcer in her nose 15 years ago but stop seeing ENT.   Past Medical History Past Medical History:  Diagnosis Date   Anxiety    Coronary artery disease    Diabetes mellitus without complication (HCC)    GERD (gastroesophageal reflux disease)    Hypertension    Renal disorder    stage 3     Allergies Allergies  Allergen Reactions   Other Other (See Comments)    Cramps to all Cholesterol medications   Statins Other (See Comments)    "cramps"  UNKNOWN   Atorvastatin Other (See Comments)    Muscle cramps  UNKNOWN     Review of Systems Review of Systems  Constitutional: Negative.   HENT:  Positive for nosebleeds.   Respiratory:  Negative.    Cardiovascular: Negative.   Gastrointestinal: Negative.   Neurological: Negative.        Objective:    Vitals BP 120/68 (BP Location: Left Arm, Patient Position: Sitting, Cuff Size: Normal)   Pulse 83   Temp 97.7 F (36.5 C)   Resp 18   Ht 5\' 3"  (1.6 m)   Wt 148 lb (67.1 kg)   SpO2 95%   BMI 26.22 kg/m    Physical Examination Physical Exam Constitutional:      Appearance: Normal appearance.  HENT:     Head: Normocephalic and atraumatic.  Cardiovascular:     Rate and Rhythm: Normal rate and regular rhythm.     Heart sounds: Normal heart sounds.  Pulmonary:     Effort: Pulmonary effort is normal.     Breath sounds: Normal breath sounds.  Abdominal:     General: Bowel sounds are normal.     Palpations: Abdomen is soft.  Neurological:     General: No focal deficit present.     Mental Status: She is alert and oriented to person, place, and time.        Assessment & Plan:   Essential hypertension controlled  Rhinitis, chronic She will use eferine gauze to put in her nose if she started bleeding.  Diabetic retinopathy associated with type 2 diabetes mellitus (HCC) I will stop, metformin and start her on amaryl 2 mg daily. She will continue to fallow with eye surgeon.  Chronic kidney disease, stage 3a (HCC) Her GFR was 29 on last visit, I will reapeat labs todat  Hyperlipidemia I will repeat labs today    Return in about 3 months (around 10/16/2023).   Eloisa Northern, MD

## 2023-07-19 ENCOUNTER — Telehealth: Payer: Self-pay | Admitting: Internal Medicine

## 2023-07-19 LAB — SPECIMEN STATUS REPORT

## 2023-07-19 LAB — MICROALBUMIN / CREATININE URINE RATIO
Creatinine, Urine: 72.2 mg/dL
Microalb/Creat Ratio: 15 mg/g creat (ref 0–29)
Microalbumin, Urine: 10.9 ug/mL

## 2023-07-19 NOTE — Telephone Encounter (Signed)
I have spoken with her about her test result.

## 2023-07-24 DIAGNOSIS — Z23 Encounter for immunization: Secondary | ICD-10-CM | POA: Diagnosis not present

## 2023-08-10 ENCOUNTER — Telehealth: Payer: Self-pay

## 2023-08-10 NOTE — Telephone Encounter (Signed)
Need sooner appt ....will call after Dr Tenny Craw adds in.

## 2023-08-16 NOTE — Telephone Encounter (Signed)
OV 08/28/23

## 2023-08-21 DIAGNOSIS — L570 Actinic keratosis: Secondary | ICD-10-CM | POA: Diagnosis not present

## 2023-08-21 DIAGNOSIS — L219 Seborrheic dermatitis, unspecified: Secondary | ICD-10-CM | POA: Diagnosis not present

## 2023-08-21 DIAGNOSIS — L814 Other melanin hyperpigmentation: Secondary | ICD-10-CM | POA: Diagnosis not present

## 2023-08-21 DIAGNOSIS — L820 Inflamed seborrheic keratosis: Secondary | ICD-10-CM | POA: Diagnosis not present

## 2023-08-21 DIAGNOSIS — L821 Other seborrheic keratosis: Secondary | ICD-10-CM | POA: Diagnosis not present

## 2023-08-27 NOTE — Progress Notes (Unsigned)
Cardiology Office Note   Date:  08/28/2023   ID:  Nancy Webster, DOB April 19, 1928, MRN [id number]  PCP:  Eloisa Northern, MD  Cardiologist:   Dietrich Pates, MD   Patient presents for follow-up of HTN and dizziness     History of Present Illness: Nancy Webster is a 89 y.o. female with a history of SVT, DM (dx around age 56 yo), HTN, HL, GERD, CKD, trigeminal neuralgia (s/p gamma knife).   March 2022  Pt had significant problems with orthostatic hypotension.  Cut back on meds in march and April   Symptoms improved      I saw the pt in clnic in March 2024  Since the the pt has done well   She is active,   has a garden, walks regularly     She says she has some dizziness Worse when seh first gets up out of bed   Takes her time   Cautious     None at other times  She denies CP   No SOB     No LE edema     Sugars in 130s if she walks       Pt seen by Dr Donita Brooks  Retinopathy  Pt says her vision is  getting worse    Current Meds  Medication Sig   acetaminophen (TYLENOL) 500 MG tablet Take 500 mg by mouth every 8 (eight) hours as needed for mild pain or headache.   amLODipine (NORVASC) 2.5 MG tablet Take 1 tablet (2.5 mg total) by mouth daily.   Biotin 5000 MCG TABS Take 1 tablet by mouth daily.   Blood Glucose Monitoring Suppl (FREESTYLE LITE) DEVI    Cholecalciferol (VITAMIN D3) 1.25 MG (50000 UT) TABS Take 1 tablet by mouth daily.   cloNIDine (CATAPRES) 0.1 MG tablet Take one tablet (0.1 mg) by mouth as needed for BP on top greater than 165 up to one time a day.   diclofenac Sodium (VOLTAREN) 1 % GEL Apply topically.   Evolocumab (REPATHA SURECLICK) 140 MG/ML SOAJ INJECT 140 MG INTO THE SKIN, IN THE ABDOMEN, THIGH, OR OUTER AREA OF UPPER ARM EVERY 14 DAYS   fluticasone (FLONASE) 50 MCG/ACT nasal spray USE 1 SPRAY IN EACH NOSTRIL TWICE DAILY *SHAKE GENTLY*   furosemide (LASIX) 20 MG tablet TAKE 1/2 TABLET = 10 MG BY MOUTH ONCE DAILY   glucose blood (FREESTYLE LITE) test strip    ibuprofen  (ADVIL) 200 MG tablet Take 200 mg by mouth as needed for mild pain or moderate pain.   magnesium oxide (MAG-OX) 400 (240 Mg) MG tablet Take 200 mg by mouth daily.   MEGARED OMEGA-3 KRILL OIL PO Take 1 tablet by mouth daily.   metFORMIN (GLUCOPHAGE) 500 MG tablet TAKE 1 TABLET BY MOUTH TWICE DAILY *TAKE WITH FOOD*   metoprolol succinate (TOPROL-XL) 25 MG 24 hr tablet Take 1 tablet (25 mg total) by mouth daily.   Multiple Vitamins-Minerals (CENTRUM SILVER ADULT 50+) TABS Take 1 tablet by mouth daily.   pantoprazole (PROTONIX) 40 MG tablet TAKE 1 TABLET BY MOUTH ONCE DAILY *DO NOT CRUSH OR CHEW*   Polyethyl Glycol-Propyl Glycol (SYSTANE) 0.4-0.3 % SOLN Place 1 drop into both eyes 2 (two) times daily.   sodium chloride (OCEAN) 0.65 % SOLN nasal spray Place 1 spray into both nostrils as needed for congestion.     Allergies:   Other, Statins, and Atorvastatin   Past Medical History:  Diagnosis Date   Anxiety    Coronary  artery disease    Diabetes mellitus without complication (HCC)    GERD (gastroesophageal reflux disease)    Hypertension    Renal disorder    stage 3    Past Surgical History:  Procedure Laterality Date   CATARACT EXTRACTION Bilateral    [place]   CHOLECYSTECTOMY N/A 03/02/2018   Procedure: LAPAROSCOPIC CHOLECYSTECTOMY;  Surgeon: Gaynelle Adu, MD;  Location: [facility] OR;  Service: General;  Laterality: N/A;   ERCP N/A 06/26/2019   Procedure: ENDOSCOPIC RETROGRADE CHOLANGIOPANCREATOGRAPHY (ERCP);  Surgeon: Kerin Salen, MD;  Location: [facility] ENDOSCOPY;  Service: Gastroenterology;  Laterality: N/A;   SPHINCTEROTOMY  06/26/2019   Procedure: SPHINCTEROTOMY;  Surgeon: Kerin Salen, MD;  Location: [facility] ENDOSCOPY;  Service: Gastroenterology;;     Social History:  The patient  reports that she has never smoked. She has never used smokeless tobacco. She reports that she does not currently use alcohol. She reports that she does not use drugs.   Family History:  The patient's family history  includes Diabetes in her daughter; Heart failure in her mother.    ROS:  Please see the history of present illness. All other systems are reviewed and  Negative to the above problem except as noted.    PHYSICAL EXAM: VS:  BP (!) 140/62 (BP Location: Left Arm, Patient Position: Sitting, Cuff Size: Normal)   Pulse 72   Wt 149 lb 3.2 oz (67.7 kg)   SpO2 94%   BMI 26.43 kg/m    GEN: Pt is in NAD   HEENT: normal  Neck: JVP is normal , no carotid bruit Cardiac: RRR; no murmur  No  LE edema  Respiratory:  clear to auscultation  GI: soft, nontender,,No hepatomegaly   MS: no deformity Moving all extremities    EKG:  EKG is ordered today.    SR  72  bpm   MRI  June 2023  IMPRESSION: 1. No acute intracranial abnormality. No acute or focal skull base lesion to explain the patient's symptoms. 2. 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 3. Minimal white matter changes are otherwise within normal limits for age. 4. Small right mastoid effusion. No obstructing nasopharyngeal lesion is present   Lipid Panel    Component Value Date/Time   CHOL 123 07/17/2023 1430   TRIG 337 (H) 07/17/2023 1430   HDL 52 07/17/2023 1430   CHOLHDL 2.3 07/10/2022 1013   CHOLHDL 4.0 06/24/2019 2207   VLDL 26 06/24/2019 2207   LDLCALC 23 07/17/2023 1430      Wt Readings from Last 3 Encounters:  08/28/23 149 lb 3.2 oz (67.7 kg)  07/17/23 148 lb (67.1 kg)  05/15/23 152 lb 6 oz (69.1 kg)      ASSESSMENT AND PLAN:  1 Dizziness  Mild  Only when first gets up    Follow  I would avoid hypermanagement of BP given hx of dizziness, falls due to orthostasis in pasat  2  HTN BP at home 120s/  Keep on same meds    3  Hx SVT No recurrence   4  HL    Follow   LDL 23, HDL 52  Keep on meds   5  PAD  Pt with significant atherosclerosis of aorta   Follow  Rx risk factors  6  DM   PT on metformin   Last A1C in June 2024 was 7.4    She says sugars are better  now when walking more    5 DM  Defer to PCP   A1C in Sept 2023 was 7.8        Current medicines are reviewed at length with the patient today.  The patient does not have concerns regarding medicines.  Signed, Dietrich Pates, MD  08/28/2023 2:13 PM    [facility] [address] [place], [place], [place]  [address] Phone: [phone]; Fax: [phone]

## 2023-08-28 ENCOUNTER — Other Ambulatory Visit: Payer: Self-pay

## 2023-08-28 ENCOUNTER — Encounter: Payer: Self-pay | Admitting: Internal Medicine

## 2023-08-28 ENCOUNTER — Ambulatory Visit: Payer: Medicare Other | Attending: Internal Medicine | Admitting: Internal Medicine

## 2023-08-28 VITALS — BP 140/62 | HR 72 | Wt 149.2 lb

## 2023-08-28 DIAGNOSIS — I471 Supraventricular tachycardia, unspecified: Secondary | ICD-10-CM

## 2023-08-28 DIAGNOSIS — I10 Essential (primary) hypertension: Secondary | ICD-10-CM | POA: Diagnosis not present

## 2023-08-28 NOTE — Patient Instructions (Signed)
Medication Instructions:   *If you need a refill on your cardiac medications before your next appointment, please call your pharmacy*   Lab Work: bmet If you have labs (blood work) drawn today and your tests are completely normal, you will receive your results only by: MyChart Message (if you have MyChart) OR A paper copy in the mail If you have any lab test that is abnormal or we need to change your treatment, we will call you to review the results.   Testing/Procedures:    Follow-Up: At [facility], you and your health needs are our priority.  As part of our continuing mission to provide you with exceptional heart care, we have created designated Provider Care Teams.  These Care Teams include your primary Cardiologist (physician) and Advanced Practice Providers (APPs -  Physician Assistants and Nurse Practitioners) who all work together to provide you with the care you need, when you need it.  We recommend signing up for the patient portal called "MyChart".  Sign up information is provided on this After Visit Summary.  MyChart is used to connect with patients for Virtual Visits (Telemedicine).  Patients are able to view lab/test results, encounter notes, upcoming appointments, etc.  Non-urgent messages can be sent to your provider as well.   To learn more about what you can do with MyChart, go to [email or web address].    Your next appointment:  July 2025

## 2023-08-29 LAB — BASIC METABOLIC PANEL
BUN/Creatinine Ratio: 23 (ref 12–28)
BUN: 22 mg/dL (ref 10–36)
CO2: 25 mmol/L (ref 20–29)
Calcium: 9.5 mg/dL (ref 8.7–10.3)
Chloride: 100 mmol/L (ref 96–106)
Creatinine, Ser: 0.95 mg/dL (ref 0.57–1.00)
Glucose: 140 mg/dL — ABNORMAL HIGH (ref 70–99)
Potassium: 4.2 mmol/L (ref 3.5–5.2)
Sodium: 140 mmol/L (ref 134–144)
eGFR: 56 mL/min/{1.73_m2} — ABNORMAL LOW (ref >59)

## 2023-09-06 ENCOUNTER — Encounter: Payer: Self-pay | Admitting: Internal Medicine

## 2023-09-06 ENCOUNTER — Other Ambulatory Visit: Payer: Self-pay | Admitting: Internal Medicine

## 2023-09-06 ENCOUNTER — Ambulatory Visit: Payer: Medicare Other | Admitting: Internal Medicine

## 2023-09-06 VITALS — BP 126/80 | HR 82 | Temp 98.0°F | Resp 18 | Ht 63.0 in | Wt 153.4 lb

## 2023-09-06 DIAGNOSIS — R591 Generalized enlarged lymph nodes: Secondary | ICD-10-CM | POA: Diagnosis not present

## 2023-09-06 MED ORDER — AMOXICILLIN 500 MG PO CAPS: 500.0000 mg | ORAL_CAPSULE | Freq: Two times a day (BID) | ORAL | 0 refills | Status: AC

## 2023-09-06 NOTE — Progress Notes (Signed)
Office Visit  Subjective   Patient ID: Nancy Webster   DOB: [birth year]   Age: 89 y.o.   MRN: [id number]   Chief Complaint No chief complaint on file.    History of Present Illness Nancy Webster is a 89 yo female who comes in today for an acute visit for pain on her right cheek and a lump under her right mandible.  She states this all started about 1 hour ago where she started have a dull aching sore pain on the inside of her mouth that also involves her right cheek/jaw and upper to her right forehead.  There is no rash or vesicles.  There is no acute change in her vison.  There is no focal neurologic weakness at this time.  There is no sore throat.  There is no fevers, chills, no bad teeth, but no sinus congestion.  She has been having some runny nose but no sore throat.  She has not tried anything for her symptoms since they started.     Past Medical History Past Medical History:  Diagnosis Date   Anxiety    Coronary artery disease    Diabetes mellitus without complication (HCC)    GERD (gastroesophageal reflux disease)    Hypertension    Renal disorder    stage 3     Allergies Allergies  Allergen Reactions   Other Other (See Comments)    Cramps to all Cholesterol medications   Statins Other (See Comments)    "cramps"  UNKNOWN   Atorvastatin Other (See Comments)    Muscle cramps  UNKNOWN     Medications  Current Outpatient Medications:    acetaminophen (TYLENOL) 500 MG tablet, Take 500 mg by mouth every 8 (eight) hours as needed for mild pain or headache., Disp: , Rfl:    amLODipine (NORVASC) 2.5 MG tablet, Take 1 tablet (2.5 mg total) by mouth daily., Disp: 30 tablet, Rfl: 11   Biotin 5000 MCG TABS, Take 1 tablet by mouth daily., Disp: 90 tablet, Rfl: 3   Blood Glucose Monitoring Suppl (FREESTYLE LITE) DEVI, , Disp: , Rfl:    Cholecalciferol (VITAMIN D3) 1.25 MG (50000 UT) TABS, Take 1 tablet by mouth daily., Disp: , Rfl:    cloNIDine (CATAPRES) 0.1 MG tablet, Take  one tablet (0.1 mg) by mouth as needed for BP on top greater than 165 up to one time a day., Disp: 30 tablet, Rfl: 3   diclofenac Sodium (VOLTAREN) 1 % GEL, Apply topically., Disp: , Rfl:    Evolocumab (REPATHA SURECLICK) 140 MG/ML SOAJ, INJECT 140 MG INTO THE SKIN, IN THE ABDOMEN, THIGH, OR OUTER AREA OF UPPER ARM EVERY 14 DAYS, Disp: 2 mL, Rfl: 10   fluticasone (FLONASE) 50 MCG/ACT nasal spray, USE 1 SPRAY IN EACH NOSTRIL TWICE DAILY *SHAKE GENTLY*, Disp: 48 g, Rfl: 10   furosemide (LASIX) 20 MG tablet, TAKE 1/2 TABLET = 10 MG BY MOUTH ONCE DAILY, Disp: 45 tablet, Rfl: 2   glucose blood (FREESTYLE LITE) test strip, , Disp: , Rfl:    ibuprofen (ADVIL) 200 MG tablet, Take 200 mg by mouth as needed for mild pain or moderate pain., Disp: , Rfl:    magnesium oxide (MAG-OX) 400 (240 Mg) MG tablet, Take 200 mg by mouth daily., Disp: , Rfl:    MEGARED OMEGA-3 KRILL OIL PO, Take 1 tablet by mouth daily., Disp: , Rfl:    metFORMIN (GLUCOPHAGE) 500 MG tablet, TAKE 1 TABLET BY MOUTH TWICE DAILY *TAKE WITH FOOD*,  Disp: 180 tablet, Rfl: 2   metoprolol succinate (TOPROL-XL) 25 MG 24 hr tablet, Take 1 tablet (25 mg total) by mouth daily., Disp: 90 tablet, Rfl: 2   Multiple Vitamins-Minerals (CENTRUM SILVER ADULT 50+) TABS, Take 1 tablet by mouth daily., Disp: 90 tablet, Rfl: 3   pantoprazole (PROTONIX) 40 MG tablet, TAKE 1 TABLET BY MOUTH ONCE DAILY *DO NOT CRUSH OR CHEW*, Disp: 90 tablet, Rfl: 2   Polyethyl Glycol-Propyl Glycol (SYSTANE) 0.4-0.3 % SOLN, Place 1 drop into both eyes 2 (two) times daily., Disp: , Rfl:    sodium chloride (OCEAN) 0.65 % SOLN nasal spray, Place 1 spray into both nostrils as needed for congestion., Disp: , Rfl:    Review of Systems Review of Systems  Constitutional:  Negative for chills and fever.  HENT:  Positive for tinnitus. Negative for congestion, ear pain, hearing loss, sinus pain and sore throat.   Eyes:  Negative for blurred vision and double vision.  Respiratory:  Negative  for cough and shortness of breath.   Gastrointestinal:  Negative for abdominal pain, constipation, diarrhea, nausea and vomiting.  Skin:  Negative for itching and rash.  Neurological:  Negative for dizziness, focal weakness, weakness and headaches.       Objective:    Vitals BP 126/80 (BP Location: Left Arm, Patient Position: Sitting, Cuff Size: Normal)   Pulse 82   Temp 98 F (36.7 C)   Resp 18   Ht 5\' 3"  (1.6 m)   Wt 153 lb 6 oz (69.6 kg)   SpO2 97%   BMI 27.17 kg/m    Physical Examination Physical Exam Constitutional:      Appearance: Normal appearance. She is not ill-appearing.  HENT:     Head: Normocephalic and atraumatic.     Right Ear: Tympanic membrane, ear canal and external ear normal.     Left Ear: Tympanic membrane, ear canal and external ear normal.     Nose: Nose normal. No congestion or rhinorrhea.     Mouth/Throat:     Mouth: Mucous membranes are moist.     Pharynx: Oropharynx is clear. No oropharyngeal exudate or posterior oropharyngeal erythema.  Eyes:     General: No scleral icterus.    Conjunctiva/sclera: Conjunctivae normal.     Pupils: Pupils are equal, round, and reactive to light.  Neck:     Vascular: No carotid bruit.     Comments: She has a single anterior left sided upper cervical lymph node that is enlarged.  There is pain on palpation.  There is no increased warmth.  This is freely mobile but it is greater then 1 cm in diameter. Cardiovascular:     Rate and Rhythm: Normal rate and regular rhythm.     Pulses: Normal pulses.     Heart sounds: No murmur heard.    No friction rub. No gallop.  Pulmonary:     Effort: Pulmonary effort is normal. No respiratory distress.     Breath sounds: No wheezing, rhonchi or rales.  Abdominal:     General: Bowel sounds are normal. There is no distension.     Palpations: Abdomen is soft.     Tenderness: There is no abdominal tenderness.  Musculoskeletal:     Cervical back: Normal range of motion and neck  supple. No tenderness.     Right lower leg: No edema.     Left lower leg: No edema.  Skin:    General: Skin is warm and dry.     Findings:  No rash.  Neurological:     Mental Status: She is alert.        Assessment & Plan:   Lymphadenopathy She has a single right sided cervical lymph node where I am going to get a CBC.  We will start her on amoxicillin and if it does not improve in the next 2-3 weeks, she is to return to see Dr. Nelson Chimes.    No follow-ups on file.   Crist Fat, MD

## 2023-09-06 NOTE — Assessment & Plan Note (Signed)
She has a single right sided cervical lymph node where I am going to get a CBC.  We will start her on amoxicillin and if it does not improve in the next 2-3 weeks, she is to return to see Dr. Nelson Chimes.

## 2023-09-07 LAB — CBC WITH DIFFERENTIAL/PLATELET
Basophils Absolute: 0.1 10*3/uL (ref 0.0–0.2)
Basos: 0 %
EOS (ABSOLUTE): 0.3 10*3/uL (ref 0.0–0.4)
Eos: 2 %
Hematocrit: 40 % (ref 34.0–46.6)
Hemoglobin: 12.8 g/dL (ref 11.1–15.9)
Immature Grans (Abs): 0.1 10*3/uL (ref 0.0–0.1)
Immature Granulocytes: 1 %
Lymphocytes Absolute: 4.1 10*3/uL — ABNORMAL HIGH (ref 0.7–3.1)
Lymphs: 31 %
MCH: 31.4 pg (ref 26.6–33.0)
MCHC: 32 g/dL (ref 31.5–35.7)
MCV: 98 fL — ABNORMAL HIGH (ref 79–97)
Monocytes Absolute: 1.1 10*3/uL — ABNORMAL HIGH (ref 0.1–0.9)
Monocytes: 8 %
Neutrophils Absolute: 7.8 10*3/uL — ABNORMAL HIGH (ref 1.4–7.0)
Neutrophils: 58 %
Platelets: 302 10*3/uL (ref 150–450)
RBC: 4.07 x10E6/uL (ref 3.77–5.28)
RDW: 12.3 % (ref 11.7–15.4)
WBC: 13.3 10*3/uL — ABNORMAL HIGH (ref 3.4–10.8)

## 2023-09-10 ENCOUNTER — Encounter (INDEPENDENT_AMBULATORY_CARE_PROVIDER_SITE_OTHER): Payer: Medicare Other | Admitting: Ophthalmology

## 2023-09-10 DIAGNOSIS — H34832 Tributary (branch) retinal vein occlusion, left eye, with macular edema: Secondary | ICD-10-CM

## 2023-09-10 DIAGNOSIS — I10 Essential (primary) hypertension: Secondary | ICD-10-CM

## 2023-09-10 DIAGNOSIS — H35033 Hypertensive retinopathy, bilateral: Secondary | ICD-10-CM

## 2023-09-10 DIAGNOSIS — Z961 Presence of intraocular lens: Secondary | ICD-10-CM

## 2023-09-10 DIAGNOSIS — H04123 Dry eye syndrome of bilateral lacrimal glands: Secondary | ICD-10-CM

## 2023-09-10 DIAGNOSIS — H35352 Cystoid macular degeneration, left eye: Secondary | ICD-10-CM

## 2023-09-10 DIAGNOSIS — H35373 Puckering of macula, bilateral: Secondary | ICD-10-CM

## 2023-09-13 NOTE — Progress Notes (Signed)
[facility] - Clinic Note  09/14/2023     CHIEF COMPLAINT Patient presents for Retina Follow Up  HISTORY OF PRESENT ILLNESS: Nancy Webster is a 89 y.o. female who presents to the clinic today for:   HPI     Retina Follow Up   In both eyes.  This started 8 weeks ago.  Duration of 8 weeks.  Since onset it is stable.  I, the attending physician,  performed the HPI with the patient and updated documentation appropriately.        Comments   8 week retina follow up ERM OU pt is reporting no vision changes noticed she denies any flashes or floaters       Last edited by Rennis Chris, MD on 09/14/2023 12:40 PM.    Pt states her vision is blurrier than normal, she states her right eye is itching a lot, she uses Systane BID, pt states she missed her appt on Monday due to being sick, she has been on penicillin for 8 days, she still has 2 more days of medication  Referring physician: Maris Berger, MD [address] [place],  [place] [address]  HISTORICAL INFORMATION:  Selected notes from the MEDICAL RECORD NUMBER Referred by Dr. [name] for eval of ERM OU   CURRENT MEDICATIONS: Current Outpatient Medications (Ophthalmic Drugs)  Medication Sig   Polyethyl Glycol-Propyl Glycol (SYSTANE) 0.4-0.3 % SOLN Place 1 drop into both eyes 2 (two) times daily.   No current facility-administered medications for this visit. (Ophthalmic Drugs)   Current Outpatient Medications (Other)  Medication Sig   acetaminophen (TYLENOL) 500 MG tablet Take 500 mg by mouth every 8 (eight) hours as needed for mild pain or headache.   amLODipine (NORVASC) 2.5 MG tablet Take 1 tablet (2.5 mg total) by mouth daily.   amoxicillin (AMOXIL) 500 MG capsule Take 1 capsule (500 mg total) by mouth 2 (two) times daily for 10 days.   Biotin 5000 MCG TABS Take 1 tablet by mouth daily.   Blood Glucose Monitoring Suppl (FREESTYLE LITE) DEVI    Cholecalciferol (VITAMIN D3) 1.25 MG (50000 UT) TABS Take  1 tablet by mouth daily.   cloNIDine (CATAPRES) 0.1 MG tablet Take one tablet (0.1 mg) by mouth as needed for BP on top greater than 165 up to one time a day.   diclofenac Sodium (VOLTAREN) 1 % GEL Apply topically.   Evolocumab (REPATHA SURECLICK) 140 MG/ML SOAJ INJECT 140 MG INTO THE SKIN, IN THE ABDOMEN, THIGH, OR OUTER AREA OF UPPER ARM EVERY 14 DAYS   fluticasone (FLONASE) 50 MCG/ACT nasal spray USE 1 SPRAY IN EACH NOSTRIL TWICE DAILY *SHAKE GENTLY*   furosemide (LASIX) 20 MG tablet TAKE 1/2 TABLET = 10 MG BY MOUTH ONCE DAILY   glucose blood (FREESTYLE LITE) test strip    ibuprofen (ADVIL) 200 MG tablet Take 200 mg by mouth as needed for mild pain or moderate pain.   magnesium oxide (MAG-OX) 400 (240 Mg) MG tablet Take 200 mg by mouth daily.   MEGARED OMEGA-3 KRILL OIL PO Take 1 tablet by mouth daily.   metFORMIN (GLUCOPHAGE) 500 MG tablet TAKE 1 TABLET BY MOUTH TWICE DAILY *TAKE WITH FOOD*   metoprolol succinate (TOPROL-XL) 25 MG 24 hr tablet Take 1 tablet (25 mg total) by mouth daily.   Multiple Vitamins-Minerals (CENTRUM SILVER ADULT 50+) TABS Take 1 tablet by mouth daily.   pantoprazole (PROTONIX) 40 MG tablet TAKE 1 TABLET BY MOUTH ONCE DAILY *DO NOT  CRUSH OR CHEW*   sodium chloride (OCEAN) 0.65 % SOLN nasal spray Place 1 spray into both nostrils as needed for congestion.   No current facility-administered medications for this visit. (Other)   REVIEW OF SYSTEMS: ROS   Positive for: Endocrine, Cardiovascular, Eyes Negative for: Constitutional, Gastrointestinal, Neurological, Skin, Genitourinary, Musculoskeletal, HENT, Respiratory, Psychiatric, Allergic/Imm, Heme/Lymph Last edited by Etheleen Mayhew, COT on 09/14/2023  9:29 AM.     ALLERGIES Allergies  Allergen Reactions   Other Other (See Comments)    Cramps to all Cholesterol medications   Statins Other (See Comments)    "cramps"  UNKNOWN   Atorvastatin Other (See Comments)    Muscle cramps  UNKNOWN   PAST  MEDICAL HISTORY Past Medical History:  Diagnosis Date   Anxiety    Coronary artery disease    Diabetes mellitus without complication (HCC)    GERD (gastroesophageal reflux disease)    Hypertension    Renal disorder    stage 3   Past Surgical History:  Procedure Laterality Date   CATARACT EXTRACTION Bilateral    [place]   CHOLECYSTECTOMY N/A 03/02/2018   Procedure: LAPAROSCOPIC CHOLECYSTECTOMY;  Surgeon: Gaynelle Adu, MD;  Location: [facility] OR;  Service: General;  Laterality: N/A;   ERCP N/A 06/26/2019   Procedure: ENDOSCOPIC RETROGRADE CHOLANGIOPANCREATOGRAPHY (ERCP);  Surgeon: Kerin Salen, MD;  Location: [facility] ENDOSCOPY;  Service: Gastroenterology;  Laterality: N/A;   SPHINCTEROTOMY  06/26/2019   Procedure: SPHINCTEROTOMY;  Surgeon: Kerin Salen, MD;  Location: [facility] ENDOSCOPY;  Service: Gastroenterology;;   FAMILY HISTORY Family History  Problem Relation Age of Onset   Heart failure Mother    Diabetes Daughter    SOCIAL HISTORY Social History   Tobacco Use   Smoking status: Never   Smokeless tobacco: Never  Vaping Use   Vaping status: Never Used  Substance Use Topics   Alcohol use: Not Currently   Drug use: Never       OPHTHALMIC EXAM: Base Eye Exam     Visual Acuity (Snellen - Linear)       Right Left   Dist [place] 20/50 20/200 +1   Dist ph [place] NI NI         Tonometry (Tonopen, 9:38 AM)       Right Left   Pressure 12 14         Pupils       Pupils Dark Light Shape React APD   Right PERRL 3 2 Round Brisk None   Left PERRL 3 2 Round Brisk None         Visual Fields       Left Right    Full Full         Extraocular Movement       Right Left    Full, Ortho Full, Ortho         Neuro/Psych     Oriented x3: Yes   Mood/Affect: Normal         Dilation     Both eyes: 2.5% Phenylephrine @ 9:37 AM           Slit Lamp and Fundus Exam     Slit Lamp Exam       Right Left   Lids/Lashes Dermatochalasis - upper lid, mild MGD, erythemia UL  and LL Dermatochalasis - upper lid, mild MGD, erythema UL and LL   Conjunctiva/Sclera White and quiet White and quiet   Cornea well healed cataract wound, mild arcus, 1-2+ Punctate epithelial erosions, tear film debris  1+ fine PEE, well healed cataract wound, tear film debris, fine endo pigment   Anterior Chamber Deep and clear deep and clear   Iris Round and moderately dilated Round and poorly dilated 3.25, focal atrophy and TID at 0200-0300   Lens PC IOL in good position with open PC PC IOL in good position with open PC   Anterior Vitreous Vitreous syneresis, Posterior vitreous detachment, vitreous condensations Vitreous syneresis, Posterior vitreous detachment         Fundus Exam       Right Left   Disc mild Pallor, Sharp rim, PPA mild Pallor, mild temporal PPA, mild cupping, sharp rim, tilted   C/D Ratio 0.5 0.65   Macula Flat, Blunted foveal reflex, mild ERM, RPE mottling, No heme or edema Flat, Blunted foveal reflex, ERM with +cystic changes, persistent central SRF, no heme   Vessels attenuated, Tortuous attenuated, Tortuous   Periphery Attached, blonde fundus Attached, blonde fundus, persistent punctate MA/IRH/SRH superior midzone along superior venule -- improved-- BRVO, no edema, reticular degeneration, No heme           IMAGING AND PROCEDURES  Imaging and Procedures for 09/14/2023  OCT, Retina - OU - Both Eyes       Right Eye Quality was good. Central Foveal Thickness: 399. Progression has been stable. Findings include no IRF, no SRF, abnormal foveal contour, retinal drusen , epiretinal membrane, macular pucker, vitreomacular adhesion (ERM with Blunted foveal contour and central thickening, partial PVD -- stable).   Left Eye Quality was good. Central Foveal Thickness: 450. Progression has been stable. Findings include abnormal foveal contour, retinal drusen , epiretinal membrane, intraretinal fluid, subretinal fluid, vitreomacular adhesion (Persistent central IRF/SRF,  persistent ERM, ellipsoid disruption under ERM).   Notes *Images captured and stored on drive  Diagnosis / Impression:  ERM OU  OD: ERM with Blunted foveal contour and central thickening, partial PVD -- stable OS: Persistent central IRF/SRF,, persistent ERM, ellipsoid disruption under ERM  Clinical management:  See below  Abbreviations: NFP - Normal foveal profile. CME - cystoid macular edema. PED - pigment epithelial detachment. IRF - intraretinal fluid. SRF - subretinal fluid. EZ - ellipsoid zone. ERM - epiretinal membrane. ORA - outer retinal atrophy. ORT - outer retinal tubulation. SRHM - subretinal hyper-reflective material. IRHM - intraretinal hyper-reflective material            ASSESSMENT/PLAN:    ICD-10-CM   1. Epiretinal membrane (ERM) of both eyes  H35.373 OCT, Retina - OU - Both Eyes    2. Cystoid macular edema of left eye  H35.352 CANCELED: Intravitreal Injection, Pharmacologic Agent - OS - Left Eye    3. Essential hypertension  I10     4. Hypertensive retinopathy of both eyes  H35.033     5. Branch retinal vein occlusion of left eye with macular edema  H34.8320     6. Pseudophakia, both eyes  Z96.1     7. Dry eyes  H04.123      1,2. Epiretinal membrane, both eyes  - +ERM w/ blunting of foveal contour OU -- OS with +IRF / persistent cystic changes - FA (11.01.22) shows mild perifoveal staining / leakage OS suggestive of CME component - repeat FA (06.06.23) shows OD: Mild, late peripapillary staining; OS: Delayed venous return of superior venule, punctate perivascular blockage along superior venule -- superior BRVO outside of macula  - started on PF and Prolensa QID OS on 11.1.22 - s/p STK OS #1 (12.20.22) - BCVA OD: 20/50; OS: 20/200 --  both stable - OCT shows OD: ERM with Blunted foveal contour and central thickening, partial PVD -- stable; OS: Persistent central IRF/SRF, persistent ERM, ellipsoid disruption in ERM - d/c'd PF and Prolensa BID OS due to  intolerance - STK consent signed 12.20.22 - we have repeated the discussion on ERM surgery and prolonged recovery - pt wishes to hold off on ERM surgery  3,4. Hypertensive retinopathy OU  - BP in office 6.6.23 was 171/73 - discussed importance of tight BP control and relationship to vision and BRVO OS - continue to monitor  5. BRVO OS - s/p IVA OS #1 (08.10.23), #2 (09.07.23), #3 (10.06.23), #4 (11.06.23), #5 (12.11.23) -- IVA resistance - s/p IVE OS #1 (01.22.24), #2 (02.26.24), #3 (04.01.24), #4 (05.06.24), #5 (06.03.24), #6 (07.29.24), #7 (09.16.24) - persistent retinal hemorrhages along superior venule, superior to disc -- improved today - FA 6.6.23 shows delayed venous return through superior venule suggestive of BRVO - pt also reports history of recent TIA-like episode (telephone note on 4.12.23) -- pt experienced transient visual field loss and blurred vision that resolved over several minutes  - BP at previous visit (05/05/22) -- 171/73 - all of these factors raise concern / increased risk of possible cardiovascular compromise, I.e. CVA, MI, etc. - discussed findings and case with pt's PCP -- Dr. 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 Pseudophakia OU  - s/p CE/IOL (Dr. Lisette Abu)  - IOLs in good position, doing well  - continue to monitor  7. Dry eyes OU - recommend artificial tears and lubricating ointment as needed - may do better with preservative free drops, given history with Pred Forte  and prolensa  Ophthalmic Meds Ordered this visit:  No orders of the defined types were placed in this encounter.    Return for f/u 7-8 weeks, BRVO OS, DFE, OCT.  There are no Patient Instructions on file for this visit.  This document serves as a record of services personally performed by Karie Chimera, MD, PhD. It was created on their behalf by Berlin Hun COT, an ophthalmic technician. The creation of this record is the provider's dictation and/or activities during the visit.    Electronically signed by: Berlin Hun COT 11.14.24 1:44 AM.  This document serves as a record of services personally performed by Karie Chimera, MD, PhD. It was created on their behalf by Glee Arvin. Manson Passey, OA an ophthalmic technician. The creation of this record is the provider's dictation and/or activities during the visit.    Electronically signed by: Glee Arvin. Manson Passey, OA 09/16/23 1:44 AM  Karie Chimera, M.D., Ph.D. Diseases & Surgery of the Retina and Vitreous [facility]  I have reviewed the above documentation for accuracy and completeness, and I agree with the above. Karie Chimera, M.D., Ph.D. 09/16/23 2:03 AM   Abbreviations: M myopia (nearsighted); A astigmatism; H hyperopia (farsighted); P presbyopia; Mrx spectacle prescription;  CTL contact lenses; OD right eye; OS left eye; OU both eyes  XT exotropia; ET  esotropia; PEK punctate epithelial keratitis; PEE punctate epithelial erosions; DES dry eye syndrome; MGD meibomian gland dysfunction; ATs artificial tears; PFAT's preservative free artificial tears; NSC nuclear sclerotic cataract; PSC posterior subcapsular cataract; ERM epi-retinal membrane; PVD posterior vitreous detachment; RD retinal detachment; DM diabetes mellitus; DR diabetic retinopathy; NPDR non-proliferative diabetic retinopathy; PDR proliferative diabetic retinopathy; CSME clinically significant macular edema; DME diabetic macular edema; dbh dot  blot hemorrhages; CWS cotton wool spot; POAG primary open angle glaucoma; C/D cup-to-disc ratio; HVF humphrey visual field; GVF goldmann visual field; OCT optical coherence tomography; IOP intraocular pressure; BRVO Branch retinal vein occlusion; CRVO central retinal vein occlusion; CRAO central retinal artery occlusion; BRAO branch retinal artery occlusion; RT retinal tear; SB scleral buckle; PPV pars plana vitrectomy; VH Vitreous hemorrhage; PRP panretinal laser photocoagulation; IVK intravitreal kenalog; VMT vitreomacular traction; MH Macular hole;  NVD neovascularization of the disc; NVE neovascularization elsewhere; AREDS age related eye disease study; ARMD age related macular degeneration; POAG primary open angle glaucoma; EBMD epithelial/anterior basement membrane dystrophy; ACIOL anterior chamber intraocular lens; IOL intraocular lens; PCIOL posterior chamber intraocular lens; Phaco/IOL phacoemulsification with intraocular lens placement; PRK photorefractive keratectomy; LASIK laser assisted in situ keratomileusis; HTN hypertension; DM diabetes mellitus; COPD chronic obstructive pulmonary disease

## 2023-09-14 ENCOUNTER — Encounter (INDEPENDENT_AMBULATORY_CARE_PROVIDER_SITE_OTHER): Payer: Self-pay | Admitting: Ophthalmology

## 2023-09-14 ENCOUNTER — Telehealth: Payer: Self-pay | Admitting: Internal Medicine

## 2023-09-14 ENCOUNTER — Ambulatory Visit (INDEPENDENT_AMBULATORY_CARE_PROVIDER_SITE_OTHER): Payer: Medicare Other | Admitting: Ophthalmology

## 2023-09-14 DIAGNOSIS — H04123 Dry eye syndrome of bilateral lacrimal glands: Secondary | ICD-10-CM | POA: Diagnosis not present

## 2023-09-14 DIAGNOSIS — H35033 Hypertensive retinopathy, bilateral: Secondary | ICD-10-CM | POA: Diagnosis not present

## 2023-09-14 DIAGNOSIS — H34832 Tributary (branch) retinal vein occlusion, left eye, with macular edema: Secondary | ICD-10-CM | POA: Diagnosis not present

## 2023-09-14 DIAGNOSIS — I10 Essential (primary) hypertension: Secondary | ICD-10-CM | POA: Diagnosis not present

## 2023-09-14 DIAGNOSIS — H35373 Puckering of macula, bilateral: Secondary | ICD-10-CM | POA: Diagnosis not present

## 2023-09-14 DIAGNOSIS — Z961 Presence of intraocular lens: Secondary | ICD-10-CM | POA: Diagnosis not present

## 2023-09-14 DIAGNOSIS — H35352 Cystoid macular degeneration, left eye: Secondary | ICD-10-CM | POA: Diagnosis not present

## 2023-09-14 DIAGNOSIS — E11311 Type 2 diabetes mellitus with unspecified diabetic retinopathy with macular edema: Secondary | ICD-10-CM

## 2023-09-14 NOTE — Telephone Encounter (Signed)
Pt is requesting a callback regarding her stating the office Dr. Tenny Craw referred her to stated they need a call from her needing more information. She stated she was referred to give to get a second opinion. Please advise

## 2023-09-14 NOTE — Telephone Encounter (Signed)
Left voicemail to return call to office.

## 2023-09-18 ENCOUNTER — Ambulatory Visit: Payer: Medicare Other | Admitting: Internal Medicine

## 2023-09-18 ENCOUNTER — Encounter: Payer: Self-pay | Admitting: Internal Medicine

## 2023-09-18 VITALS — BP 124/68 | HR 81 | Temp 97.3°F | Resp 18 | Ht 63.0 in

## 2023-09-18 DIAGNOSIS — R590 Localized enlarged lymph nodes: Secondary | ICD-10-CM | POA: Diagnosis not present

## 2023-09-18 DIAGNOSIS — Q385 Congenital malformations of palate, not elsewhere classified: Secondary | ICD-10-CM | POA: Insufficient documentation

## 2023-09-18 NOTE — Telephone Encounter (Signed)
Pt says she called Dr Fawn Kirk per Dr Tenny Craw advice and they asked her to have Dr Tenny Craw call Dr Luciana Axe about the referral.. I will send to Dr Tenny Craw.   [phone]

## 2023-09-18 NOTE — Telephone Encounter (Signed)
Patient is calling back to make sure Dr. Tenny Craw called the eye dr, she referred the patient to. The office is asking for dr Tenny Craw to call their office. Please advise

## 2023-09-18 NOTE — Progress Notes (Signed)
   Acute Office Visit  Subjective:     Patient ID: Nancy Webster, female    DOB: 02-Aug-1928, 89 y.o.   MRN: [id number]  Chief Complaint  Patient presents with   Lump under chin    Office visit    HPI Patient is in today for right submandibular lymphadenopathy. She has pain in her right side jaw and face. She was given antibiotic amoxil. Her WBC shows WBC of 14. She has finished antibiotic and lymphadenopathy has almost resolved.  She also saw dentist who want her to see ENT as her mouth roof has some problem.   Review of Systems  Constitutional: Negative.   HENT: Negative.    Respiratory: Negative.    Cardiovascular: Negative.   Gastrointestinal: Negative.   Neurological: Negative.         Objective:    BP 124/68 (BP Location: Left Arm, Patient Position: Sitting, Cuff Size: Normal)   Pulse 81   Temp (!) 97.3 F (36.3 C)   Resp 18   Ht 5\' 3"  (1.6 m)   SpO2 95%   BMI 27.17 kg/m    Physical Exam Constitutional:      Appearance: Normal appearance.  Neurological:     Mental Status: She is alert.     No results found for any visits on 09/18/23.      Assessment & Plan:   Problem List Items Addressed This Visit       Immune and Lymphatic   LAD (lymphadenopathy) of right cervical region    Her right submandibular lymphadenopathy is almost resolved.         Other   Palate abnormality - Primary    Her dentist wants her to see seen by ENT for discoloration of right side of palate.        No orders of the defined types were placed in this encounter.   No follow-ups on file.  Eloisa Northern, MD

## 2023-09-18 NOTE — Assessment & Plan Note (Signed)
Her dentist wants her to see seen by ENT for discoloration of right side of palate.

## 2023-09-18 NOTE — Assessment & Plan Note (Signed)
Her right submandibular lymphadenopathy is almost resolved.

## 2023-09-25 NOTE — Telephone Encounter (Signed)
Nancy Riffle, MD  You1 hour ago (8:08 AM)    This is [name]  REfer to G [facility] for second opinion   Referral placed.   Pt advised.

## 2023-10-02 NOTE — Progress Notes (Signed)
Her WBC was not extremely elevated in regards to her lymph node swelling. She probably does not have leukemia or lymphoma   Patient is aware of labs

## 2023-10-09 ENCOUNTER — Ambulatory Visit: Payer: Medicare Other | Admitting: Internal Medicine

## 2023-10-09 ENCOUNTER — Encounter: Payer: Self-pay | Admitting: Internal Medicine

## 2023-10-09 VITALS — BP 124/80 | HR 78 | Temp 97.8°F | Resp 18 | Ht 63.0 in | Wt 150.0 lb

## 2023-10-09 DIAGNOSIS — J0110 Acute frontal sinusitis, unspecified: Secondary | ICD-10-CM | POA: Diagnosis not present

## 2023-10-09 DIAGNOSIS — N1831 Chronic kidney disease, stage 3a: Secondary | ICD-10-CM

## 2023-10-09 DIAGNOSIS — E11311 Type 2 diabetes mellitus with unspecified diabetic retinopathy with macular edema: Secondary | ICD-10-CM | POA: Diagnosis not present

## 2023-10-09 DIAGNOSIS — I10 Essential (primary) hypertension: Secondary | ICD-10-CM | POA: Diagnosis not present

## 2023-10-09 DIAGNOSIS — E782 Mixed hyperlipidemia: Secondary | ICD-10-CM | POA: Diagnosis not present

## 2023-10-09 MED ORDER — AMOXICILLIN-POT CLAVULANATE 875-125 MG PO TABS: 1.0000 | ORAL_TABLET | Freq: Two times a day (BID) | ORAL | 0 refills | Status: DC

## 2023-10-09 NOTE — Assessment & Plan Note (Signed)
Her blood sugar is stable.  She will continue to follow with eye doctor.

## 2023-10-09 NOTE — Progress Notes (Addendum)
Office Visit  Subjective   Patient ID: Nancy Webster   DOB: August 18, 1928   Age: 89 y.o.   MRN: [id number]   Chief Complaint Chief Complaint  Patient presents with   Follow-up    3 month follow up     History of Present Illness 89 years old female who is here with follow up.  She has sinusitis and I have given her some antibiotics she says that she is feeling better but she still has a lot of pressure on her face and feels lymph node in the right side but that barely palpable now.  She feels that she is still coughing up green phlegm.  She has a discoloration of her hard palate and her dentist suggested to see ENT.  She has an appointment to see ear nose throat doctor in January 25.    I have reviewed labs with her that her kidney function is much better with GFR of 54.  She has diabetes mellitus.  Her sugar been good.  Her HbA1c was 7.4% in July 24. She saw eye doctor and she has retinopathy in both eyes and left eye macular edema. She is getting injection in her eyes and one she got yesterday. No hypoglycemia.    She has dyslipidemia and take repatha as she is intolerant to statin. Her LDL in September was target controlled. She take repatha injection twice a month.  She also has hypertension and take  amlodipine 2.5 mg and metoprolol 25 mg daily, her cardiologist gave her clonidine to take only when her SBP is high but she says that she ahs not used it.   She has CKD 3a and that got better. She take lasix 20 mg 1/2 tablet PO daily as needed.     Past Medical History Past Medical History:  Diagnosis Date   Anxiety    Coronary artery disease    Diabetes mellitus without complication (HCC)    GERD (gastroesophageal reflux disease)    Hypertension    Renal disorder    stage 3     Allergies Allergies  Allergen Reactions   Other Other (See Comments)    Cramps to all Cholesterol medications   Statins Other (See Comments)    "cramps"  UNKNOWN   Atorvastatin Other (See Comments)     Muscle cramps  UNKNOWN     Review of Systems Review of Systems  Constitutional: Negative.   HENT:  Positive for sinus pain.   Respiratory: Negative.    Cardiovascular: Negative.   Gastrointestinal: Negative.   Neurological: Negative.        Objective:    Vitals BP 124/80 (BP Location: Left Arm, Patient Position: Sitting, Cuff Size: Normal)   Pulse 78   Temp 97.8 F (36.6 C)   Resp 18   Ht 5\' 3"  (1.6 m)   Wt 150 lb (68 kg)   SpO2 98%   BMI 26.57 kg/m    Physical Examination Physical Exam Constitutional:      Appearance: Normal appearance.  HENT:     Head: Normocephalic and atraumatic.  Cardiovascular:     Rate and Rhythm: Normal rate and regular rhythm.     Heart sounds: Normal heart sounds.  Pulmonary:     Effort: Pulmonary effort is normal.     Breath sounds: Normal breath sounds.  Abdominal:     Palpations: Abdomen is soft.  Neurological:     General: No focal deficit present.     Mental Status: She is  alert and oriented to person, place, and time.        Assessment & Plan:   Acute frontal sinusitis I will send her augmentin for 10 days again. She is going to see ENT for discoloration of hard palate that dentist suggested to see ENT.  Essential hypertension Controlled.  Diabetic retinopathy associated with type 2 diabetes mellitus (HCC) Her blood sugar is stable.  She will continue to follow with eye doctor.  Chronic kidney disease, stage 3a (HCC) Her renal function is stable now.  Hyperlipidemia I have reviewed her lipid panel with her and that is stable.    Return in about 2 months (around 12/10/2023).   Eloisa Northern, MD

## 2023-10-09 NOTE — Assessment & Plan Note (Signed)
Her renal function is stable now.

## 2023-10-09 NOTE — Assessment & Plan Note (Signed)
I have reviewed her lipid panel with her and that is stable.

## 2023-10-09 NOTE — Assessment & Plan Note (Signed)
I will send her augmentin for 10 days again. She is going to see ENT for discoloration of hard palate that dentist suggested to see ENT.

## 2023-10-09 NOTE — Assessment & Plan Note (Signed)
Controlled.  

## 2023-10-31 NOTE — Progress Notes (Signed)
 [facility] - Clinic Note  11/05/2023     CHIEF COMPLAINT Patient presents for Retina Follow Up  HISTORY OF PRESENT ILLNESS: Nancy Webster is a 89 y.o. female who presents to the clinic today for:   HPI     Retina Follow Up   Patient presents with  CRVO/BRVO (ERM).  In left eye.  Severity is moderate.  Duration of 7 weeks.  Since onset it is stable.  I, the attending physician,  performed the HPI with the patient and updated documentation appropriately.        Comments   Patient states vision the same OU.      Last edited by Valdemar Rogue, MD on 11/05/2023 12:13 PM.    Pt states she has problems with her vision occasionally, she is using a magnifying glass which has helped her read more  Referring physician: Leslee Reusing, MD [address] [place],  [place] [address]  HISTORICAL INFORMATION:  Selected notes from the MEDICAL RECORD NUMBER Referred by Dr. McCuen for eval of ERM OU   CURRENT MEDICATIONS: Current Outpatient Medications (Ophthalmic Drugs)  Medication Sig   Polyethyl Glycol-Propyl Glycol (SYSTANE) 0.4-0.3 % SOLN Place 1 drop into both eyes 2 (two) times daily.   No current facility-administered medications for this visit. (Ophthalmic Drugs)   Current Outpatient Medications (Other)  Medication Sig   acetaminophen  (TYLENOL ) 500 MG tablet Take 500 mg by mouth every 8 (eight) hours as needed for mild pain or headache.   amLODipine  (NORVASC ) 2.5 MG tablet Take 1 tablet (2.5 mg total) by mouth daily.   amoxicillin -clavulanate (AUGMENTIN ) 875-125 MG tablet Take 1 tablet by mouth 2 (two) times daily.   Biotin  5000 MCG TABS Take 1 tablet by mouth daily.   Blood Glucose Monitoring Suppl (FREESTYLE LITE) DEVI    Cholecalciferol (VITAMIN D3) 1.25 MG (50000 UT) TABS Take 1 tablet by mouth daily.   cloNIDine  (CATAPRES ) 0.1 MG tablet Take one tablet (0.1 mg) by mouth as needed for BP on top greater than 165 up to one time a day.   diclofenac Sodium  (VOLTAREN) 1 % GEL Apply topically.   Evolocumab  (REPATHA  SURECLICK) 140 MG/ML SOAJ INJECT 140 MG INTO THE SKIN, IN THE ABDOMEN, THIGH, OR OUTER AREA OF UPPER ARM EVERY 14 DAYS   fluticasone  (FLONASE ) 50 MCG/ACT nasal spray USE 1 SPRAY IN EACH NOSTRIL TWICE DAILY *SHAKE GENTLY*   furosemide  (LASIX ) 20 MG tablet TAKE 1/2 TABLET = 10 MG BY MOUTH ONCE DAILY   glucose blood (FREESTYLE LITE) test strip    ibuprofen (ADVIL) 200 MG tablet Take 200 mg by mouth as needed for mild pain or moderate pain.   magnesium oxide (MAG-OX) 400 (240 Mg) MG tablet Take 200 mg by mouth daily.   MEGARED OMEGA-3 KRILL OIL PO Take 1 tablet by mouth daily.   metFORMIN (GLUCOPHAGE) 500 MG tablet TAKE 1 TABLET BY MOUTH TWICE DAILY *TAKE WITH FOOD*   metoprolol  succinate (TOPROL -XL) 25 MG 24 hr tablet Take 1 tablet (25 mg total) by mouth daily.   Multiple Vitamins-Minerals (CENTRUM SILVER  ADULT 50+) TABS Take 1 tablet by mouth daily.   pantoprazole  (PROTONIX ) 40 MG tablet TAKE 1 TABLET BY MOUTH ONCE DAILY *DO NOT CRUSH OR CHEW*   sodium chloride  (OCEAN) 0.65 % SOLN nasal spray Place 1 spray into both nostrils as needed for congestion.   No current facility-administered medications for this visit. (Other)   REVIEW OF SYSTEMS: ROS   Positive for: Endocrine,  Cardiovascular, Eyes Negative for: Constitutional, Gastrointestinal, Neurological, Skin, Genitourinary, Musculoskeletal, HENT, Respiratory, Psychiatric, Allergic/Imm, Heme/Lymph Last edited by Verneda Howard D, COT on 11/05/2023  9:54 AM.      ALLERGIES Allergies  Allergen Reactions   Other Other (See Comments)    Cramps to all Cholesterol medications   Statins Other (See Comments)    cramps  UNKNOWN   Atorvastatin Other (See Comments)    Muscle cramps  UNKNOWN   PAST MEDICAL HISTORY Past Medical History:  Diagnosis Date   Anxiety    Coronary artery disease    Diabetes mellitus without complication (HCC)    GERD (gastroesophageal reflux disease)     Hypertension    Renal disorder    stage 3   Past Surgical History:  Procedure Laterality Date   CATARACT EXTRACTION Bilateral    [place]   CHOLECYSTECTOMY N/A 03/02/2018   Procedure: LAPAROSCOPIC CHOLECYSTECTOMY;  Surgeon: Tanda Locus, MD;  Location: [facility] OR;  Service: General;  Laterality: N/A;   ERCP N/A 06/26/2019   Procedure: ENDOSCOPIC RETROGRADE CHOLANGIOPANCREATOGRAPHY (ERCP);  Surgeon: Saintclair Jasper, MD;  Location: [facility] ENDOSCOPY;  Service: Gastroenterology;  Laterality: N/A;   SPHINCTEROTOMY  06/26/2019   Procedure: SPHINCTEROTOMY;  Surgeon: Saintclair Jasper, MD;  Location: [facility] ENDOSCOPY;  Service: Gastroenterology;;   FAMILY HISTORY Family History  Problem Relation Age of Onset   Heart failure Mother    Diabetes Daughter    SOCIAL HISTORY Social History   Tobacco Use   Smoking status: Never   Smokeless tobacco: Never  Vaping Use   Vaping status: Never Used  Substance Use Topics   Alcohol use: Not Currently   Drug use: Never       OPHTHALMIC EXAM: Base Eye Exam     Visual Acuity (Snellen - Linear)       Right Left   Dist [place] 20/40 +2 20/150 -2   Dist ph [place] 20/30 -2 NI         Tonometry (Tonopen, 10:02 AM)       Right Left   Pressure 12 14         Pupils       Dark Light Shape React APD   Right 3 2 Round Brisk None   Left 3 2 Round Brisk None         Visual Fields (Counting fingers)       Left Right    Full Full         Extraocular Movement       Right Left    Full, Ortho Full, Ortho         Neuro/Psych     Oriented x3: Yes   Mood/Affect: Normal         Dilation     Both eyes: 1.0% Mydriacyl, 2.5% Phenylephrine  @ 10:02 AM           Slit Lamp and Fundus Exam     Slit Lamp Exam       Right Left   Lids/Lashes Dermatochalasis - upper lid, mild MGD Dermatochalasis - upper lid, mild MGD, erythema UL and LL   Conjunctiva/Sclera White and quiet White and quiet   Cornea well healed cataract wound, mild arcus, 1+ Punctate  epithelial erosions, tear film debris, EBMD 2+ fine PEE, well healed cataract wound, tear film debris, fine endo pigment   Anterior Chamber Deep and clear deep and clear   Iris Round and poorly dilated to 4.22mm Irregular dilation, focal atrophy and TID from 0200-0300   Lens  PC IOL in good position with open PC PC IOL in good position with open PC   Anterior Vitreous Vitreous syneresis, Posterior vitreous detachment, vitreous condensations Vitreous syneresis, Posterior vitreous detachment         Fundus Exam       Right Left   Disc mild Pallor, Sharp rim, PPA mild Pallor, mild temporal PPA, mild cupping, sharp rim, tilted, fine vascular loops on ST rim   C/D Ratio 0.5 0.65   Macula Flat, Blunted foveal reflex, mild ERM, RPE mottling, No heme or edema Flat, Blunted foveal reflex, ERM with +cystic changes, persistent central SRF, no heme   Vessels attenuated, Tortuous attenuated, Tortuous   Periphery Attached, blonde fundus, No heme Attached, blonde fundus, persistent punctate MA/IRH superior midzone along superior venule -- improved -- BRVO, no edema, reticular degeneration, No heme           IMAGING AND PROCEDURES  Imaging and Procedures for 11/05/2023  OCT, Retina - OU - Both Eyes       Right Eye Quality was good. Central Foveal Thickness: 393. Progression has been stable. Findings include no IRF, no SRF, abnormal foveal contour, retinal drusen , epiretinal membrane, macular pucker, vitreomacular adhesion (ERM with Blunted foveal contour and central thickening, partial PVD -- stable).   Left Eye Quality was good. Central Foveal Thickness: 471. Progression has been stable. Findings include abnormal foveal contour, retinal drusen , epiretinal membrane, intraretinal fluid, subretinal fluid, vitreomacular adhesion (Persistent central IRF/SRF, persistent ERM, ellipsoid disruption under ERM).   Notes *Images captured and stored on drive  Diagnosis / Impression:  ERM OU  OD: ERM with  Blunted foveal contour and central thickening, partial PVD -- stable OS: Persistent central IRF/SRF,, persistent ERM, ellipsoid disruption under ERM  Clinical management:  See below  Abbreviations: NFP - Normal foveal profile. CME - cystoid macular edema. PED - pigment epithelial detachment. IRF - intraretinal fluid. SRF - subretinal fluid. EZ - ellipsoid zone. ERM - epiretinal membrane. ORA - outer retinal atrophy. ORT - outer retinal tubulation. SRHM - subretinal hyper-reflective material. IRHM - intraretinal hyper-reflective material            ASSESSMENT/PLAN:    ICD-10-CM   1. Epiretinal membrane (ERM) of both eyes  H35.373 OCT, Retina - OU - Both Eyes    2. Cystoid macular edema of left eye  H35.352     3. Essential hypertension  I10     4. Hypertensive retinopathy of both eyes  H35.033     5. Branch retinal vein occlusion of left eye with macular edema  H34.8320 OCT, Retina - OU - Both Eyes    6. Pseudophakia, both eyes  Z96.1     7. Dry eyes  H04.123      1,2. Epiretinal membrane, both eyes  - +ERM w/ blunting of foveal contour OU -- OS with +IRF / persistent cystic changes - FA (11.01.22) shows mild perifoveal staining / leakage OS suggestive of CME component - repeat FA (06.06.23) shows OD: Mild, late peripapillary staining; OS: Delayed venous return of superior venule, punctate perivascular blockage along superior venule -- superior BRVO outside of macula  - started on PF and Prolensa  QID OS on 11.1.22 - s/p STK OS #1 (12.20.22) - BCVA OD: 20/50; OS: 20/200 -- both stable - OCT shows OD: ERM with Blunted foveal contour and central thickening, partial PVD -- stable; OS: Persistent central IRF/SRF, persistent ERM, ellipsoid disruption in ERM - d/c'd PF and Prolensa  BID OS due to  intolerance - STK consent signed 12.20.22 - pt wishes to hold off on ERM surgery  3,4. Hypertensive retinopathy OU  - BP in office 6.6.23 was 171/73 - discussed importance of tight BP  control and relationship to vision and BRVO OS - continue to monitor  5. BRVO OS - s/p IVA OS #1 (08.10.23), #2 (09.07.23), #3 (10.06.23), #4 (11.06.23), #5 (12.11.23) -- IVA resistance - s/p IVE OS #1 (01.22.24), #2 (02.26.24), #3 (04.01.24), #4 (05.06.24), #5 (06.03.24), #6 (07.29.24), #7 (09.16.24) - persistent retinal hemorrhages along superior venule, superior to disc -- improved today - FA 6.6.23 shows delayed venous return through superior venule suggestive of BRVO - pt also reports history of recent TIA-like episode (telephone note on 4.12.23) -- pt experienced transient visual field loss and blurred vision that resolved over several minutes  - BP at previous visit (05/05/22) -- 171/73 - all of these factors raise concern / increased risk of possible cardiovascular compromise, I.e. CVA, MI, etc. - discussed findings and case with pt's PCP -- Dr. 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 Pseudophakia OU  - s/p CE/IOL (Dr. Fredda)  - IOLs in good position, doing well  - continue to monitor  7. Dry eyes OU - recommend artificial tears and lubricating ointment as needed - may do better with preservative free drops, given history with Pred Forte  and prolensa   Ophthalmic Meds Ordered this visit:  No orders of the defined types were placed in this encounter.    Return for f/u 3-4 months, BRVO OS, DFE, OCT.  There are no  Patient Instructions on file for this visit.  This document serves as a record of services personally performed by Redell JUDITHANN Hans, MD, PhD. It was created on their behalf by Alan PARAS. Delores, OA an ophthalmic technician. The creation of this record is the provider's dictation and/or activities during the visit.    Electronically signed by: Alan PARAS. Delores, OA 11/05/23 12:15 PM  Redell JUDITHANN Hans, M.D., Ph.D. Diseases & Surgery of the Retina and Vitreous [facility]  I have reviewed the above documentation for accuracy and completeness, and I agree with the above. Redell JUDITHANN Hans, M.D., Ph.D. 11/05/23 12:17 PM  Abbreviations: M myopia (nearsighted); A astigmatism; H hyperopia (farsighted); P presbyopia; Mrx spectacle prescription;  CTL contact lenses; OD right eye; OS left eye; OU both eyes  XT exotropia; ET esotropia; PEK punctate epithelial keratitis; PEE punctate epithelial erosions; DES dry eye syndrome; MGD meibomian gland dysfunction; ATs artificial tears; PFAT's preservative free artificial tears; NSC nuclear sclerotic cataract; PSC posterior subcapsular cataract; ERM epi-retinal membrane; PVD posterior vitreous detachment; RD retinal detachment; DM diabetes mellitus; DR diabetic retinopathy; NPDR non-proliferative diabetic retinopathy; PDR proliferative diabetic retinopathy; CSME clinically significant macular edema; DME diabetic macular edema; dbh dot blot hemorrhages; CWS cotton wool spot; POAG primary open angle glaucoma; C/D cup-to-disc ratio; HVF humphrey visual field; GVF goldmann visual field; OCT optical  coherence tomography; IOP intraocular pressure; BRVO Branch retinal vein occlusion; CRVO central retinal vein occlusion; CRAO central retinal artery occlusion; BRAO branch retinal artery occlusion; RT retinal tear; SB scleral buckle; PPV pars plana vitrectomy; VH Vitreous hemorrhage; PRP panretinal laser photocoagulation; IVK intravitreal kenalog ; VMT vitreomacular  traction; MH Macular hole;  NVD neovascularization of the disc; NVE neovascularization elsewhere; AREDS age related eye disease study; ARMD age related macular degeneration; POAG primary open angle glaucoma; EBMD epithelial/anterior basement membrane dystrophy; ACIOL anterior chamber intraocular lens; IOL intraocular lens; PCIOL posterior chamber intraocular lens; Phaco/IOL phacoemulsification with intraocular lens placement; PRK photorefractive keratectomy; LASIK laser assisted in situ keratomileusis; HTN hypertension; DM diabetes mellitus; COPD chronic obstructive pulmonary disease

## 2023-11-05 ENCOUNTER — Encounter (INDEPENDENT_AMBULATORY_CARE_PROVIDER_SITE_OTHER): Payer: Self-pay | Admitting: Ophthalmology

## 2023-11-05 ENCOUNTER — Ambulatory Visit (INDEPENDENT_AMBULATORY_CARE_PROVIDER_SITE_OTHER): Payer: Medicare Other | Admitting: Ophthalmology

## 2023-11-05 DIAGNOSIS — H35033 Hypertensive retinopathy, bilateral: Secondary | ICD-10-CM

## 2023-11-05 DIAGNOSIS — H35373 Puckering of macula, bilateral: Secondary | ICD-10-CM | POA: Diagnosis not present

## 2023-11-05 DIAGNOSIS — H04123 Dry eye syndrome of bilateral lacrimal glands: Secondary | ICD-10-CM

## 2023-11-05 DIAGNOSIS — H34832 Tributary (branch) retinal vein occlusion, left eye, with macular edema: Secondary | ICD-10-CM | POA: Diagnosis not present

## 2023-11-05 DIAGNOSIS — H35352 Cystoid macular degeneration, left eye: Secondary | ICD-10-CM

## 2023-11-05 DIAGNOSIS — Z961 Presence of intraocular lens: Secondary | ICD-10-CM | POA: Diagnosis not present

## 2023-11-05 DIAGNOSIS — I10 Essential (primary) hypertension: Secondary | ICD-10-CM

## 2023-11-13 DIAGNOSIS — R221 Localized swelling, mass and lump, neck: Secondary | ICD-10-CM | POA: Diagnosis not present

## 2023-11-13 DIAGNOSIS — R220 Localized swelling, mass and lump, head: Secondary | ICD-10-CM | POA: Diagnosis not present

## 2023-11-13 DIAGNOSIS — R040 Epistaxis: Secondary | ICD-10-CM | POA: Diagnosis not present

## 2023-11-13 DIAGNOSIS — H6991 Unspecified Eustachian tube disorder, right ear: Secondary | ICD-10-CM | POA: Diagnosis not present

## 2023-11-13 DIAGNOSIS — H93293 Other abnormal auditory perceptions, bilateral: Secondary | ICD-10-CM | POA: Diagnosis not present

## 2023-11-27 ENCOUNTER — Ambulatory Visit: Payer: Medicare Other | Admitting: Internal Medicine

## 2023-12-11 ENCOUNTER — Other Ambulatory Visit: Payer: Self-pay | Admitting: Internal Medicine

## 2023-12-11 ENCOUNTER — Ambulatory Visit: Payer: Medicare Other | Admitting: Internal Medicine

## 2023-12-11 ENCOUNTER — Encounter: Payer: Self-pay | Admitting: Internal Medicine

## 2023-12-11 VITALS — BP 120/70 | HR 84 | Temp 98.4°F | Resp 18 | Ht 63.0 in | Wt 151.1 lb

## 2023-12-11 DIAGNOSIS — R040 Epistaxis: Secondary | ICD-10-CM | POA: Diagnosis not present

## 2023-12-11 DIAGNOSIS — E11311 Type 2 diabetes mellitus with unspecified diabetic retinopathy with macular edema: Secondary | ICD-10-CM

## 2023-12-11 DIAGNOSIS — I10 Essential (primary) hypertension: Secondary | ICD-10-CM | POA: Diagnosis not present

## 2023-12-11 DIAGNOSIS — E041 Nontoxic single thyroid nodule: Secondary | ICD-10-CM | POA: Diagnosis not present

## 2023-12-11 DIAGNOSIS — N1831 Chronic kidney disease, stage 3a: Secondary | ICD-10-CM | POA: Diagnosis not present

## 2023-12-11 DIAGNOSIS — E782 Mixed hyperlipidemia: Secondary | ICD-10-CM

## 2023-12-11 NOTE — Progress Notes (Signed)
Office Visit  Subjective   Patient ID: Nancy Webster   DOB: 09/18/1928   Age: 89 y.o.   MRN: [id number]   Chief Complaint Chief Complaint  Patient presents with   Follow-up    2 month follow up     History of Present Illness 89 years old female who is here with follow up. She says that her sugar has been slightly high 151 to 164 mg/dl today and yesterday. Her HbA1c was 7.4% in July 24. Her GFR was 56 on 08/28/23. She saw eye doctor and she has retinopathy in both eyes and left eye macular edema. She is getting injection in her eyes and one she got yesterday. No hypoglycemia.    She has dyslipidemia and take repatha as she is intolerant to statin. Her LDL in September was target controlled. She take repatha injection twice a month.  She also has hypertension and take  amlodipine 2.5 mg and metoprolol 25 mg daily, her cardiologist gave her clonidine to take only when her SBP is high but she says that she ahs not used it.   She has CKD 3a with GRR 56 in 10/14. She is due for labs drawn today. She take lasix 20 mg 1/2 tablet PO daily.    She also says that she never heard from ENT.  Still worried about right neck swelling and she says that she get a nosebleed every day even without blowing her nose.  She is also worried about dusky coloration of her hard palate and her dentist wanted her to see ENT.       Past Medical History Past Medical History:  Diagnosis Date   Anxiety    Coronary artery disease    Diabetes mellitus without complication (HCC)    GERD (gastroesophageal reflux disease)    Hypertension    Renal disorder    stage 3     Allergies Allergies  Allergen Reactions   Other Other (See Comments)    Cramps to all Cholesterol medications   Statins Other (See Comments)    "cramps"  UNKNOWN   Atorvastatin Other (See Comments)    Muscle cramps  UNKNOWN     Review of Systems Review of Systems  Constitutional: Negative.   HENT:  Positive for nosebleeds.    Respiratory: Negative.    Cardiovascular: Negative.   Gastrointestinal: Negative.   Neurological: Negative.        Objective:    Vitals BP 120/70 (BP Location: Left Arm, Patient Position: Sitting, Cuff Size: Normal)   Pulse 84   Temp 98.4 F (36.9 C)   Resp 18   Ht 5\' 3"  (1.6 m)   Wt 151 lb 2 oz (68.5 kg)   SpO2 95%   BMI 26.77 kg/m    Physical Examination Physical Exam Constitutional:      Appearance: Normal appearance.  HENT:     Head: Normocephalic and atraumatic.  Neck:     Comments: Right side thyroid nodule palpable. Cardiovascular:     Rate and Rhythm: Normal rate and regular rhythm.     Heart sounds: Normal heart sounds.  Pulmonary:     Effort: Pulmonary effort is normal.     Breath sounds: Normal breath sounds.  Abdominal:     General: Bowel sounds are normal.     Palpations: Abdomen is soft.  Skin:    Coloration: Skin is jaundiced.  Neurological:     General: No focal deficit present.     Mental Status: She is  alert and oriented to person, place, and time.        Assessment & Plan:   Epistaxis, recurrent I will refer her to see ENT for recurrent nose bleed, she is also worried about discoloration of her hard palate and her dentist want her to see ENT as well.  Essential hypertension Controlled.  Diabetic retinopathy associated with type 2 diabetes mellitus (HCC) Blood sugar been running little high so I will do hemoglobin A1c and she has an appointment with eye doctor next month.  Thyroid nodule I will do thyroid ultrasound for evaluation.  Chronic kidney disease, stage 3a (HCC) I will repeat kidney function today.    Return in about 3 months (around 03/09/2024).   Eloisa Northern, MD

## 2023-12-11 NOTE — Assessment & Plan Note (Signed)
Blood sugar been running little high so I will do hemoglobin A1c and she has an appointment with eye doctor next month.

## 2023-12-11 NOTE — Assessment & Plan Note (Signed)
Controlled.

## 2023-12-11 NOTE — Assessment & Plan Note (Signed)
I will do thyroid ultrasound for evaluation.

## 2023-12-11 NOTE — Assessment & Plan Note (Signed)
I will repeat kidney function today.

## 2023-12-11 NOTE — Assessment & Plan Note (Signed)
I will refer her to see ENT for recurrent nose bleed, she is also worried about discoloration of her hard palate and her dentist want her to see ENT as well.

## 2023-12-12 LAB — COMPREHENSIVE METABOLIC PANEL
ALT: 20 [IU]/L (ref 0–32)
AST: 26 [IU]/L (ref 0–40)
Albumin: 4.4 g/dL (ref 3.6–4.6)
Alkaline Phosphatase: 105 [IU]/L (ref 44–121)
BUN/Creatinine Ratio: 19 (ref 12–28)
BUN: 25 mg/dL (ref 10–36)
Bilirubin Total: 0.2 mg/dL (ref 0.0–1.2)
CO2: 20 mmol/L (ref 20–29)
Calcium: 10 mg/dL (ref 8.7–10.3)
Chloride: 102 mmol/L (ref 96–106)
Creatinine, Ser: 1.29 mg/dL — ABNORMAL HIGH (ref 0.57–1.00)
Globulin, Total: 2.6 g/dL (ref 1.5–4.5)
Glucose: 111 mg/dL — ABNORMAL HIGH (ref 70–99)
Potassium: 5.9 mmol/L — ABNORMAL HIGH (ref 3.5–5.2)
Sodium: 144 mmol/L (ref 134–144)
Total Protein: 7 g/dL (ref 6.0–8.5)
eGFR: 39 mL/min/{1.73_m2} — ABNORMAL LOW (ref >59)

## 2023-12-12 LAB — HGB A1C W/O EAG: Hgb A1c MFr Bld: 7.5 % — ABNORMAL HIGH (ref 4.8–5.6)

## 2023-12-17 ENCOUNTER — Ambulatory Visit
Admission: RE | Admit: 2023-12-17 | Discharge: 2023-12-17 | Disposition: A | Discharge status: Home / Self Care | Payer: Medicare Other | Source: Ambulatory Visit | Attending: Internal Medicine | Admitting: Internal Medicine

## 2023-12-17 DIAGNOSIS — E041 Nontoxic single thyroid nodule: Secondary | ICD-10-CM | POA: Diagnosis not present

## 2023-12-18 ENCOUNTER — Ambulatory Visit: Payer: Medicare Other | Admitting: Internal Medicine

## 2023-12-18 ENCOUNTER — Other Ambulatory Visit: Payer: Self-pay | Admitting: Internal Medicine

## 2023-12-18 DIAGNOSIS — N1831 Chronic kidney disease, stage 3a: Secondary | ICD-10-CM | POA: Diagnosis not present

## 2023-12-19 LAB — POTASSIUM: Potassium: 5.4 mmol/L — ABNORMAL HIGH (ref 3.5–5.2)

## 2023-12-19 NOTE — Progress Notes (Unsigned)
Nurse visit  Blood work

## 2023-12-24 ENCOUNTER — Telehealth: Payer: Self-pay | Admitting: Internal Medicine

## 2023-12-24 NOTE — Telephone Encounter (Signed)
 I have spoken with her about her thyroid ultrasound result that it is benign appearing cyst and no further follow up

## 2023-12-31 ENCOUNTER — Other Ambulatory Visit: Payer: Self-pay | Admitting: Internal Medicine

## 2024-01-01 NOTE — Progress Notes (Signed)
 Cardiology Office Note   Date:  01/05/2024   ID:  Nancy Webster, DOB August 25, 1928, MRN [id number]  PCP:  Eloisa Northern, MD  Cardiologist:   Dietrich Pates, MD   Patient presents for follow-up of HTN and dizziness     History of Present Illness: Nancy Webster is a 89 y.o. female with a history of SVT, orthostatic hypotension (improved with change in meds), DM (age 66), HTN, HL, GERD, CKD, retinopathy, trigeminal neuralgia (s/p gamma knife).   I saw the pt in the fall, 2024  Since seen she says she is still having problesm with dizziness.  She denies syncope.    Pt says thie dizziness is still there   Most symptoms when rolling over in bed   When walks down the hall at [facility] will walk near the rail in case she has to hold on   Denies syncope  No CP  Breathing is OK BP at home 120s/ Busy with outdoor garden-- getting ready for spring   Current Meds  Medication Sig   acetaminophen (TYLENOL) 500 MG tablet Take 500 mg by mouth every 8 (eight) hours as needed for mild pain or headache.   amLODipine (NORVASC) 2.5 MG tablet Take 1 tablet (2.5 mg total) by mouth daily.   Biotin 5000 MCG TABS Take 1 tablet by mouth daily.   Blood Glucose Monitoring Suppl (FREESTYLE LITE) DEVI    Cholecalciferol (VITAMIN D3) 1.25 MG (50000 UT) TABS Take 1 tablet by mouth daily.   cloNIDine (CATAPRES) 0.1 MG tablet Take one tablet (0.1 mg) by mouth as needed for BP on top greater than 165 up to one time a day.   diclofenac Sodium (VOLTAREN) 1 % GEL Apply topically.   Evolocumab (REPATHA SURECLICK) 140 MG/ML SOAJ INJECT 140 MG INTO THE SKIN, IN THE ABDOMEN, THIGH, OR OUTER AREA OF UPPER ARM EVERY 14 DAYS   fluticasone (FLONASE) 50 MCG/ACT nasal spray USE 1 SPRAY IN EACH NOSTRIL TWICE DAILY *SHAKE GENTLY*   furosemide (LASIX) 20 MG tablet TAKE 1/2 TABLET = 10 MG BY MOUTH ONCE DAILY   glucose blood (FREESTYLE LITE) test strip    ibuprofen (ADVIL) 200 MG tablet Take 200 mg by mouth as needed for mild pain or moderate  pain.   magnesium oxide (MAG-OX) 400 (240 Mg) MG tablet Take 200 mg by mouth daily.   MEGARED OMEGA-3 KRILL OIL PO Take 1 tablet by mouth daily.   metFORMIN (GLUCOPHAGE) 500 MG tablet TAKE 1 TABLET BY MOUTH TWICE DAILY *TAKE WITH FOOD*   metoprolol succinate (TOPROL-XL) 25 MG 24 hr tablet TAKE 1 TABLET BY MOUTH ONCE DAILY *DO NOT CRUSH OR CHEW*   Multiple Vitamins-Minerals (CENTRUM SILVER ADULT 50+) TABS Take 1 tablet by mouth daily.   pantoprazole (PROTONIX) 40 MG tablet TAKE 1 TABLET BY MOUTH ONCE DAILY *DO NOT CRUSH OR CHEW*   Polyethyl Glycol-Propyl Glycol (SYSTANE) 0.4-0.3 % SOLN Place 1 drop into both eyes 2 (two) times daily.   sodium chloride (OCEAN) 0.65 % SOLN nasal spray Place 1 spray into both nostrils as needed for congestion.   [DISCONTINUED] amoxicillin-clavulanate (AUGMENTIN) 875-125 MG tablet Take 1 tablet by mouth 2 (two) times daily.     Allergies:   Other, Statins, and Atorvastatin   Past Medical History:  Diagnosis Date   Anxiety    Coronary artery disease    Diabetes mellitus without complication (HCC)    GERD (gastroesophageal reflux disease)    Hypertension    Renal disorder  stage 3    Past Surgical History:  Procedure Laterality Date   CATARACT EXTRACTION Bilateral    Khemsara   CHOLECYSTECTOMY N/A 03/02/2018   Procedure: LAPAROSCOPIC CHOLECYSTECTOMY;  Surgeon: Gaynelle Adu, MD;  Location: [facility] OR;  Service: General;  Laterality: N/A;   ERCP N/A 06/26/2019   Procedure: ENDOSCOPIC RETROGRADE CHOLANGIOPANCREATOGRAPHY (ERCP);  Surgeon: Kerin Salen, MD;  Location: [facility] ENDOSCOPY;  Service: Gastroenterology;  Laterality: N/A;   SPHINCTEROTOMY  06/26/2019   Procedure: SPHINCTEROTOMY;  Surgeon: Kerin Salen, MD;  Location: [facility] ENDOSCOPY;  Service: Gastroenterology;;     Social History:  The patient  reports that she has never smoked. She has never used smokeless tobacco. She reports that she does not currently use alcohol. She reports that she does not use drugs.    Family History:  The patient's family history includes Diabetes in her daughter; Heart failure in her mother.    ROS:  Please see the history of present illness. All other systems are reviewed and  Negative to the above problem except as noted.    PHYSICAL EXAM: VS:  BP (!) 162/72 (BP Location: Left Arm, Patient Position: Sitting, Cuff Size: Normal)   Pulse 69   Ht 5\' 4"  (1.626 m)   Wt 150 lb 6.4 oz (68.2 kg)   SpO2 94%   BMI 25.82 kg/m    BP laying 177/83  PR 83  Sitting  175/81  P 65  Dizzy, lightheaded  Standing   168/80  P 66  No symptoms   Standing 3 min 176/78  P 65  Dizzy  GEN: Pt is in NAD   HEENT: normal  Neck: JVP is normal , no carotid bruits Cardiac: RRR; no murmurs  No  LE edema  Respiratory:  clear to auscultation  GI: soft, nontender,,No hepatomegaly     EKG:  EKG is not done   Monitor March 2022  Normal sinus rhythm with frequent PACs, rare PVCs. Short runs of SVT. No atrial fibrillation. No sustained pathologic arrhythmias.   Lipid Panel    Component Value Date/Time   CHOL 123 07/17/2023 1430   TRIG 337 (H) 07/17/2023 1430   HDL 52 07/17/2023 1430   CHOLHDL 2.3 07/10/2022 1013   CHOLHDL 4.0 06/24/2019 2207   VLDL 26 06/24/2019 2207   LDLCALC 23 07/17/2023 1430      Wt Readings from Last 3 Encounters:  01/04/24 150 lb 6.4 oz (68.2 kg)  12/11/23 151 lb 2 oz (68.5 kg)  10/09/23 150 lb (68 kg)      ASSESSMENT AND PLAN:  1 HTN   BP is severely elevated today  She says it is good by home cuff and at other offices    Would get labs today   I want to go home and check BP  She has clonidine to take as needed for SBP over 160/ With her hx of very labile BP do not want her to get hypotensive and fall     2  Dizziness  Not orthostatic today  BP is just high   Follow     3  Hx SVT No recurrence   Pt denies palpitaitons   4  HL    Will check labs   5  PAD  Pt with significant atherosclerosis of aorta   Follow  Rx risk factors  6  DM   PT on  metformin   Last A1C 7.5  Reviewed diet       Current medicines are reviewed at length  with the patient today.  The patient does not have concerns regarding medicines.  Signed, Dietrich Pates, MD  01/05/2024 5:00 PM    [facility] [address] [place], [place], [place]  [address] Phone: [phone]; Fax: [phone]

## 2024-01-04 ENCOUNTER — Encounter: Payer: Self-pay | Admitting: Internal Medicine

## 2024-01-04 ENCOUNTER — Ambulatory Visit: Payer: Medicare Other | Attending: Internal Medicine | Admitting: Internal Medicine

## 2024-01-04 ENCOUNTER — Other Ambulatory Visit: Payer: Self-pay | Admitting: *Deleted

## 2024-01-04 VITALS — BP 162/72 | HR 69 | Ht 64.0 in | Wt 150.4 lb

## 2024-01-04 DIAGNOSIS — I10 Essential (primary) hypertension: Secondary | ICD-10-CM | POA: Insufficient documentation

## 2024-01-04 DIAGNOSIS — I509 Heart failure, unspecified: Secondary | ICD-10-CM

## 2024-01-04 DIAGNOSIS — E782 Mixed hyperlipidemia: Secondary | ICD-10-CM

## 2024-01-04 DIAGNOSIS — E11311 Type 2 diabetes mellitus with unspecified diabetic retinopathy with macular edema: Secondary | ICD-10-CM | POA: Insufficient documentation

## 2024-01-04 DIAGNOSIS — E1021 Type 1 diabetes mellitus with diabetic nephropathy: Secondary | ICD-10-CM | POA: Diagnosis not present

## 2024-01-04 DIAGNOSIS — E7800 Pure hypercholesterolemia, unspecified: Secondary | ICD-10-CM | POA: Diagnosis not present

## 2024-01-04 NOTE — Patient Instructions (Addendum)
 Medication Instructions:  NO CHANGES *If you need a refill on your cardiac medications before your next appointment, please call your pharmacy*   Lab Work: TODAY CBC, BME,T A1C, LIPO PROFILE, AND TSH  If you have labs (blood work) drawn today and your tests are completely normal, you will receive your results only by: MyChart Message (if you have MyChart) OR A paper copy in the mail If you have any lab test that is abnormal or we need to change your treatment, we will call you to review the results.   Testing/Procedures: NONE   Follow-Up: At [facility], you and your health needs are our priority.  As part of our continuing mission to provide you with exceptional heart care, we have created designated Provider Care Teams.  These Care Teams include your primary Cardiologist (physician) and Advanced Practice Providers (APPs -  Physician Assistants and Nurse Practitioners) who all work together to provide you with the care you need, when you need it.  We recommend signing up for the patient portal called "MyChart".  Sign up information is provided on this After Visit Summary.  MyChart is used to connect with patients for Virtual Visits (Telemedicine).  Patients are able to view lab/test results, encounter notes, upcoming appointments, etc.  Non-urgent messages can be sent to your provider as well.   To learn more about what you can do with MyChart, go to [email or web address].    Your next appointment:   PENDING    Provider:      Other Instructions NONE

## 2024-01-05 LAB — NMR, LIPOPROFILE
Cholesterol, Total: 117 mg/dL (ref 100–199)
HDL Particle Number: 36.2 umol/L (ref >=30.5)
HDL-C: 48 mg/dL (ref >39)
LDL Particle Number: 643 nmol/L (ref <1000)
LDL Size: 19.7 nm — ABNORMAL LOW (ref >20.5)
LDL-C (NIH Calc): 39 mg/dL (ref 0–99)
LP-IR Score: 60 — ABNORMAL HIGH (ref <=45)
Small LDL Particle Number: 482 nmol/L (ref <=527)
Triglycerides: 187 mg/dL — ABNORMAL HIGH (ref 0–149)

## 2024-01-05 LAB — CBC
Hematocrit: 41.6 % (ref 34.0–46.6)
Hemoglobin: 13 g/dL (ref 11.1–15.9)
MCH: 30.4 pg (ref 26.6–33.0)
MCHC: 31.3 g/dL — ABNORMAL LOW (ref 31.5–35.7)
MCV: 97 fL (ref 79–97)
Platelets: 295 10*3/uL (ref 150–450)
RBC: 4.28 x10E6/uL (ref 3.77–5.28)
RDW: 12.4 % (ref 11.7–15.4)
WBC: 9.3 10*3/uL (ref 3.4–10.8)

## 2024-01-05 LAB — TSH: TSH: 2.21 u[IU]/mL (ref 0.450–4.500)

## 2024-01-08 ENCOUNTER — Ambulatory Visit: Admitting: Internal Medicine

## 2024-01-08 VITALS — BP 132/72 | HR 75 | Resp 18

## 2024-01-08 DIAGNOSIS — I10 Essential (primary) hypertension: Secondary | ICD-10-CM

## 2024-01-08 NOTE — Progress Notes (Signed)
Nurse visit Bp check

## 2024-01-11 ENCOUNTER — Telehealth: Payer: Self-pay

## 2024-01-11 NOTE — Telephone Encounter (Signed)
 I called to check on the pt and see how her BP was:   She saw Dr Nelson Chimes this past Tuesday and they checked her BP and her cuff and they were very "comparable"....her BP with their cuff was 132/67 and her cuff 133/70.   Wed at 3:30 om 115/59 Th at 2 pm 121/59, 9 pm 164/77 so she took 1/2 Clonidine (0.05 mg) Fri morning before meds 141/57 at 6 am  Pt says she feels well and will continue to monitor and if she is consistently taking the extra Clonidine for elevated BP to call and let us know.

## 2024-01-11 NOTE — Telephone Encounter (Signed)
-----   Message from Nurse Dewayne Hatch B sent at 01/07/2024  6:06 PM EDT ----- The patient has been notified of the result and verbalized understanding.  All questions (if any) were answered. Bertram Millard, RN 01/07/2024 6:04 PM   I will call and check on the pt later this week after she has her BP and her cuff checked.

## 2024-01-21 ENCOUNTER — Other Ambulatory Visit: Payer: Self-pay | Admitting: Internal Medicine

## 2024-01-31 NOTE — Progress Notes (Shared)
 [facility] - Clinic Note  02/04/2024     CHIEF COMPLAINT Patient presents for Retina Follow Up  HISTORY OF PRESENT ILLNESS: Nancy Webster is a 89 y.o. female who presents to the clinic today for:   HPI     Retina Follow Up   Patient presents with  CRVO/BRVO.  In both eyes.  This started 3 months ago.  Duration of 3 months.  Since onset it is stable.  I, the attending physician,  performed the HPI with the patient and updated documentation appropriately.        Comments   3 month retina follow up BRVO OS pt is reporting vision is about the same she is reporting more dryness she is using gtts 2-4 per day she denies any flashes just some floaters       Last edited by Rennis Chris, MD on 02/05/2024 12:26 AM.     Pt states she's discouraged about the vision not improving.   Referring physician: Maris Berger, MD [address] [place],  [place] [address]  HISTORICAL INFORMATION:  Selected notes from the MEDICAL RECORD NUMBER Referred by Dr. [name] for eval of ERM OU   CURRENT MEDICATIONS: Current Outpatient Medications (Ophthalmic Drugs)  Medication Sig   Polyethyl Glycol-Propyl Glycol (SYSTANE) 0.4-0.3 % SOLN Place 1 drop into both eyes 2 (two) times daily.   No current facility-administered medications for this visit. (Ophthalmic Drugs)   Current Outpatient Medications (Other)  Medication Sig   acetaminophen (TYLENOL) 500 MG tablet Take 500 mg by mouth every 8 (eight) hours as needed for mild pain or headache.   amLODipine (NORVASC) 2.5 MG tablet Take 1 tablet (2.5 mg total) by mouth daily.   Biotin 5000 MCG TABS Take 1 tablet by mouth daily.   Blood Glucose Monitoring Suppl (FREESTYLE LITE) DEVI    Cholecalciferol (VITAMIN D3) 1.25 MG (50000 UT) TABS Take 1 tablet by mouth daily.   cloNIDine (CATAPRES) 0.1 MG tablet Take one tablet (0.1 mg) by mouth as needed for BP on top greater than 165 up to one time a day.   diclofenac Sodium (VOLTAREN) 1 %  GEL Apply topically.   Evolocumab (REPATHA SURECLICK) 140 MG/ML SOAJ INJECT 140 MG INTO THE SKIN, IN THE ABDOMEN, THIGH, OR OUTER AREA OF UPPER ARM EVERY 14 DAYS   fluticasone (FLONASE) 50 MCG/ACT nasal spray USE 1 SPRAY IN EACH NOSTRIL TWICE DAILY *SHAKE GENTLY*   furosemide (LASIX) 20 MG tablet TAKE 1/2 TABLET = 10 MG BY MOUTH ONCE DAILY   glucose blood (FREESTYLE LITE) test strip    ibuprofen (ADVIL) 200 MG tablet Take 200 mg by mouth as needed for mild pain or moderate pain.   magnesium oxide (MAG-OX) 400 (240 Mg) MG tablet Take 200 mg by mouth daily.   MEGARED OMEGA-3 KRILL OIL PO Take 1 tablet by mouth daily.   metFORMIN (GLUCOPHAGE) 500 MG tablet TAKE 1 TABLET BY MOUTH TWICE DAILY *TAKE WITH FOOD*   metoprolol succinate (TOPROL-XL) 25 MG 24 hr tablet TAKE 1 TABLET BY MOUTH ONCE DAILY *DO NOT CRUSH OR CHEW*   Multiple Vitamins-Minerals (CENTRUM SILVER ADULT 50+) TABS Take 1 tablet by mouth daily.   pantoprazole (PROTONIX) 40 MG tablet TAKE 1 TABLET BY MOUTH ONCE DAILY *DO NOT CRUSH OR CHEW*   sodium chloride (OCEAN) 0.65 % SOLN nasal spray Place 1 spray into both nostrils as needed for congestion.   No current facility-administered medications for this visit. (Other)   REVIEW OF  SYSTEMS: ROS   Positive for: Endocrine, Cardiovascular, Eyes Negative for: Constitutional, Gastrointestinal, Neurological, Skin, Genitourinary, Musculoskeletal, HENT, Respiratory, Psychiatric, Allergic/Imm, Heme/Lymph Last edited by Etheleen Mayhew, COT on 02/04/2024  9:23 AM.       ALLERGIES Allergies  Allergen Reactions   Other Other (See Comments)    Cramps to all Cholesterol medications   Statins Other (See Comments)    "cramps"  UNKNOWN   Atorvastatin Other (See Comments)    Muscle cramps  UNKNOWN   PAST MEDICAL HISTORY Past Medical History:  Diagnosis Date   Anxiety    Coronary artery disease    Diabetes mellitus without complication (HCC)    GERD (gastroesophageal reflux disease)     Hypertension    Renal disorder    stage 3   Past Surgical History:  Procedure Laterality Date   CATARACT EXTRACTION Bilateral    Khemsara   CHOLECYSTECTOMY N/A 03/02/2018   Procedure: LAPAROSCOPIC CHOLECYSTECTOMY;  Surgeon: Gaynelle Adu, MD;  Location: [facility] OR;  Service: General;  Laterality: N/A;   ERCP N/A 06/26/2019   Procedure: ENDOSCOPIC RETROGRADE CHOLANGIOPANCREATOGRAPHY (ERCP);  Surgeon: Kerin Salen, MD;  Location: [facility] ENDOSCOPY;  Service: Gastroenterology;  Laterality: N/A;   SPHINCTEROTOMY  06/26/2019   Procedure: SPHINCTEROTOMY;  Surgeon: Kerin Salen, MD;  Location: [facility] ENDOSCOPY;  Service: Gastroenterology;;   FAMILY HISTORY Family History  Problem Relation Age of Onset   Heart failure Mother    Diabetes Daughter    SOCIAL HISTORY Social History   Tobacco Use   Smoking status: Never   Smokeless tobacco: Never  Vaping Use   Vaping status: Never Used  Substance Use Topics   Alcohol use: Not Currently   Drug use: Never       OPHTHALMIC EXAM: Base Eye Exam     Visual Acuity (Snellen - Linear)       Right Left   Dist [place] 20/30 20/150   Dist ph [place] NI NI         Tonometry (Tonopen, 9:32 AM)       Right Left   Pressure 12 14         Pupils       Pupils Dark Light Shape React APD   Right PERRL 3 2 Round Brisk None   Left PERRL 3 2 Round Brisk None         Visual Fields       Left Right    Full Full         Extraocular Movement       Right Left    Full, Ortho Full, Ortho         Neuro/Psych     Oriented x3: Yes   Mood/Affect: Normal         Dilation     Both eyes: 2.5% Phenylephrine @ 9:32 AM           Slit Lamp and Fundus Exam     Slit Lamp Exam       Right Left   Lids/Lashes Dermatochalasis - upper lid, mild MGD Dermatochalasis - upper lid, mild MGD, erythema UL and LL   Conjunctiva/Sclera White and quiet White and quiet   Cornea well healed cataract wound, mild arcus, trace Punctate epithelial erosions, tear  film debris, EBMD 1-2+ fine PEE, well healed cataract wound, tear film debris, fine endo pigment   Anterior Chamber Deep and clear deep and clear   Iris Round and poorly dilated to 4.14mm Irregular dilation, focal atrophy and TID from  0200-0300   Lens PC IOL in good position with open PC PC IOL in good position with open PC   Anterior Vitreous Vitreous syneresis, Posterior vitreous detachment, vitreous condensations Vitreous syneresis, Posterior vitreous detachment         Fundus Exam       Right Left   Disc mild Pallor, Sharp rim, PPA mild Pallor, mild temporal PPA, mild cupping, sharp rim, tilted, fine vascular loops on ST rim   C/D Ratio 0.5 0.65   Macula Flat, Blunted foveal reflex, mild ERM, RPE mottling, No heme or edema, fine drusen Flat, Blunted foveal reflex, ERM with +cystic changes, persistent central SRF, no heme   Vessels attenuated, Tortuous attenuated, Tortuous   Periphery Attached, blonde fundus, No heme Attached, blonde fundus, persistent punctate MA/IRH superior midzone along superior venule -- BRVO, no edema, reticular degeneration, No heme           IMAGING AND PROCEDURES  Imaging and Procedures for 02/04/2024  OCT, Retina - OU - Both Eyes       Right Eye Quality was good. Central Foveal Thickness: 399. Progression has been stable. Findings include no IRF, no SRF, abnormal foveal contour, retinal drusen , epiretinal membrane, macular pucker, vitreomacular adhesion (ERM with Blunted foveal contour and central thickening, partial PVD -- stable).   Left Eye Quality was good. Central Foveal Thickness: 484. Progression has been stable. Findings include abnormal foveal contour, retinal drusen , epiretinal membrane, intraretinal fluid, subretinal fluid, vitreomacular adhesion (Persistent central IRF/SRF, persistent ERM, ellipsoid disruption under ERM).   Notes *Images captured and stored on drive  Diagnosis / Impression:  ERM OU  OD: ERM with Blunted foveal contour  and central thickening, partial PVD -- stable OS: Persistent central IRF/SRF, persistent ERM, ellipsoid disruption under ERM  Clinical management:  See below  Abbreviations: NFP - Normal foveal profile. CME - cystoid macular edema. PED - pigment epithelial detachment. IRF - intraretinal fluid. SRF - subretinal fluid. EZ - ellipsoid zone. ERM - epiretinal membrane. ORA - outer retinal atrophy. ORT - outer retinal tubulation. SRHM - subretinal hyper-reflective material. IRHM - intraretinal hyper-reflective material            ASSESSMENT/PLAN:    ICD-10-CM   1. Epiretinal membrane (ERM) of both eyes  H35.373 OCT, Retina - OU - Both Eyes    2. Cystoid macular edema of left eye  H35.352     3. Essential hypertension  I10     4. Hypertensive retinopathy of both eyes  H35.033     5. Branch retinal vein occlusion of left eye with macular edema  H34.8320     6. Pseudophakia, both eyes  Z96.1     7. Dry eyes  H04.123      1,2. Epiretinal membrane, both eyes  - +ERM w/ blunting of foveal contour OU -- OS with +IRF / persistent cystic changes - FA (11.01.22) shows mild perifoveal staining / leakage OS suggestive of CME component - repeat FA (06.06.23) shows OD: Mild, late peripapillary staining; OS: Delayed venous return of superior venule, punctate perivascular blockage along superior venule -- superior BRVO outside of macula  - started on PF and Prolensa QID OS on 11.1.22 - s/p STK OS #1 (12.20.22) - BCVA OD: 20/30; OS: 20/150 -- both stable - OCT shows OD: ERM with Blunted foveal contour and central thickening, partial PVD -- stable; OS: Persistent central IRF/SRF, persistent ERM, ellipsoid disruption in ERM - d/c'd PF and Prolensa BID OS due to intolerance -  STK consent signed 12.20.22 - pt wishes to hold off on ERM surgery - f/u 4-6 months DFE, OCT  3,4. Hypertensive retinopathy OU  - BP in office 6.6.23 was 171/73 - discussed importance of tight BP control and relationship to  vision and BRVO OS - continue to monitor  5. BRVO OS - s/p IVA OS #1 (08.10.23), #2 (09.07.23), #3 (10.06.23), #4 (11.06.23), #5 (12.11.23) -- IVA resistance - s/p IVE OS #1 (01.22.24), #2 (02.26.24), #3 (04.01.24), #4 (05.06.24), #5 (06.03.24), #6 (07.29.24), #7 (09.16.24) - persistent retinal hemorrhages along superior venule, superior to disc -- improved today - FA 6.6.23 shows delayed venous return through superior venule suggestive of BRVO - pt also reports history of recent TIA-like episode (telephone note on 4.12.23) -- pt experienced transient visual field loss and blurred vision that resolved over several minutes  - BP at previous visit (05/05/22) -- 171/73 - all of these factors raise concern / increased risk of possible cardiovascular compromise, I.e. CVA, MI, etc. - discussed findings and case with pt's PCP -- Dr. 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 Pseudophakia OU  - s/p CE/IOL (Dr. Lisette Abu)  - IOLs in good position, doing well  - continue to monitor  7. Dry eyes OU - recommend artificial tears and lubricating ointment as needed - may do better with preservative free drops, given history with Pred Forte and prolensa  Ophthalmic Meds Ordered this visit:  No orders of the defined types were placed in this encounter.    Return for f/u 4-6 mo ERM OU, BRVO OS , DFE, OCT.  There are no Patient Instructions on  file for this visit.  This document serves as a record of services personally performed by Karie Chimera, MD, PhD. It was created on their behalf by Glee Arvin. Manson Passey, OA an ophthalmic technician. The creation of this record is the provider's dictation and/or activities during the visit.    Electronically signed by: Glee Arvin. Manson Passey, OA 02/05/24 12:37 AM  This document serves as a record of services personally performed by Karie Chimera, MD, PhD. It was created on their behalf by Charlette Caffey, COT an ophthalmic technician. The creation of this record is the provider's dictation and/or activities during the visit.    Electronically signed by:  Charlette Caffey, COT  02/05/24 12:37 AM  Karie Chimera, M.D., Ph.D. Diseases & Surgery of the Retina and Vitreous [facility]  I have reviewed the above documentation for accuracy and completeness, and I agree with the above. Karie Chimera, M.D., Ph.D. 02/05/24 12:37 AM   Abbreviations: M myopia (nearsighted); A astigmatism; H hyperopia (farsighted); P presbyopia; Mrx spectacle prescription;  CTL contact lenses; OD right eye; OS left eye; OU both eyes  XT exotropia; ET esotropia; PEK punctate epithelial keratitis; PEE punctate epithelial erosions; DES dry eye syndrome; MGD meibomian gland dysfunction; ATs artificial tears; PFAT's preservative free artificial  tears; NSC nuclear sclerotic cataract; PSC posterior subcapsular cataract; ERM epi-retinal membrane; PVD posterior vitreous detachment; RD retinal detachment; DM diabetes mellitus; DR diabetic retinopathy; NPDR non-proliferative diabetic retinopathy; PDR proliferative diabetic retinopathy; CSME clinically significant macular edema; DME diabetic macular edema; dbh dot blot hemorrhages; CWS cotton wool spot; POAG primary open angle glaucoma; C/D cup-to-disc ratio; HVF humphrey visual field; GVF goldmann visual field; OCT optical coherence tomography; IOP intraocular  pressure; BRVO Branch retinal vein occlusion; CRVO central retinal vein occlusion; CRAO central retinal artery occlusion; BRAO branch retinal artery occlusion; RT retinal tear; SB scleral buckle; PPV pars plana vitrectomy; VH Vitreous hemorrhage; PRP panretinal laser photocoagulation; IVK intravitreal kenalog; VMT vitreomacular traction; MH Macular hole;  NVD neovascularization of the disc; NVE neovascularization elsewhere; AREDS age related eye disease study; ARMD age related macular degeneration; POAG primary open angle glaucoma; EBMD epithelial/anterior basement membrane dystrophy; ACIOL anterior chamber intraocular lens; IOL intraocular lens; PCIOL posterior chamber intraocular lens; Phaco/IOL phacoemulsification with intraocular lens placement; PRK photorefractive keratectomy; LASIK laser assisted in situ keratomileusis; HTN hypertension; DM diabetes mellitus; COPD chronic obstructive pulmonary disease

## 2024-02-04 ENCOUNTER — Ambulatory Visit (INDEPENDENT_AMBULATORY_CARE_PROVIDER_SITE_OTHER): Payer: Medicare Other | Admitting: Ophthalmology

## 2024-02-04 ENCOUNTER — Encounter (INDEPENDENT_AMBULATORY_CARE_PROVIDER_SITE_OTHER): Payer: Self-pay | Admitting: Ophthalmology

## 2024-02-04 DIAGNOSIS — H34832 Tributary (branch) retinal vein occlusion, left eye, with macular edema: Secondary | ICD-10-CM | POA: Diagnosis not present

## 2024-02-04 DIAGNOSIS — H04123 Dry eye syndrome of bilateral lacrimal glands: Secondary | ICD-10-CM | POA: Diagnosis not present

## 2024-02-04 DIAGNOSIS — I10 Essential (primary) hypertension: Secondary | ICD-10-CM

## 2024-02-04 DIAGNOSIS — H35033 Hypertensive retinopathy, bilateral: Secondary | ICD-10-CM | POA: Diagnosis not present

## 2024-02-04 DIAGNOSIS — H35373 Puckering of macula, bilateral: Secondary | ICD-10-CM | POA: Diagnosis not present

## 2024-02-04 DIAGNOSIS — H35352 Cystoid macular degeneration, left eye: Secondary | ICD-10-CM | POA: Diagnosis not present

## 2024-02-04 DIAGNOSIS — Z961 Presence of intraocular lens: Secondary | ICD-10-CM

## 2024-02-05 ENCOUNTER — Encounter (INDEPENDENT_AMBULATORY_CARE_PROVIDER_SITE_OTHER): Payer: Self-pay | Admitting: Ophthalmology

## 2024-02-26 DIAGNOSIS — L821 Other seborrheic keratosis: Secondary | ICD-10-CM | POA: Diagnosis not present

## 2024-02-26 DIAGNOSIS — L570 Actinic keratosis: Secondary | ICD-10-CM | POA: Diagnosis not present

## 2024-03-11 ENCOUNTER — Ambulatory Visit: Payer: Medicare Other | Admitting: Internal Medicine

## 2024-03-18 ENCOUNTER — Encounter: Payer: Self-pay | Admitting: Internal Medicine

## 2024-03-18 ENCOUNTER — Ambulatory Visit: Admitting: Internal Medicine

## 2024-03-18 VITALS — BP 126/88 | HR 83 | Temp 97.2°F | Resp 18 | Ht 64.0 in | Wt 148.0 lb

## 2024-03-18 DIAGNOSIS — E11311 Type 2 diabetes mellitus with unspecified diabetic retinopathy with macular edema: Secondary | ICD-10-CM

## 2024-03-18 DIAGNOSIS — I10 Essential (primary) hypertension: Secondary | ICD-10-CM

## 2024-03-18 DIAGNOSIS — N1831 Chronic kidney disease, stage 3a: Secondary | ICD-10-CM

## 2024-03-18 DIAGNOSIS — E782 Mixed hyperlipidemia: Secondary | ICD-10-CM | POA: Diagnosis not present

## 2024-03-18 NOTE — Assessment & Plan Note (Signed)
 Her blood pressure is controlled.

## 2024-03-18 NOTE — Assessment & Plan Note (Signed)
 She is on Repatha  twice a month.  Will continue with that.

## 2024-03-18 NOTE — Assessment & Plan Note (Signed)
 I will repeat renal function.

## 2024-03-18 NOTE — Assessment & Plan Note (Signed)
 She will not get any further injection as it is not helping.  Will continue to control blood sugar.

## 2024-03-18 NOTE — Progress Notes (Signed)
 Office Visit  Subjective   Patient ID: TAUNYA GORAL   DOB: [birth year]   Age: 89 y.o.   MRN: [id number]   Chief Complaint Chief Complaint  Patient presents with   Follow-up    3 month follow up     History of Present Illness 89 years old female who is here with follow up. She says that she is coming back from [place]  where she went to visit her son.  She saw heart doctor.  She says that she is able to do everything by herself without any problem.  She says that her sugar has been little bit high.  Fasting sugar was 165. Her hemoglobin A1c was 7.5 on December 11, 2023. She has retinopathy and left macular edema for that she was getting injection but that was stopped because it was not helping her.  She will see eye doctor yearly now.  She says that her vision is slowly deteriorating.  Her creatinine was 1.293 months ago with GFR of 39.    She has dyslipidemia and take repatha  as she is intolerant to statin. Her LDL in September was target controlled. She take repatha  injection twice a month.  She also has hypertension and take  amlodipine  2.5 mg and metoprolol  25 mg daily, her cardiologist gave her clonidine  to take only when her SBP is high but she says that she ahs not used it.         Past Medical History Past Medical History:  Diagnosis Date   Anxiety    Coronary artery disease    Diabetes mellitus without complication (HCC)    GERD (gastroesophageal reflux disease)    Hypertension    Renal disorder    stage 3     Allergies Allergies  Allergen Reactions   Other Other (See Comments)    Cramps to all Cholesterol medications   Statins Other (See Comments)    "cramps"  UNKNOWN   Atorvastatin Other (See Comments)    Muscle cramps  UNKNOWN     Review of Systems Review of Systems  Constitutional: Negative.   HENT: Negative.    Respiratory: Negative.    Cardiovascular: Negative.   Gastrointestinal: Negative.   Neurological: Negative.        Objective:     Vitals BP 126/88   Pulse 83   Temp (!) 97.2 F (36.2 C)   Resp 18   Ht 5\' 4"  (1.626 m)   Wt 148 lb (67.1 kg)   SpO2 95%   BMI 25.40 kg/m    Physical Examination Physical Exam Constitutional:      Appearance: Normal appearance.  HENT:     Head: Normocephalic and atraumatic.  Cardiovascular:     Rate and Rhythm: Normal rate and regular rhythm.     Heart sounds: Normal heart sounds.  Pulmonary:     Effort: Pulmonary effort is normal.     Breath sounds: Normal breath sounds.  Abdominal:     General: Bowel sounds are normal.     Palpations: Abdomen is soft.  Neurological:     General: No focal deficit present.     Mental Status: She is alert and oriented to person, place, and time.       Assessment & Plan:   Essential hypertension   Her blood pressure is controlled.  Diabetic retinopathy associated with type 2 diabetes mellitus (HCC)   She will not get any further injection as it is not helping.  Will continue to control blood sugar.  Chronic kidney disease, stage 3a (HCC)   I will repeat renal function.  Hyperlipidemia  She is on Repatha  twice a month.  Will continue with that.    Return in about 3 months (around 06/18/2024).   Tita Form, MD

## 2024-04-21 ENCOUNTER — Other Ambulatory Visit: Payer: Self-pay | Admitting: Internal Medicine

## 2024-04-24 ENCOUNTER — Other Ambulatory Visit: Payer: Self-pay | Admitting: Internal Medicine

## 2024-05-19 ENCOUNTER — Other Ambulatory Visit: Payer: Self-pay | Admitting: Internal Medicine

## 2024-05-19 ENCOUNTER — Other Ambulatory Visit: Payer: Self-pay

## 2024-05-22 NOTE — Progress Notes (Signed)
 [facility] - Clinic Note  06/02/2024     CHIEF COMPLAINT Patient presents for Retina Follow Up  HISTORY OF PRESENT ILLNESS: Nancy Webster is a 89 y.o. female who presents to the clinic today for:   HPI     Retina Follow Up   Patient presents with  Other.  In both eyes.  This started 4 months ago.  I, the attending physician,  performed the HPI with the patient and updated documentation appropriately.        Comments   Patient here for 4 month retina follow up for ERM OU. Patient states vision doing poorly. Still gets some. They have hurt some. OD seems to have a neve around it down to side of nose to lip bothersome.       Last edited by Valdemar Rogue, MD on 06/02/2024  9:54 AM.     Pt states she feels like she is having more problems with her eyes, she feels like they are irritated, she is using AT's and allergy drops, she states they do help to soothe her eyes, but don't improve her vision  Referring physician: Leslee Reusing, MD [address] [place],  [place] [address]  HISTORICAL INFORMATION:  Selected notes from the MEDICAL RECORD NUMBER Referred by Dr. McCuen for eval of ERM OU   CURRENT MEDICATIONS: Current Outpatient Medications (Ophthalmic Drugs)  Medication Sig   Polyethyl Glycol-Propyl Glycol (SYSTANE) 0.4-0.3 % SOLN Place 1 drop into both eyes 2 (two) times daily.   No current facility-administered medications for this visit. (Ophthalmic Drugs)   Current Outpatient Medications (Other)  Medication Sig   acetaminophen  (TYLENOL ) 500 MG tablet Take 500 mg by mouth every 8 (eight) hours as needed for mild pain or headache.   amLODipine  (NORVASC ) 2.5 MG tablet TAKE 1 TABLET BY MOUTH ONCE DAILY   Biotin  5000 MCG TABS Take 1 tablet by mouth daily.   Blood Glucose Monitoring Suppl (FREESTYLE LITE) DEVI    Cholecalciferol (VITAMIN D3) 1.25 MG (50000 UT) TABS Take 1 tablet by mouth daily.   cloNIDine  (CATAPRES ) 0.1 MG tablet Take one tablet (0.1  mg) by mouth as needed for BP on top greater than 165 up to one time a day.   diclofenac Sodium (VOLTAREN) 1 % GEL Apply topically.   fluorouracil (EFUDEX) 5 % cream Apply 1 Application topically 2 (two) times daily.   fluticasone  (FLONASE ) 50 MCG/ACT nasal spray USE 1 SPRAY IN EACH NOSTRIL TWICE DAILY *SHAKE GENTLY*   furosemide  (LASIX ) 20 MG tablet TAKE 1/2 TABLET = 10 MG BY MOUTH ONCE DAILY   glucose blood (FREESTYLE LITE) test strip    ibuprofen (ADVIL) 200 MG tablet Take 200 mg by mouth as needed for mild pain or moderate pain.   magnesium oxide (MAG-OX) 400 (240 Mg) MG tablet Take 200 mg by mouth daily.   MEGARED OMEGA-3 KRILL OIL PO Take 1 tablet by mouth daily.   metFORMIN (GLUCOPHAGE) 500 MG tablet TAKE 1 TABLET BY MOUTH TWICE DAILY *TAKE WITH FOOD*   metoprolol  succinate (TOPROL -XL) 25 MG 24 hr tablet TAKE 1 TABLET BY MOUTH ONCE DAILY *DO NOT CRUSH OR CHEW*   Multiple Vitamins-Minerals (CENTRUM SILVER  ADULT 50+) TABS Take 1 tablet by mouth daily.   pantoprazole  (PROTONIX ) 40 MG tablet TAKE 1 TABLET BY MOUTH ONCE DAILY   REPATHA  SURECLICK 140 MG/ML SOAJ INJECT 140 MG INTO THE SKIN, IN THE ABDOMEN, THIGH, OR OUTER AREA OF UPPER ARM EVERY 14 DAYS  sodium chloride  (OCEAN) 0.65 % SOLN nasal spray Place 1 spray into both nostrils as needed for congestion.   No current facility-administered medications for this visit. (Other)   REVIEW OF SYSTEMS: ROS   Positive for: Endocrine, Cardiovascular, Eyes Negative for: Constitutional, Gastrointestinal, Neurological, Skin, Genitourinary, Musculoskeletal, HENT, Respiratory, Psychiatric, Allergic/Imm, Heme/Lymph Last edited by Orval Asberry RAMAN, COA on 06/02/2024  9:35 AM.        ALLERGIES Allergies  Allergen Reactions   Other Other (See Comments)    Cramps to all Cholesterol medications   Statins Other (See Comments)    cramps  UNKNOWN   Atorvastatin Other (See Comments)    Muscle cramps  UNKNOWN   PAST MEDICAL HISTORY Past  Medical History:  Diagnosis Date   Anxiety    Coronary artery disease    Diabetes mellitus without complication (HCC)    GERD (gastroesophageal reflux disease)    Hypertension    Renal disorder    stage 3   Past Surgical History:  Procedure Laterality Date   CATARACT EXTRACTION Bilateral    [place]   CHOLECYSTECTOMY N/A 03/02/2018   Procedure: LAPAROSCOPIC CHOLECYSTECTOMY;  Surgeon: Tanda Locus, MD;  Location: [facility] OR;  Service: General;  Laterality: N/A;   ERCP N/A 06/26/2019   Procedure: ENDOSCOPIC RETROGRADE CHOLANGIOPANCREATOGRAPHY (ERCP);  Surgeon: Saintclair Jasper, MD;  Location: [facility] ENDOSCOPY;  Service: Gastroenterology;  Laterality: N/A;   SPHINCTEROTOMY  06/26/2019   Procedure: SPHINCTEROTOMY;  Surgeon: Saintclair Jasper, MD;  Location: [facility] ENDOSCOPY;  Service: Gastroenterology;;   FAMILY HISTORY Family History  Problem Relation Age of Onset   Heart failure Mother    Diabetes Daughter    SOCIAL HISTORY Social History   Tobacco Use   Smoking status: Never   Smokeless tobacco: Never  Vaping Use   Vaping status: Never Used  Substance Use Topics   Alcohol use: Not Currently   Drug use: Never       OPHTHALMIC EXAM: Base Eye Exam     Visual Acuity (Snellen - Linear)       Right Left   Dist [place] 20/30 20/150         Tonometry (Tonopen, 9:31 AM)       Right Left   Pressure 14 16         Pupils       Dark Light Shape React APD   Right 3 2 Round Brisk None   Left 3 2 Round Brisk None         Visual Fields (Counting fingers)       Left Right    Full Full         Extraocular Movement       Right Left    Full, Ortho Full, Ortho         Neuro/Psych     Oriented x3: Yes   Mood/Affect: Normal         Dilation     Both eyes: 1.0% Mydriacyl, 2.5% Phenylephrine  @ 9:31 AM           Slit Lamp and Fundus Exam     Slit Lamp Exam       Right Left   Lids/Lashes Dermatochalasis - upper lid Dermatochalasis - upper lid, mild MGD, erythema UL  and LL   Conjunctiva/Sclera White and quiet White and quiet   Cornea well healed cataract wound, mild arcus, trace tear film debris 1+ fine PEE, well healed cataract wound   Anterior Chamber Deep and clear deep and clear  Iris Round and moderately dilated Irregular dilation, focal atrophy and TID from 0200-0300   Lens PC IOL in good position with open PC PC IOL in good position with open PC   Anterior Vitreous Vitreous syneresis, Posterior vitreous detachment, vitreous condensations Vitreous syneresis, Posterior vitreous detachment         Fundus Exam       Right Left   Disc mild Pallor, Sharp rim, PPA mild Pallor, mild temporal PPA, mild cupping, sharp rim, tilted, fine vascular loops on ST rim   C/D Ratio 0.5 0.65   Macula Flat, Blunted foveal reflex, mild ERM, RPE mottling, No heme or edema, fine drusen Flat, Blunted foveal reflex, ERM with +cystic changes, persistent central SRF, no heme   Vessels attenuated, Tortuous attenuated, Tortuous   Periphery Attached, blonde fundus, No heme Attached, blonde fundus, persistent punctate MA/IRH superior midzone along superior venule -- BRVO, no edema, reticular degeneration           IMAGING AND PROCEDURES  Imaging and Procedures for 06/02/2024  OCT, Retina - OU - Both Eyes       Right Eye Quality was good. Central Foveal Thickness: 387. Progression has been stable. Findings include no IRF, no SRF, abnormal foveal contour, retinal drusen , epiretinal membrane, macular pucker, vitreomacular adhesion (ERM with Blunted foveal contour and central thickening, partial PVD -- stable).   Left Eye Quality was good. Central Foveal Thickness: 491. Progression has been stable. Findings include abnormal foveal contour, retinal drusen , epiretinal membrane, intraretinal fluid, subretinal fluid, vitreomacular adhesion (Persistent central IRF/SRF, persistent ERM, ellipsoid disruption under ERM).   Notes *Images captured and stored on drive  Diagnosis  / Impression:  ERM OU  OD: ERM with Blunted foveal contour and central thickening, partial PVD -- stable OS: Persistent central IRF/SRF, persistent ERM, ellipsoid disruption under ERM  Clinical management:  See below  Abbreviations: NFP - Normal foveal profile. CME - cystoid macular edema. PED - pigment epithelial detachment. IRF - intraretinal fluid. SRF - subretinal fluid. EZ - ellipsoid zone. ERM - epiretinal membrane. ORA - outer retinal atrophy. ORT - outer retinal tubulation. SRHM - subretinal hyper-reflective material. IRHM - intraretinal hyper-reflective material             ASSESSMENT/PLAN:    ICD-10-CM   1. Epiretinal membrane (ERM) of both eyes  H35.373 OCT, Retina - OU - Both Eyes    2. Cystoid macular edema of left eye  H35.352     3. Essential hypertension  I10     4. Hypertensive retinopathy of both eyes  H35.033     5. Branch retinal vein occlusion of left eye with macular edema  H34.8320     6. Pseudophakia, both eyes  Z96.1     7. Dry eyes  H04.123       1,2. Epiretinal membrane, both eyes  - +ERM w/ blunting of foveal contour OU -- OS with +IRF / persistent cystic changes - FA (11.01.22) shows mild perifoveal staining / leakage OS suggestive of CME component - repeat FA (06.06.23) shows OD: Mild, late peripapillary staining; OS: Delayed venous return of superior venule, punctate perivascular blockage along superior venule -- superior BRVO outside of macula  - started on PF and Prolensa  QID OS on 11.1.22 - s/p STK OS #1 (12.20.22) - BCVA OD: 20/30; OS: 20/150 -- both stable - OCT shows OD: ERM with Blunted foveal contour and central thickening, partial PVD -- stable; OS: Persistent central IRF/SRF, persistent ERM, ellipsoid disruption in  ERM - d/c'd PF and Prolensa  BID OS due to intolerance - STK consent signed 12.20.22 - pt wishes to hold off on ERM surgery - f/u 6 months DFE, OCT  3,4. Hypertensive retinopathy OU  - BP in office 6.6.23 was  171/73 - discussed importance of tight BP control and relationship to vision and BRVO OS - monitor  5. BRVO OS - s/p IVA OS #1 (08.10.23), #2 (09.07.23), #3 (10.06.23), #4 (11.06.23), #5 (12.11.23) -- IVA resistance - s/p IVE OS #1 (01.22.24), #2 (02.26.24), #3 (04.01.24), #4 (05.06.24), #5 (06.03.24), #6 (07.29.24), #7 (09.16.24) - persistent retinal hemorrhages along superior venule, superior to disc -- improved today - FA 6.6.23 shows delayed venous return through superior venule suggestive of BRVO - pt also reports history of recent TIA-like episode (telephone note on 4.12.23) -- pt experienced transient visual field loss and blurred vision that resolved over several minutes  - BP at previous visit (05/05/22) -- 171/73 - all of these factors raise concern / increased risk of possible cardiovascular compromise, I.e. CVA, MI, etc. - discussed findings and case with pt's PCP -- Dr. 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 Pseudophakia OU  - s/p CE/IOL (Dr. Fredda)  - IOLs in good position, doing well  - monitor  7. Dry eyes OU - recommend artificial tears and lubricating ointment as needed - may do better with preservative free drops, given history with Pred Forte  and prolensa   Ophthalmic Meds Ordered this visit:  No orders of the defined types were placed in this encounter.    Return in about 6 months (around 12/03/2024)  for f/u ERM OU, DFE, OCT.  There are no Patient Instructions on file for this visit.  This document serves as a record of services personally performed by Redell JUDITHANN Hans, MD, PhD. It was created on their behalf by Avelina Pereyra, COA an ophthalmic technician. The creation of this record is the provider's dictation and/or activities during the visit.   Electronically signed by: Avelina GORMAN Pereyra, COT  06/06/24  1:55 AM   This document serves as a record of services personally performed by Redell JUDITHANN Hans, MD, PhD. It was created on their behalf by Alan PARAS. Delores, OA an ophthalmic technician. The creation of this record is the provider's dictation and/or activities during the visit.    Electronically signed by: Alan PARAS. Delores, OA 06/06/24 1:55 AM  Redell JUDITHANN Hans, M.D., Ph.D. Diseases & Surgery of the Retina and Vitreous [facility]  I have reviewed the above documentation for accuracy and completeness, and I agree with the above. Redell JUDITHANN Hans, M.D., Ph.D. 06/06/24 1:55 AM   Abbreviations: M myopia (nearsighted); A astigmatism; H hyperopia (farsighted); P presbyopia; Mrx spectacle prescription;  CTL contact lenses; OD right eye; OS left eye; OU both eyes  XT exotropia; ET esotropia; PEK punctate epithelial keratitis; PEE punctate epithelial erosions; DES dry eye syndrome; MGD meibomian gland  dysfunction; ATs artificial tears; PFAT's preservative free artificial tears; NSC nuclear sclerotic cataract; PSC posterior subcapsular cataract; ERM epi-retinal membrane; PVD posterior vitreous detachment; RD retinal detachment; DM diabetes mellitus; DR diabetic retinopathy; NPDR non-proliferative diabetic retinopathy; PDR proliferative diabetic retinopathy; CSME clinically significant macular edema; DME diabetic macular edema; dbh dot blot hemorrhages; CWS cotton wool spot; POAG primary open angle glaucoma; C/D cup-to-disc ratio; HVF humphrey visual field; GVF goldmann visual field;  OCT optical coherence tomography; IOP intraocular pressure; BRVO Branch retinal vein occlusion; CRVO central retinal vein occlusion; CRAO central retinal artery occlusion; BRAO branch retinal artery occlusion; RT retinal tear; SB scleral buckle; PPV pars plana vitrectomy; VH Vitreous hemorrhage; PRP panretinal laser photocoagulation; IVK intravitreal kenalog ; VMT vitreomacular traction; MH Macular hole;  NVD neovascularization of the disc; NVE neovascularization elsewhere; AREDS age related eye disease study; ARMD age related macular degeneration; POAG primary open angle glaucoma; EBMD epithelial/anterior basement membrane dystrophy; ACIOL anterior chamber intraocular lens; IOL intraocular lens; PCIOL posterior chamber intraocular lens; Phaco/IOL phacoemulsification with intraocular lens placement; PRK photorefractive keratectomy; LASIK laser assisted in situ keratomileusis; HTN hypertension; DM diabetes mellitus; COPD chronic obstructive pulmonary disease

## 2024-06-02 ENCOUNTER — Ambulatory Visit (INDEPENDENT_AMBULATORY_CARE_PROVIDER_SITE_OTHER): Admitting: Ophthalmology

## 2024-06-02 ENCOUNTER — Encounter (INDEPENDENT_AMBULATORY_CARE_PROVIDER_SITE_OTHER): Payer: Self-pay | Admitting: Ophthalmology

## 2024-06-02 DIAGNOSIS — H35033 Hypertensive retinopathy, bilateral: Secondary | ICD-10-CM | POA: Diagnosis not present

## 2024-06-02 DIAGNOSIS — Z961 Presence of intraocular lens: Secondary | ICD-10-CM

## 2024-06-02 DIAGNOSIS — H34832 Tributary (branch) retinal vein occlusion, left eye, with macular edema: Secondary | ICD-10-CM

## 2024-06-02 DIAGNOSIS — I10 Essential (primary) hypertension: Secondary | ICD-10-CM

## 2024-06-02 DIAGNOSIS — H04123 Dry eye syndrome of bilateral lacrimal glands: Secondary | ICD-10-CM | POA: Diagnosis not present

## 2024-06-02 DIAGNOSIS — H35373 Puckering of macula, bilateral: Secondary | ICD-10-CM | POA: Diagnosis not present

## 2024-06-02 DIAGNOSIS — H35352 Cystoid macular degeneration, left eye: Secondary | ICD-10-CM

## 2024-06-10 ENCOUNTER — Other Ambulatory Visit: Payer: Self-pay | Admitting: Internal Medicine

## 2024-06-10 ENCOUNTER — Encounter: Payer: Self-pay | Admitting: Internal Medicine

## 2024-06-10 ENCOUNTER — Ambulatory Visit: Admitting: Internal Medicine

## 2024-06-10 VITALS — BP 124/78 | HR 78 | Temp 97.2°F | Resp 18 | Wt 148.2 lb

## 2024-06-10 DIAGNOSIS — N1831 Chronic kidney disease, stage 3a: Secondary | ICD-10-CM | POA: Diagnosis not present

## 2024-06-10 DIAGNOSIS — E782 Mixed hyperlipidemia: Secondary | ICD-10-CM

## 2024-06-10 DIAGNOSIS — E11311 Type 2 diabetes mellitus with unspecified diabetic retinopathy with macular edema: Secondary | ICD-10-CM | POA: Diagnosis not present

## 2024-06-10 DIAGNOSIS — I10 Essential (primary) hypertension: Secondary | ICD-10-CM

## 2024-06-10 NOTE — Progress Notes (Signed)
 Office Visit  Subjective   Patient ID: Nancy Webster   DOB: 14-Feb-1928   Age: 89 y.o.   MRN: [id number]   Chief Complaint Chief Complaint  Patient presents with   Follow-up    3 month follow up     History of Present Illness 89 years old female who is here with follow up. She saw retina doctor and  I have reviewed their notes.  She says that her left eye is better.  She is using reading glasses.    She has diabetes mellitus. She check her sugar at home in the morning. This morning it was 150 to 170 mg/dl. She saw heart doctor.  She says that she is able to do everything by herself without any problem.  She says that her sugar has been little bit high.  Fasting sugar was 165. Her hemoglobin A1c was 7.5 on December 11, 2023. She has retinopathy and left macular edema for that she was getting injection but that was stopped because it was not helping her.  She will see eye doctor yearly now.  She says that her vision is slowly deteriorating.  Her creatinine was 1.293 months ago with GFR of 39.    She has dyslipidemia and take repatha  as she is intolerant to statin. Her LDL in September was target controlled. She take repatha  injection twice a month.  She also has hypertension and take  amlodipine  2.5 mg and metoprolol  25 mg daily, her cardiologist gave her clonidine  to take only when her SBP is high but she says that she ahs not used it.      Past Medical History Past Medical History:  Diagnosis Date   Anxiety    Coronary artery disease    Diabetes mellitus without complication (HCC)    GERD (gastroesophageal reflux disease)    Hypertension    Renal disorder    stage 3     Allergies Allergies  Allergen Reactions   Other Other (See Comments)    Cramps to all Cholesterol medications   Statins Other (See Comments)    cramps  UNKNOWN   Atorvastatin Other (See Comments)    Muscle cramps  UNKNOWN     Review of Systems Review of Systems  Constitutional: Negative.   HENT:  Negative.    Respiratory: Negative.    Cardiovascular: Negative.   Gastrointestinal: Negative.   Neurological: Negative.        Objective:    Vitals BP 124/78   Pulse 78   Temp (!) 97.2 F (36.2 C)   Resp 18   Wt 148 lb 4 oz (67.2 kg)   SpO2 98%   BMI 25.45 kg/m    Physical Examination Physical Exam Constitutional:      Appearance: Normal appearance.  HENT:     Head: Normocephalic and atraumatic.  Cardiovascular:     Rate and Rhythm: Normal rate and regular rhythm.     Heart sounds: Normal heart sounds.  Pulmonary:     Effort: Pulmonary effort is normal.     Breath sounds: Normal breath sounds.  Abdominal:     General: Bowel sounds are normal.     Palpations: Abdomen is soft.  Neurological:     General: No focal deficit present.     Mental Status: She is alert and oriented to person, place, and time.        Assessment & Plan:   Essential hypertension   Her blood pressure is well controlled on amlodipine  2.5 mg  and clonidine  0.1 mg. She also take metoprolol  25 mg daily.  Diabetic retinopathy associated with type 2 diabetes mellitus (HCC)   Her diabetes is fairly good control for 89 years old.  She will continue to follow-up with retina doctor.  I will do hemoglobin A1c today.  Chronic kidney disease, stage 3a (HCC)   She has chronic kidney disease and I will repeat CMP today.  I will also do urine microalbumin  Hyperlipidemia  She takes Repatha  injection twice a month.  She is due for lipid panel.    Return in about 2 months (around 08/10/2024).   Roetta Dare, MD

## 2024-06-11 LAB — COMPREHENSIVE METABOLIC PANEL WITH GFR
ALT: 23 IU/L (ref 0–32)
AST: 26 IU/L (ref 0–40)
Albumin: 4.1 g/dL (ref 3.6–4.6)
Alkaline Phosphatase: 134 IU/L — ABNORMAL HIGH (ref 44–121)
BUN/Creatinine Ratio: 23 (ref 12–28)
BUN: 24 mg/dL (ref 10–36)
Bilirubin Total: 0.2 mg/dL (ref 0.0–1.2)
CO2: 24 mmol/L (ref 20–29)
Calcium: 9.7 mg/dL (ref 8.7–10.3)
Chloride: 102 mmol/L (ref 96–106)
Creatinine, Ser: 1.05 mg/dL — ABNORMAL HIGH (ref 0.57–1.00)
Globulin, Total: 2.6 g/dL (ref 1.5–4.5)
Glucose: 149 mg/dL — ABNORMAL HIGH (ref 70–99)
Potassium: 5.2 mmol/L (ref 3.5–5.2)
Sodium: 141 mmol/L (ref 134–144)
Total Protein: 6.7 g/dL (ref 6.0–8.5)
eGFR: 49 mL/min/1.73 — ABNORMAL LOW (ref >59)

## 2024-06-11 LAB — LIPID PANEL W/O CHOL/HDL RATIO
Cholesterol, Total: 128 mg/dL (ref 100–199)
HDL: 51 mg/dL (ref >39)
LDL Chol Calc (NIH): 48 mg/dL (ref 0–99)
Triglycerides: 173 mg/dL — ABNORMAL HIGH (ref 0–149)
VLDL Cholesterol Cal: 29 mg/dL (ref 5–40)

## 2024-06-11 LAB — HGB A1C W/O EAG: Hgb A1c MFr Bld: 7.8 % — ABNORMAL HIGH (ref 4.8–5.6)

## 2024-06-11 NOTE — Assessment & Plan Note (Signed)
 Her diabetes is fairly good control for 89 years old.  She will continue to follow-up with retina doctor.  I will do hemoglobin A1c today.

## 2024-06-11 NOTE — Assessment & Plan Note (Signed)
 Her blood pressure is well controlled on amlodipine  2.5 mg and clonidine  0.1 mg. She also take metoprolol  25 mg daily.

## 2024-06-11 NOTE — Assessment & Plan Note (Signed)
 She has chronic kidney disease and I will repeat CMP today.  I will also do urine microalbumin

## 2024-06-11 NOTE — Assessment & Plan Note (Signed)
 She takes Repatha  injection twice a month.  She is due for lipid panel.

## 2024-06-12 LAB — MICROALBUMIN / CREATININE URINE RATIO
Creatinine, Urine: 50.2 mg/dL
Microalb/Creat Ratio: 9 mg/g{creat} (ref 0–29)
Microalbumin, Urine: 4.7 ug/mL

## 2024-06-12 LAB — SPECIMEN STATUS REPORT

## 2024-06-16 ENCOUNTER — Ambulatory Visit: Payer: Self-pay

## 2024-06-16 NOTE — Progress Notes (Signed)
Patient called.  Patient aware.    Labs are good

## 2024-06-17 ENCOUNTER — Ambulatory Visit: Payer: Self-pay

## 2024-07-10 ENCOUNTER — Other Ambulatory Visit: Payer: Self-pay | Admitting: Internal Medicine

## 2024-07-10 ENCOUNTER — Ambulatory Visit: Admitting: Internal Medicine

## 2024-07-10 VITALS — BP 130/80 | HR 71 | Temp 97.4°F | Resp 18 | Wt 148.0 lb

## 2024-07-10 DIAGNOSIS — B349 Viral infection, unspecified: Secondary | ICD-10-CM | POA: Diagnosis not present

## 2024-07-10 MED ORDER — AMOXICILLIN 500 MG PO CAPS: 500.0000 mg | ORAL_CAPSULE | Freq: Two times a day (BID) | ORAL | 0 refills | Status: AC

## 2024-07-10 NOTE — Assessment & Plan Note (Signed)
 This could be a viral illness but she states she wants an antibiotic.  She will have a cough first thing in the AM and cough up gray sputum.  Nursing did a COVID-19 test which was negative.  We will give her amoxicillin  and we will check a CMP and CBC on her.

## 2024-07-10 NOTE — Progress Notes (Signed)
 Office Visit  Subjective   Patient ID: Nancy Webster   DOB: June 17, 1928   Age: 89 y.o.   MRN: [id number]   Chief Complaint Chief Complaint  Patient presents with   office visit    Not feeling well since yesterday     History of Present Illness Nancy Webster is a 89 yo female who comes in for an acute visit for feeling sick.  Today Nancy Webster states that yesterday developed soreness of her bilateral tempora area and when Nancy Webster rolls her eyes, they feel sore.  Nancy Webster is also is having fatigue.  Nancy Webster states Nancy Webster started with a clear runny nose with sneezing yesterday.   There is no fevers, chills, sinus congestion, post nasal drip, cough, SOB, wheezing, nausea, vomiting, diarrhea, or myalgias.  Nancy Webster has been on flonase  for years.     Past Medical History Past Medical History:  Diagnosis Date   Anxiety    Coronary artery disease    Diabetes mellitus without complication (HCC)    GERD (gastroesophageal reflux disease)    Hypertension    Renal disorder    stage 3     Allergies Allergies  Allergen Reactions   Other Other (See Comments)    Cramps to all Cholesterol medications   Statins Other (See Comments)    cramps  UNKNOWN   Atorvastatin Other (See Comments)    Muscle cramps  UNKNOWN     Medications  Current Outpatient Medications:    acetaminophen  (TYLENOL ) 500 MG tablet, Take 500 mg by mouth every 8 (eight) hours as needed for mild pain or headache., Disp: , Rfl:    amLODipine  (NORVASC ) 2.5 MG tablet, TAKE 1 TABLET BY MOUTH ONCE DAILY, Disp: 90 tablet, Rfl: 3   Biotin  5000 MCG TABS, Take 1 tablet by mouth daily., Disp: 90 tablet, Rfl: 3   Blood Glucose Monitoring Suppl (FREESTYLE LITE) DEVI, , Disp: , Rfl:    Cholecalciferol (VITAMIN D3) 1.25 MG (50000 UT) TABS, Take 1 tablet by mouth daily., Disp: , Rfl:    cloNIDine  (CATAPRES ) 0.1 MG tablet, Take one tablet (0.1 mg) by mouth as needed for BP on top greater than 165 up to one time a day., Disp: 30 tablet, Rfl: 3   diclofenac Sodium  (VOLTAREN) 1 % GEL, Apply topically., Disp: , Rfl:    fluorouracil (EFUDEX) 5 % cream, Apply 1 Application topically 2 (two) times daily., Disp: , Rfl:    fluticasone  (FLONASE ) 50 MCG/ACT nasal spray, USE 1 SPRAY IN EACH NOSTRIL TWICE DAILY *SHAKE GENTLY*, Disp: 48 g, Rfl: 10   furosemide  (LASIX ) 20 MG tablet, TAKE 1/2 TABLET = 10 MG BY MOUTH ONCE DAILY, Disp: 45 tablet, Rfl: 3   glucose blood (FREESTYLE LITE) test strip, , Disp: , Rfl:    ibuprofen (ADVIL) 200 MG tablet, Take 200 mg by mouth as needed for mild pain or moderate pain., Disp: , Rfl:    magnesium oxide (MAG-OX) 400 (240 Mg) MG tablet, Take 200 mg by mouth daily., Disp: , Rfl:    MEGARED OMEGA-3 KRILL OIL PO, Take 1 tablet by mouth daily., Disp: , Rfl:    metFORMIN (GLUCOPHAGE) 500 MG tablet, TAKE 1 TABLET BY MOUTH TWICE DAILY *TAKE WITH FOOD*, Disp: 180 tablet, Rfl: 2   metoprolol  succinate (TOPROL -XL) 25 MG 24 hr tablet, TAKE 1 TABLET BY MOUTH ONCE DAILY *DO NOT CRUSH OR CHEW*, Disp: 90 tablet, Rfl: 2   Multiple Vitamins-Minerals (CENTRUM SILVER  ADULT 50+) TABS, Take 1 tablet by mouth daily., Disp:  90 tablet, Rfl: 3   pantoprazole  (PROTONIX ) 40 MG tablet, TAKE 1 TABLET BY MOUTH ONCE DAILY, Disp: 90 tablet, Rfl: 3   Polyethyl Glycol-Propyl Glycol (SYSTANE) 0.4-0.3 % SOLN, Place 1 drop into both eyes 2 (two) times daily., Disp: , Rfl:    REPATHA  SURECLICK 140 MG/ML SOAJ, INJECT 140 MG INTO THE SKIN, IN THE ABDOMEN, THIGH, OR OUTER AREA OF UPPER ARM EVERY 14 DAYS, Disp: 2 mL, Rfl: 10   sodium chloride  (OCEAN) 0.65 % SOLN nasal spray, Place 1 spray into both nostrils as needed for congestion., Disp: , Rfl:    Review of Systems Review of Systems  Constitutional:  Negative for chills, fever, malaise/fatigue and weight loss.  HENT:  Negative for congestion, sinus pain and sore throat.   Respiratory:  Negative for cough and shortness of breath.   Cardiovascular:  Negative for chest pain, palpitations and leg swelling.  Gastrointestinal:   Negative for abdominal pain, constipation, diarrhea, nausea and vomiting.  Musculoskeletal:  Negative for myalgias.  Skin:  Negative for itching and rash.  Neurological:  Negative for dizziness, weakness and headaches.       Objective:    Vitals BP 130/80   Pulse 71   Temp (!) 97.4 F (36.3 C)   Resp 18   Wt 148 lb (67.1 kg)   SpO2 98%   BMI 25.40 kg/m    Physical Examination Physical Exam Constitutional:      Appearance: Normal appearance. Nancy Webster is not ill-appearing.  HENT:     Right Ear: Tympanic membrane, ear canal and external ear normal.     Left Ear: Tympanic membrane, ear canal and external ear normal.     Nose: Nose normal. No congestion or rhinorrhea.     Mouth/Throat:     Mouth: Mucous membranes are moist.     Pharynx: Oropharynx is clear. No oropharyngeal exudate or posterior oropharyngeal erythema.  Cardiovascular:     Rate and Rhythm: Normal rate and regular rhythm.     Pulses: Normal pulses.     Heart sounds: No murmur heard.    No friction rub. No gallop.  Pulmonary:     Effort: Pulmonary effort is normal. No respiratory distress.     Breath sounds: No wheezing, rhonchi or rales.  Abdominal:     General: Bowel sounds are normal. There is no distension.     Palpations: Abdomen is soft.     Tenderness: There is no abdominal tenderness.  Musculoskeletal:     Right lower leg: No edema.     Left lower leg: No edema.  Skin:    General: Skin is warm and dry.     Findings: No rash.  Neurological:     Mental Status: Nancy Webster is alert.        Assessment & Plan:   Viral illness This could be a viral illness but Nancy Webster states Nancy Webster wants an antibiotic.  Nancy Webster will have a cough first thing in the AM and cough up gray sputum.  Nursing did a COVID-19 test which was negative.  We will give her amoxicillin  and we will check a CMP and CBC on her.    No follow-ups on file.   Selinda Fleeta Finger, MD

## 2024-07-11 LAB — COMPREHENSIVE METABOLIC PANEL WITH GFR
ALT: 27 IU/L (ref 0–32)
AST: 25 IU/L (ref 0–40)
Albumin: 4.4 g/dL (ref 3.6–4.6)
Alkaline Phosphatase: 88 IU/L (ref 44–121)
BUN/Creatinine Ratio: 23 (ref 12–28)
BUN: 24 mg/dL (ref 10–36)
Bilirubin Total: 0.3 mg/dL (ref 0.0–1.2)
CO2: 25 mmol/L (ref 20–29)
Calcium: 9.8 mg/dL (ref 8.7–10.3)
Chloride: 101 mmol/L (ref 96–106)
Creatinine, Ser: 1.06 mg/dL — ABNORMAL HIGH (ref 0.57–1.00)
Globulin, Total: 2.5 g/dL (ref 1.5–4.5)
Glucose: 120 mg/dL — ABNORMAL HIGH (ref 70–99)
Potassium: 5.8 mmol/L — ABNORMAL HIGH (ref 3.5–5.2)
Sodium: 142 mmol/L (ref 134–144)
Total Protein: 6.9 g/dL (ref 6.0–8.5)
eGFR: 49 mL/min/1.73 — ABNORMAL LOW

## 2024-07-11 LAB — CBC WITH DIFFERENTIAL/PLATELET
Basophils Absolute: 0.1 x10E3/uL (ref 0.0–0.2)
Basos: 1 %
EOS (ABSOLUTE): 0.3 x10E3/uL (ref 0.0–0.4)
Eos: 3 %
Hematocrit: 42.4 % (ref 34.0–46.6)
Hemoglobin: 13 g/dL (ref 11.1–15.9)
Immature Grans (Abs): 0 x10E3/uL (ref 0.0–0.1)
Immature Granulocytes: 0 %
Lymphocytes Absolute: 3.9 x10E3/uL — ABNORMAL HIGH (ref 0.7–3.1)
Lymphs: 36 %
MCH: 31.2 pg (ref 26.6–33.0)
MCHC: 30.7 g/dL — ABNORMAL LOW (ref 31.5–35.7)
MCV: 102 fL — ABNORMAL HIGH (ref 79–97)
Monocytes Absolute: 0.7 x10E3/uL (ref 0.1–0.9)
Monocytes: 7 %
Neutrophils Absolute: 5.9 x10E3/uL (ref 1.4–7.0)
Neutrophils: 53 %
Platelets: 306 x10E3/uL (ref 150–450)
RBC: 4.17 x10E6/uL (ref 3.77–5.28)
RDW: 13.4 % (ref 11.7–15.4)
WBC: 10.9 x10E3/uL — ABNORMAL HIGH (ref 3.4–10.8)

## 2024-07-22 ENCOUNTER — Ambulatory Visit: Payer: Self-pay

## 2024-07-22 NOTE — Progress Notes (Signed)
 Patient called.  Patient aware.  I have called and informed the patient that Dr. Fleeta Finger stated  She has a viral illness on her labs.  She has CKD. SABRA  Pt aware.

## 2024-07-29 ENCOUNTER — Encounter: Payer: Self-pay | Admitting: Internal Medicine

## 2024-07-29 ENCOUNTER — Ambulatory Visit: Admitting: Internal Medicine

## 2024-07-29 VITALS — BP 120/70 | HR 80 | Temp 97.8°F | Resp 18 | Ht 60.0 in | Wt 147.0 lb

## 2024-07-29 DIAGNOSIS — J069 Acute upper respiratory infection, unspecified: Secondary | ICD-10-CM

## 2024-07-29 MED ORDER — OXYMETAZOLINE HCL 0.05 % NA SOLN: 1.0000 | Freq: Two times a day (BID) | NASAL | 0 refills | Status: AC

## 2024-07-29 MED ORDER — FEXOFENADINE HCL 180 MG PO TABS: 180.0000 mg | ORAL_TABLET | Freq: Every day | ORAL | 0 refills | Status: DC

## 2024-07-29 NOTE — Progress Notes (Signed)
   Acute Office Visit  Subjective:     Patient ID: Nancy Webster, female    DOB: 11-14-1927, 89 y.o.   MRN: [id number]  Chief Complaint  Patient presents with   office visit    Patient here for ongoing congestion , covid test negative check at [facility]     HPI Patient is in today for complaining of nasal congestion.  She says that she is using nasal spray but is not going away.  She has postnasal drip.  Review of Systems  Constitutional: Negative.   HENT:  Positive for congestion.   Respiratory: Negative.          Objective:    BP 120/70   Pulse 80   Temp 97.8 F (36.6 C)   Resp 18   Ht 5' (1.524 m)   Wt 147 lb (66.7 kg)   SpO2 96%   BMI 28.71 kg/m    Physical Exam Constitutional:      Appearance: Normal appearance.  Pulmonary:     Effort: Pulmonary effort is normal.     Breath sounds: Normal breath sounds.  Neurological:     Mental Status: She is alert.     No results found for any visits on 07/29/24.      Assessment & Plan:   Problem List Items Addressed This Visit       Respiratory   Upper respiratory tract infection - Primary     She will use Afrin 1 nasal spray to each nostril twice a day and take fexofenadine 1 tablets daily.  If she is not better she will call.       No orders of the defined types were placed in this encounter.   No follow-ups on file.  Roetta Dare, MD

## 2024-08-03 NOTE — Assessment & Plan Note (Signed)
 She will use Afrin 1 nasal spray to each nostril twice a day and take fexofenadine 1 tablets daily.  If she is not better she will call.

## 2024-08-05 ENCOUNTER — Ambulatory Visit: Admitting: Internal Medicine

## 2024-08-21 ENCOUNTER — Ambulatory Visit: Admitting: Internal Medicine

## 2024-08-21 DIAGNOSIS — J40 Bronchitis, not specified as acute or chronic: Secondary | ICD-10-CM

## 2024-08-21 MED ORDER — AMOXICILLIN-POT CLAVULANATE 875-125 MG PO TABS: 1.0000 | ORAL_TABLET | Freq: Two times a day (BID) | ORAL | 0 refills | Status: DC

## 2024-08-21 NOTE — Assessment & Plan Note (Signed)
 She was negative today for COVID-19.  I want her to continue supportive care and use her flonase .  We will start her on augmenitn at this time.

## 2024-08-21 NOTE — Progress Notes (Signed)
 Office Visit  Subjective   Patient ID: Nancy Webster   DOB: [birth year]   Age: 89 y.o.   MRN: [id number]   Chief Complaint No chief complaint on file.    History of Present Illness Mrs. Bruss is a 89 yo female who comes in today for an acute visit for upper respiratory illness.  She states her symptoms began last night where she has sinus congestion with runny nose of clear mucus with past nasal drip and chest congestion with cough productive of gray sputum.  She state she is having some mild SOB but no wheezing, fevers, chills, headaches, myalgias, nausea, vomting, diarrhea or other problems.  She is currently on flonase  for a while and has been using tylenol .  Nursing at [facility] did test for COVID-19 this morning and she was negative.     Past Medical History Past Medical History:  Diagnosis Date   Anxiety    Coronary artery disease    Diabetes mellitus without complication (HCC)    GERD (gastroesophageal reflux disease)    Hypertension    Renal disorder    stage 3     Allergies Allergies  Allergen Reactions   Other Other (See Comments)    Cramps to all Cholesterol medications   Statins Other (See Comments)    cramps  UNKNOWN   Atorvastatin Other (See Comments)    Muscle cramps  UNKNOWN     Medications  Current Outpatient Medications:    acetaminophen  (TYLENOL ) 500 MG tablet, Take 500 mg by mouth every 8 (eight) hours as needed for mild pain or headache., Disp: , Rfl:    amLODipine  (NORVASC ) 2.5 MG tablet, TAKE 1 TABLET BY MOUTH ONCE DAILY, Disp: 90 tablet, Rfl: 3   Biotin  5000 MCG TABS, Take 1 tablet by mouth daily., Disp: 90 tablet, Rfl: 3   Blood Glucose Monitoring Suppl (FREESTYLE LITE) DEVI, , Disp: , Rfl:    Cholecalciferol (VITAMIN D3) 1.25 MG (50000 UT) TABS, Take 1 tablet by mouth daily., Disp: , Rfl:    cloNIDine  (CATAPRES ) 0.1 MG tablet, Take one tablet (0.1 mg) by mouth as needed for BP on top greater than 165 up to one time a day., Disp: 30  tablet, Rfl: 3   diclofenac Sodium (VOLTAREN) 1 % GEL, Apply topically., Disp: , Rfl:    fexofenadine (ALLEGRA) 180 MG tablet, Take 1 tablet (180 mg total) by mouth daily., Disp: 30 tablet, Rfl: 0   fluorouracil (EFUDEX) 5 % cream, Apply 1 Application topically 2 (two) times daily., Disp: , Rfl:    fluticasone  (FLONASE ) 50 MCG/ACT nasal spray, USE 1 SPRAY IN EACH NOSTRIL TWICE DAILY *SHAKE GENTLY*, Disp: 48 g, Rfl: 10   furosemide  (LASIX ) 20 MG tablet, TAKE 1/2 TABLET = 10 MG BY MOUTH ONCE DAILY, Disp: 45 tablet, Rfl: 3   glucose blood (FREESTYLE LITE) test strip, , Disp: , Rfl:    ibuprofen (ADVIL) 200 MG tablet, Take 200 mg by mouth as needed for mild pain or moderate pain., Disp: , Rfl:    magnesium oxide (MAG-OX) 400 (240 Mg) MG tablet, Take 200 mg by mouth daily., Disp: , Rfl:    MEGARED OMEGA-3 KRILL OIL PO, Take 1 tablet by mouth daily., Disp: , Rfl:    metFORMIN (GLUCOPHAGE) 500 MG tablet, TAKE 1 TABLET BY MOUTH TWICE DAILY *TAKE WITH FOOD*, Disp: 180 tablet, Rfl: 2   metoprolol  succinate (TOPROL -XL) 25 MG 24 hr tablet, TAKE 1 TABLET BY MOUTH ONCE DAILY *DO NOT CRUSH OR CHEW*,  Disp: 90 tablet, Rfl: 2   Multiple Vitamins-Minerals (CENTRUM SILVER  ADULT 50+) TABS, Take 1 tablet by mouth daily., Disp: 90 tablet, Rfl: 3   oxymetazoline (AFRIN) 0.05 % nasal spray, Place 1 spray into both nostrils 2 (two) times daily., Disp: 30 mL, Rfl: 0   pantoprazole  (PROTONIX ) 40 MG tablet, TAKE 1 TABLET BY MOUTH ONCE DAILY, Disp: 90 tablet, Rfl: 3   Polyethyl Glycol-Propyl Glycol (SYSTANE) 0.4-0.3 % SOLN, Place 1 drop into both eyes 2 (two) times daily., Disp: , Rfl:    REPATHA  SURECLICK 140 MG/ML SOAJ, INJECT 140 MG INTO THE SKIN, IN THE ABDOMEN, THIGH, OR OUTER AREA OF UPPER ARM EVERY 14 DAYS, Disp: 2 mL, Rfl: 10   sodium chloride  (OCEAN) 0.65 % SOLN nasal spray, Place 1 spray into both nostrils as needed for congestion., Disp: , Rfl:    Review of Systems Review of Systems  Constitutional:  Negative for  chills and fever.  Eyes:  Negative for blurred vision.  Respiratory:  Positive for cough, sputum production and shortness of breath. Negative for wheezing.   Cardiovascular:  Negative for chest pain.  Gastrointestinal:  Negative for abdominal pain, constipation, diarrhea, nausea and vomiting.  Skin:  Negative for rash.  Neurological:  Negative for dizziness, weakness and headaches.       Objective:    Vitals There were no vitals taken for this visit.   Physical Examination Physical Exam     Assessment & Plan:   Bronchitis She was negative today for COVID-19.  I want her to continue supportive care and use her flonase .  We will start her on augmenitn at this time.      No follow-ups on file.   Selinda Fleeta Finger, MD

## 2024-08-25 ENCOUNTER — Other Ambulatory Visit: Payer: Self-pay

## 2024-08-25 ENCOUNTER — Other Ambulatory Visit: Payer: Self-pay | Admitting: Internal Medicine

## 2024-08-25 MED ORDER — AMOXICILLIN-POT CLAVULANATE 875-125 MG PO TABS: 1.0000 | ORAL_TABLET | Freq: Two times a day (BID) | ORAL | 0 refills | Status: DC

## 2024-08-26 ENCOUNTER — Encounter: Payer: Self-pay | Admitting: Internal Medicine

## 2024-08-26 ENCOUNTER — Ambulatory Visit: Admitting: Internal Medicine

## 2024-08-26 VITALS — BP 122/74 | HR 79 | Temp 97.9°F | Resp 18 | Wt 146.0 lb

## 2024-08-26 DIAGNOSIS — I10 Essential (primary) hypertension: Secondary | ICD-10-CM | POA: Diagnosis not present

## 2024-08-26 DIAGNOSIS — E11311 Type 2 diabetes mellitus with unspecified diabetic retinopathy with macular edema: Secondary | ICD-10-CM

## 2024-08-26 NOTE — Progress Notes (Signed)
   Acute Office Visit  Subjective:     Patient ID: Nancy Webster, female    DOB: [birth year], 89 y.o.   MRN: [id number]  Chief Complaint  Patient presents with   office visit    Since Wednesday sugar running high around 172 and today blood pressure 101/47    HPI Patient is in today for high sugar and low blood pressure since Wednesday.  She denies any complaint.  Her blood pressure is 122/74.  Her hemoglobin A1c 2 months ago was 7.8.  She has no hypoglycemia.  She takes metformin for diabetes.    Her blood pressure is also controlled and she takes clonidine .  I have discussed with her that we can stop clonidine  and continue with metoprolol  and monitor his blood pressure.  Review of Systems  Constitutional: Negative.   HENT: Negative.    Respiratory: Negative.    Cardiovascular: Negative.   Gastrointestinal: Negative.   Neurological: Negative.         Objective:    BP 122/74   Pulse 79   Temp 97.9 F (36.6 C)   Resp 18   Wt 146 lb (66.2 kg)   SpO2 97%   BMI 28.51 kg/m    Physical Exam Constitutional:      Appearance: Normal appearance.  Eyes:     Extraocular Movements: Extraocular movements intact.     Pupils: Pupils are equal, round, and reactive to light.  Cardiovascular:     Rate and Rhythm: Normal rate and regular rhythm.     Heart sounds: Normal heart sounds.  Pulmonary:     Effort: Pulmonary effort is normal.     Breath sounds: Normal breath sounds.  Neurological:     Mental Status: She is alert.     No results found for any visits on 08/26/24.      Assessment & Plan:   Problem List Items Addressed This Visit       Cardiovascular and Mediastinum   Essential hypertension - Primary     Her blood pressure is well controlled.  Will continue with current medication.        Endocrine   Diabetic retinopathy associated with type 2 diabetes mellitus (HCC)     She has diabetic retinopathy.  Will continue to monitor her blood sugar.       No  orders of the defined types were placed in this encounter.   No follow-ups on file.  Roetta Dare, MD

## 2024-09-01 ENCOUNTER — Telehealth: Payer: Self-pay | Admitting: Internal Medicine

## 2024-09-01 DIAGNOSIS — I10 Essential (primary) hypertension: Secondary | ICD-10-CM

## 2024-09-01 DIAGNOSIS — I509 Heart failure, unspecified: Secondary | ICD-10-CM

## 2024-09-01 MED ORDER — METOPROLOL SUCCINATE ER 25 MG PO TB24: 12.5000 mg | ORAL_TABLET | Freq: Every day | ORAL | Status: DC

## 2024-09-01 NOTE — Telephone Encounter (Signed)
 Patient called in    Started an antibiotic last week  BP is low 101/ yesterday    THis morning 130/60   Come in for labs (BMET, cortisol, ESR,  CBC)   Keep on lasix    Stop amlodipoine   Cut toprol  in 1/2     Come on Thursday  at around 9:30

## 2024-09-01 NOTE — Addendum Note (Signed)
 Addended by: VICCI ROXIE CROME on: 09/01/2024 06:02 PM   Modules accepted: Orders

## 2024-09-01 NOTE — Telephone Encounter (Signed)
 Spoke with patient and discussed message from Dr. Okey.  Patient verbalized understanding to continue Lasix , stop amlodipine , and decreased Toprol  XL to 12.5 mg daily.  Patient asked if she should have labs drawn prior to office visit or if she can just have drawn at visit on 09/04/24.  Patient will need to call and schedule transportation if labs needed prior to office visit.

## 2024-09-04 ENCOUNTER — Ambulatory Visit (HOSPITAL_COMMUNITY)
Admission: RE | Admit: 2024-09-04 | Discharge: 2024-09-04 | Disposition: A | Discharge status: Home / Self Care | Source: Ambulatory Visit | Attending: Internal Medicine | Admitting: Internal Medicine

## 2024-09-04 ENCOUNTER — Encounter: Payer: Self-pay | Admitting: Internal Medicine

## 2024-09-04 ENCOUNTER — Telehealth: Payer: Self-pay | Admitting: Internal Medicine

## 2024-09-04 ENCOUNTER — Ambulatory Visit (INDEPENDENT_AMBULATORY_CARE_PROVIDER_SITE_OTHER): Admitting: Internal Medicine

## 2024-09-04 VITALS — BP 130/64 | HR 66 | Ht 63.5 in | Wt 152.4 lb

## 2024-09-04 DIAGNOSIS — I10 Essential (primary) hypertension: Secondary | ICD-10-CM

## 2024-09-04 DIAGNOSIS — R2681 Unsteadiness on feet: Secondary | ICD-10-CM

## 2024-09-04 DIAGNOSIS — R059 Cough, unspecified: Secondary | ICD-10-CM | POA: Diagnosis not present

## 2024-09-04 DIAGNOSIS — I509 Heart failure, unspecified: Secondary | ICD-10-CM | POA: Diagnosis not present

## 2024-09-04 MED ORDER — ROLLATOR ULTRA-LIGHT MISC: 1.0000 | Freq: Every day | 0 refills | Status: AC

## 2024-09-04 NOTE — Patient Instructions (Signed)
 Medication Instructions:  Your physician recommends that you continue on your current medications as directed. Please refer to the Current Medication list given to you today.  *If you need a refill on your cardiac medications before your next appointment, please call your pharmacy*  Lab Work: TODAY: BMET, cortisol, CBC, sed rate, TSH If you have labs (blood work) drawn today and your tests are completely normal, you will receive your results only by: MyChart Message (if you have MyChart) OR A paper copy in the mail If you have any lab test that is abnormal or we need to change your treatment, we will call you to review the results.  Testing/Procedures: CHEST X-RAY Your provider has requested you have a chest x-ray performed, you can have this done on the 2nd floor.  CT SCAN Your provider has requested that you have a sinus CT performed.  Follow-Up: At [facility], you and your health needs are our priority.  As part of our continuing mission to provide you with exceptional heart care, our providers are all part of one team.  This team includes your primary Cardiologist (physician) and Advanced Practice Providers or APPs (Physician Assistants and Nurse Practitioners) who all work together to provide you with the care you need, when you need it.  Your next appointment:   To be determined  Provider:   Vina Gull, MD   We recommend signing up for the patient portal called MyChart.  Sign up information is provided on this After Visit Summary.  MyChart is used to connect with patients for Virtual Visits (Telemedicine).  Patients are able to view lab/test results, encounter notes, upcoming appointments, etc.  Non-urgent messages can be sent to your provider as well.    To learn more about what you can do with MyChart, go to [email or web address].

## 2024-09-04 NOTE — Progress Notes (Signed)
 Cardiology Office Note   Date:  09/04/2024   ID:  DELLAR TRABER, DOB 08-07-1928, MRN [id number]  PCP:  Caleen Dirks, MD  Cardiologist:   Vina Gull, MD   Patient presents for eval of dizziness     History of Present Illness: Nancy Webster is a 89 y.o. female with a history of SVT, orthostatic hypotension (improved with change in meds), DM (age 48), HTN, HL, GERD, CKD, retinopathy, trigeminal neuralgia (s/p gamma knife).   I saw the pt in March 2025    Over the summer she complained of dizziness in head   Treated for infection with antibiotics   Didn't get better   The pt called in on 09/01/24   Over the past couple weeks she had a few low BP readings   103/   She felt bad   When I spoke to her about this on 11/3 I told her to cut out amlodipine     Cut back on metoprolol  to 1/2 tab   Keep on lasix    Since seen then she has had one low BP reading    Others have been 110s to 130s  /    She says she still feels weak, afraid to go too far  Not working in garden    Denies CP  No Syncope Still with congestion in head  Current Meds  Medication Sig   acetaminophen  (TYLENOL ) 500 MG tablet Take 500 mg by mouth every 8 (eight) hours as needed for mild pain or headache.   Biotin  5000 MCG TABS Take 1 tablet by mouth daily.   Blood Glucose Monitoring Suppl (FREESTYLE LITE) DEVI    Cholecalciferol (VITAMIN D3) 1.25 MG (50000 UT) TABS Take 1 tablet by mouth daily.   cloNIDine  (CATAPRES ) 0.1 MG tablet Take one tablet (0.1 mg) by mouth as needed for BP on top greater than 165 up to one time a day.   diclofenac Sodium (VOLTAREN) 1 % GEL Apply topically.   fluorouracil (EFUDEX) 5 % cream Apply 1 Application topically 2 (two) times daily.   fluticasone  (FLONASE ) 50 MCG/ACT nasal spray USE 1 SPRAY IN EACH NOSTRIL TWICE DAILY *SHAKE GENTLY*   furosemide  (LASIX ) 20 MG tablet TAKE 1/2 TABLET = 10 MG BY MOUTH ONCE DAILY   glucose blood (FREESTYLE LITE) test strip    ibuprofen (ADVIL) 200 MG tablet Take 200  mg by mouth as needed for mild pain or moderate pain.   magnesium oxide (MAG-OX) 400 (240 Mg) MG tablet Take 200 mg by mouth daily.   MEGARED OMEGA-3 KRILL OIL PO Take 1 tablet by mouth daily.   metFORMIN (GLUCOPHAGE) 500 MG tablet TAKE 1 TABLET BY MOUTH TWICE DAILY *TAKE WITH FOOD*   metoprolol  succinate (TOPROL -XL) 25 MG 24 hr tablet Take 0.5 tablets (12.5 mg total) by mouth daily.   Misc. Devices (ROLLATOR ULTRA-LIGHT) MISC 1 Device by Does not apply route daily.   Multiple Vitamins-Minerals (CENTRUM SILVER  ADULT 50+) TABS Take 1 tablet by mouth daily.   oxymetazoline (AFRIN) 0.05 % nasal spray Place 1 spray into both nostrils 2 (two) times daily.   pantoprazole  (PROTONIX ) 40 MG tablet TAKE 1 TABLET BY MOUTH ONCE DAILY   Polyethyl Glycol-Propyl Glycol (SYSTANE) 0.4-0.3 % SOLN Place 1 drop into both eyes 2 (two) times daily.   REPATHA  SURECLICK 140 MG/ML SOAJ INJECT 140 MG INTO THE SKIN, IN THE ABDOMEN, THIGH, OR OUTER AREA OF UPPER ARM EVERY 14 DAYS   sodium chloride  (OCEAN) 0.65 % SOLN nasal  spray Place 1 spray into both nostrils as needed for congestion.     Allergies:   Other, Statins, and Atorvastatin   Past Medical History:  Diagnosis Date   Anxiety    Coronary artery disease    Diabetes mellitus without complication (HCC)    GERD (gastroesophageal reflux disease)    Hypertension    Renal disorder    stage 3    Past Surgical History:  Procedure Laterality Date   CATARACT EXTRACTION Bilateral    [place]   CHOLECYSTECTOMY N/A 03/02/2018   Procedure: LAPAROSCOPIC CHOLECYSTECTOMY;  Surgeon: Tanda Locus, MD;  Location[facility];  Service: General;  Laterality: N/A;   ERCP N/A 06/26/2019   Procedure: ENDOSCOPIC RETROGRADE CHOLANGIOPANCREATOGRAPHY (ERCP);  Surgeon: Saintclair Jasper, MD;  Location: [facility] ENDOSCOPY;  Service: Gastroenterology;  Laterality: N/A;   SPHINCTEROTOMY  06/26/2019   Procedure: SPHINCTEROTOMY;  Surgeon: Saintclair Jasper, MD;  Location: [facility] ENDOSCOPY;  Service:  Gastroenterology;;     Social History:  The patient  reports that she has never smoked. She has never used smokeless tobacco. She reports that she does not currently use alcohol. She reports that she does not use drugs.   Family History:  The patient's family history includes Diabetes in her daughter; Heart failure in her mother.    ROS:  Please see the history of present illness. All other systems are reviewed and  Negative to the above problem except as noted.    PHYSICAL EXAM: VS:  BP 130/64   Pulse 66   Ht 5' 3.5 (1.613 m)   Wt 152 lb 6.4 oz (69.1 kg)   SpO2 96%   BMI 26.57 kg/m     GEN: Pt is in NAD   HEENT: normal  Neck: JVP is normal , no carotid bruits Cardiac: RRR; no murmurs  No  LE edema  Respiratory: Upper airway wheeze  Clear in lower lung fields GI: soft, nontender,,No hepatomegaly   Ext  No LE edema   EKG:  EKG is not done   Monitor March 2022  Normal sinus rhythm with frequent PACs, rare PVCs. Short runs of SVT. No atrial fibrillation. No sustained pathologic arrhythmias.   Lipid Panel    Component Value Date/Time   CHOL 128 06/10/2024 1412   TRIG 173 (H) 06/10/2024 1412   HDL 51 06/10/2024 1412   CHOLHDL 2.3 07/10/2022 1013   CHOLHDL 4.0 06/24/2019 2207   VLDL 26 06/24/2019 2207   LDLCALC 48 06/10/2024 1412      Wt Readings from Last 3 Encounters:  09/04/24 152 lb 6.4 oz (69.1 kg)  08/26/24 146 lb (66.2 kg)  07/29/24 147 lb (66.7 kg)      ASSESSMENT AND PLAN:  1  Blood pressure   The pt hsa had labile BP readings  Last spring when I saw her it was high   She has been low in the past   ANd, again, she has had some low readings Overall though only had one real low reading  Recomm:   Keep meds the same Stay hydrated      Will get labs today  2  Head dizziness , congestion   Chronic complaint over this summer  Did not improve with Abx    Will get sinus CT   Also set up for chest Xray She has some upper airway wheezing   Denies  reflux    ? If sinus drainage causing irritation  REcomm Sinus lavage Will give Rx for albuterol Continue flonase  l  3  Hx SVT No recurrence   Pt denies palpitaitons   4  HL  LDL 48 in Aug      5  PAD  Pt with significant atherosclerosis of aorta   Follow  Rx risk factors  6  DM   PT on metformin   Last A1C 7.8    Discussed diet      Current medicines are reviewed at length with the patient today.  The patient does not have concerns regarding medicines.  Signed, Vina Gull, MD  09/04/2024 9:51 PM    [facility] [address] [place], [place], [place]  [address] Phone: [phone]; Fax: [phone]

## 2024-09-04 NOTE — Telephone Encounter (Signed)
 Spoke with representative at [facility] do not bill through insurance and recommended referral be sent to [employer] or [employer].  Referral for DME for rollator sent to [employer].

## 2024-09-04 NOTE — Telephone Encounter (Signed)
 Kalie (pharmacist) called stating this pt has a prescription for a rollator walker but they dont handle medical supplies and equipment. She stated the provider would have to contact [facility] at [phone]. She didn't have a fax number to provide for them. Please advise

## 2024-09-05 ENCOUNTER — Ambulatory Visit: Payer: Self-pay | Admitting: Internal Medicine

## 2024-09-05 LAB — TSH: TSH: 2.12 u[IU]/mL (ref 0.450–4.500)

## 2024-09-06 NOTE — Assessment & Plan Note (Signed)
 She has diabetic retinopathy.  Will continue to monitor her blood sugar.

## 2024-09-06 NOTE — Assessment & Plan Note (Signed)
 Her blood pressure is well controlled.  Will continue with current medication.

## 2024-09-08 ENCOUNTER — Telehealth: Payer: Self-pay | Admitting: Internal Medicine

## 2024-09-08 MED ORDER — METOPROLOL SUCCINATE ER 25 MG PO TB24: 12.5000 mg | ORAL_TABLET | Freq: Every day | ORAL | 3 refills | Status: AC

## 2024-09-08 MED ORDER — ALBUTEROL SULFATE HFA 108 (90 BASE) MCG/ACT IN AERS: 2.0000 | INHALATION_SPRAY | Freq: Four times a day (QID) | RESPIRATORY_TRACT | 1 refills | Status: AC | PRN

## 2024-09-08 NOTE — Telephone Encounter (Signed)
 Rx for albuterol inhaler sent to [facility] per Dr. Okey.

## 2024-09-08 NOTE — Telephone Encounter (Signed)
 Left VM at [facility]   Albuterol inhaler  2 puffs every 6 hours as needed  ONe month supply  one refill

## 2024-09-08 NOTE — Telephone Encounter (Signed)
*  STAT* If patient is at the pharmacy, call can be transferred to refill team.   1. Which medications need to be refilled? (please list name of each medication and dose if known) metoprolol  succinate (TOPROL -XL) 25 MG 24 hr tablet    2. Would you like to learn more about the convenience, safety, & potential cost savings by using the [facility]? No      3. Are you open to using the [facility] (Type [facility]. No    4. Which pharmacy/location (including street and city if local pharmacy) is medication to be sent to?[facility] - [place], [place] - [address]    5. Do they need a 30 day or 90 day supply? 90 day   Pharmacy called asking Rx be resent. Please advise.

## 2024-09-08 NOTE — Telephone Encounter (Signed)
 Pharmacy calling to f/u on albuterol inhaler pt thought she was getting a Rx for. Please advise.

## 2024-09-08 NOTE — Telephone Encounter (Signed)
 Pt's medication was sent to pt's pharmacy as requested. Confirmation received.

## 2024-09-09 LAB — BASIC METABOLIC PANEL WITH GFR
BUN/Creatinine Ratio: 22 (ref 12–28)
BUN: 21 mg/dL (ref 10–36)
CO2: 24 mmol/L (ref 20–29)
Calcium: 9.6 mg/dL (ref 8.7–10.3)
Chloride: 101 mmol/L (ref 96–106)
Creatinine, Ser: 0.96 mg/dL (ref 0.57–1.00)
Glucose: 144 mg/dL — ABNORMAL HIGH (ref 70–99)
Potassium: 5.1 mmol/L (ref 3.5–5.2)
Sodium: 142 mmol/L (ref 134–144)
eGFR: 55 mL/min/1.73 — ABNORMAL LOW (ref >59)

## 2024-09-09 LAB — SPECIMEN STATUS REPORT

## 2024-09-09 LAB — CORTISOL: Cortisol: 7.1 ug/dL (ref 6.2–19.4)

## 2024-09-12 ENCOUNTER — Ambulatory Visit (HOSPITAL_COMMUNITY)
Admission: RE | Admit: 2024-09-12 | Discharge: 2024-09-12 | Disposition: A | Discharge status: Home / Self Care | Source: Ambulatory Visit | Attending: Internal Medicine | Admitting: Internal Medicine

## 2024-09-12 DIAGNOSIS — R059 Cough, unspecified: Secondary | ICD-10-CM | POA: Diagnosis not present

## 2024-09-16 ENCOUNTER — Encounter: Payer: Self-pay | Admitting: Internal Medicine

## 2024-09-16 ENCOUNTER — Ambulatory Visit: Admitting: Internal Medicine

## 2024-09-16 VITALS — BP 120/70 | HR 81 | Temp 97.8°F | Resp 18 | Wt 150.0 lb

## 2024-09-16 DIAGNOSIS — N390 Urinary tract infection, site not specified: Secondary | ICD-10-CM | POA: Diagnosis not present

## 2024-09-16 DIAGNOSIS — N760 Acute vaginitis: Secondary | ICD-10-CM | POA: Diagnosis not present

## 2024-09-16 DIAGNOSIS — J0100 Acute maxillary sinusitis, unspecified: Secondary | ICD-10-CM

## 2024-09-16 LAB — POCT URINALYSIS DIPSTICK
Bilirubin, UA: NEGATIVE
Blood, UA: NEGATIVE
Clarity, UA: NEGATIVE
Glucose, UA: NEGATIVE
Ketones, UA: NEGATIVE
Leukocytes, UA: NEGATIVE
Nitrite, UA: NEGATIVE
Protein, UA: NEGATIVE
Spec Grav, UA: 1.01 (ref 1.010–1.025)
Urobilinogen, UA: 0.2 U/dL
pH, UA: 5 (ref 5.0–8.0)

## 2024-09-16 MED ORDER — FLUCONAZOLE 150 MG PO TABS: 150.0000 mg | ORAL_TABLET | Freq: Once | ORAL | 0 refills | Status: AC

## 2024-09-16 MED ORDER — MONTELUKAST SODIUM 10 MG PO TABS: 10.0000 mg | ORAL_TABLET | Freq: Every day | ORAL | 2 refills | Status: AC

## 2024-09-16 NOTE — Assessment & Plan Note (Signed)
 She do not have any signs of urinary tract infection on urine analysis today.  I will give her fluconazole 150 mg x 1.  If that did not help she will call I may have to give her 2 weeks of restarting vaginal cream.

## 2024-09-16 NOTE — Progress Notes (Signed)
   Office Visit  Subjective   Patient ID: Nancy Webster   DOB: 1928-05-19   Age: 89 y.o.   MRN: [id number]   Chief Complaint Chief Complaint  Patient presents with   office visit    Patient here for uti symptoms     History of Present Illness 89 years old female is here c/o still has burning itching. She has finished antibiotic course.  No abdomen pain. No fever or chills. No discharge.  She also say that she has stuffy nose and get SOB. She saw cardiologist Dr. Okey who has ordered CT scan sinus that showed mild swelling of frontal and maxillary sinus. She was given inhalor to use.   Past Medical History Past Medical History:  Diagnosis Date   Anxiety    Coronary artery disease    Diabetes mellitus without complication (HCC)    GERD (gastroesophageal reflux disease)    Hypertension    Renal disorder    stage 3     Allergies Allergies  Allergen Reactions   Other Other (See Comments)    Cramps to all Cholesterol medications   Statins Other (See Comments)    cramps  UNKNOWN   Atorvastatin Other (See Comments)    Muscle cramps  UNKNOWN     Review of Systems Review of Systems  HENT:  Positive for congestion.   Respiratory:  Positive for cough.   Genitourinary:  Positive for dysuria.       Objective:    Vitals BP 120/70   Pulse 81   Temp 97.8 F (36.6 C)   Resp 18   Wt 150 lb (68 kg)   SpO2 95%   BMI 26.15 kg/m    Physical Examination Physical Exam Constitutional:      Appearance: Normal appearance.  HENT:     Head: Normocephalic and atraumatic.  Cardiovascular:     Rate and Rhythm: Normal rate and regular rhythm.     Heart sounds: Normal heart sounds.  Pulmonary:     Effort: Pulmonary effort is normal.     Breath sounds: Normal breath sounds.  Abdominal:     Palpations: Abdomen is soft.  Neurological:     Mental Status: She is alert.        Assessment & Plan:   Acute non-recurrent maxillary sinusitis She will continue to use Flonase   and I will add Singulair 10 mg daily to see if that helps.  Acute vaginitis She do not have any signs of urinary tract infection on urine analysis today.  I will give her fluconazole 150 mg x 1.  If that did not help she will call I may have to give her 2 weeks of restarting vaginal cream.  She told me that Dr. Okey wanted her to talk to me and I have called her office but could not talk to her.  No follow-ups on file.   Roetta Dare, MD

## 2024-09-16 NOTE — Assessment & Plan Note (Signed)
 She will continue to use Flonase  and I will add Singulair 10 mg daily to see if that helps.

## 2024-09-22 DIAGNOSIS — Z23 Encounter for immunization: Secondary | ICD-10-CM | POA: Diagnosis not present

## 2024-09-26 ENCOUNTER — Other Ambulatory Visit: Payer: Self-pay | Admitting: Internal Medicine

## 2024-09-26 MED ORDER — MICONAZOLE NITRATE 200 MG VA SUPP: 200.0000 mg | Freq: Every day | VAGINAL | 0 refills | Status: AC

## 2024-11-20 NOTE — Progress Notes (Shared)
 " [facility] - Clinic Note  12/01/2024     CHIEF COMPLAINT Patient presents for No chief complaint on file.  HISTORY OF PRESENT ILLNESS: Nancy Webster is a 89 y.o. female who presents to the clinic today for:     Pt states she feels like she is having more problems with her eyes, she feels like they are irritated, she is using AT's and allergy drops, she states they do help to soothe her eyes, but don't improve her vision  Referring physician: Leslee Reusing, MD [address] [place],  [place] [address]  HISTORICAL INFORMATION:  Selected notes from the MEDICAL RECORD NUMBER Referred by Dr. McCuen for eval of ERM OU   CURRENT MEDICATIONS: Current Outpatient Medications (Ophthalmic Drugs)  Medication Sig   Polyethyl Glycol-Propyl Glycol (SYSTANE) 0.4-0.3 % SOLN Place 1 drop into both eyes 2 (two) times daily.   No current facility-administered medications for this visit. (Ophthalmic Drugs)   Current Outpatient Medications (Other)  Medication Sig   acetaminophen  (TYLENOL ) 500 MG tablet Take 500 mg by mouth every 8 (eight) hours as needed for mild pain or headache.   albuterol  (VENTOLIN  HFA) 108 (90 Base) MCG/ACT inhaler Inhale 2 puffs into the lungs every 6 (six) hours as needed for wheezing or shortness of breath.   Biotin  5000 MCG TABS Take 1 tablet by mouth daily.   Blood Glucose Monitoring Suppl (FREESTYLE LITE) DEVI    Cholecalciferol (VITAMIN D3) 1.25 MG (50000 UT) TABS Take 1 tablet by mouth daily.   cloNIDine  (CATAPRES ) 0.1 MG tablet Take one tablet (0.1 mg) by mouth as needed for BP on top greater than 165 up to one time a day.   diclofenac Sodium (VOLTAREN) 1 % GEL Apply topically.   fluorouracil (EFUDEX) 5 % cream Apply 1 Application topically 2 (two) times daily.   fluticasone  (FLONASE ) 50 MCG/ACT nasal spray USE 1 SPRAY IN EACH NOSTRIL TWICE DAILY *SHAKE GENTLY*   furosemide  (LASIX ) 20 MG tablet TAKE 1/2 TABLET = 10 MG BY MOUTH ONCE DAILY    glucose blood (FREESTYLE LITE) test strip    ibuprofen (ADVIL) 200 MG tablet Take 200 mg by mouth as needed for mild pain or moderate pain.   magnesium oxide (MAG-OX) 400 (240 Mg) MG tablet Take 200 mg by mouth daily.   MEGARED OMEGA-3 KRILL OIL PO Take 1 tablet by mouth daily.   metFORMIN (GLUCOPHAGE) 500 MG tablet TAKE 1 TABLET BY MOUTH TWICE DAILY *TAKE WITH FOOD*   metoprolol  succinate (TOPROL -XL) 25 MG 24 hr tablet Take 0.5 tablets (12.5 mg total) by mouth daily.   miconazole  (MICOTIN) 200 MG vaginal suppository Place 1 suppository (200 mg total) vaginally at bedtime.   Misc. Devices (ROLLATOR ULTRA-LIGHT) MISC 1 Device by Does not apply route daily.   montelukast  (SINGULAIR ) 10 MG tablet Take 1 tablet (10 mg total) by mouth daily.   Multiple Vitamins-Minerals (CENTRUM SILVER  ADULT 50+) TABS Take 1 tablet by mouth daily.   oxymetazoline  (AFRIN) 0.05 % nasal spray Place 1 spray into both nostrils 2 (two) times daily.   pantoprazole  (PROTONIX ) 40 MG tablet TAKE 1 TABLET BY MOUTH ONCE DAILY   REPATHA  SURECLICK 140 MG/ML SOAJ INJECT 140 MG INTO THE SKIN, IN THE ABDOMEN, THIGH, OR OUTER AREA OF UPPER ARM EVERY 14 DAYS   sodium chloride  (OCEAN) 0.65 % SOLN nasal spray Place 1 spray into both nostrils as needed for congestion.   No current facility-administered medications for this visit. (Other)  REVIEW OF SYSTEMS:      ALLERGIES Allergies  Allergen Reactions   Other Other (See Comments)    Cramps to all Cholesterol medications   Statins Other (See Comments)    cramps  UNKNOWN   Atorvastatin Other (See Comments)    Muscle cramps  UNKNOWN   PAST MEDICAL HISTORY Past Medical History:  Diagnosis Date   Anxiety    Coronary artery disease    Diabetes mellitus without complication (HCC)    GERD (gastroesophageal reflux disease)    Hypertension    Renal disorder    stage 3   Past Surgical History:  Procedure Laterality Date   CATARACT EXTRACTION Bilateral    [place]    CHOLECYSTECTOMY N/A 03/02/2018   Procedure: LAPAROSCOPIC CHOLECYSTECTOMY;  Surgeon: Tanda Locus, MD;  Location: [facility] OR;  Service: General;  Laterality: N/A;   ERCP N/A 06/26/2019   Procedure: ENDOSCOPIC RETROGRADE CHOLANGIOPANCREATOGRAPHY (ERCP);  Surgeon: Saintclair Jasper, MD;  Location: [facility] ENDOSCOPY;  Service: Gastroenterology;  Laterality: N/A;   SPHINCTEROTOMY  06/26/2019   Procedure: SPHINCTEROTOMY;  Surgeon: Saintclair Jasper, MD;  Location: [facility] ENDOSCOPY;  Service: Gastroenterology;;   FAMILY HISTORY Family History  Problem Relation Age of Onset   Heart failure Mother    Diabetes Daughter    SOCIAL HISTORY Social History   Tobacco Use   Smoking status: Never   Smokeless tobacco: Never  Vaping Use   Vaping status: Never Used  Substance Use Topics   Alcohol use: Not Currently   Drug use: Never       OPHTHALMIC EXAM: Not recorded    IMAGING AND PROCEDURES  Imaging and Procedures for 12/01/2024           ASSESSMENT/PLAN:    ICD-10-CM   1. Epiretinal membrane (ERM) of both eyes  H35.373     2. Cystoid macular edema of left eye  H35.352     3. Essential hypertension  I10     4. Hypertensive retinopathy of both eyes  H35.033     5. Branch retinal vein occlusion of left eye with macular edema (HCC)  H34.8320     6. Pseudophakia, both eyes  Z96.1     7. Dry eyes  H04.123        1,2. Epiretinal membrane, both eyes  - +ERM w/ blunting of foveal contour OU -- OS with +IRF / persistent cystic changes - FA (11.01.22) shows mild perifoveal staining / leakage OS suggestive of CME component - repeat FA (06.06.23) shows OD: Mild, late peripapillary staining; OS: Delayed venous return of superior venule, punctate perivascular blockage along superior venule -- superior BRVO outside of macula  - started on PF and Prolensa  QID OS on 11.1.22 - s/p STK OS #1 (12.20.22) - BCVA OD: 20/30; OS: 20/150 -- both stable - OCT shows OD: ERM with Blunted foveal contour and central  thickening, partial PVD -- stable; OS: Persistent central IRF/SRF, persistent ERM, ellipsoid disruption in ERM - d/c'd PF and Prolensa  BID OS due to intolerance - STK consent signed 12.20.22 - pt wishes to hold off on ERM surgery - f/u 6 months DFE, OCT  3,4. Hypertensive retinopathy OU  - BP in office 6.6.23 was 171/73 - discussed importance of tight BP control and relationship to vision and BRVO OS - monitor   5. BRVO OS - s/p IVA OS #1 (08.10.23), #2 (09.07.23), #3 (10.06.23), #4 (11.06.23), #5 (12.11.23) -- IVA resistance - s/p IVE OS #1 (01.22.24), #2 (02.26.24), #3 (04.01.24), #4 (05.06.24), #5 (06.03.24), #6 (  07.29.24), #7 (09.16.24) - persistent retinal hemorrhages along superior venule, superior to disc -- improved today - FA 6.6.23 shows delayed venous return through superior venule suggestive of BRVO - pt also reports history of recent TIA-like episode (telephone note on 4.12.23) -- pt experienced transient visual field loss and blurred vision that resolved over several minutes  - BP at previous visit (05/05/22) -- 171/73 - all of these factors raise concern / increased risk of possible cardiovascular compromise, I.e. CVA, MI, etc. - discussed findings and case with pt's PCP -- Dr. 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 Pseudophakia OU  - s/p CE/IOL (Dr. Fredda)  - IOLs in good position, doing well  - monitor    7. Dry eyes OU - recommend artificial tears and lubricating ointment as needed - may do better with preservative free drops, given history with Pred Forte  and prolensa   Ophthalmic Meds Ordered this visit:  No orders of the defined types were placed in this encounter.    No follow-ups on file.  There are no Patient Instructions on file for this visit.  This document serves as a record of services personally performed by Redell JUDITHANN Hans, MD, PhD. It was created on their behalf by Paulina Jamse Gay an ophthalmic technician. The creation of this record is the provider's dictation and/or activities during the visit.   Electronically signed by: Paulina JONETTA Gay  11/20/24  10:48 AM   Redell JUDITHANN Hans, M.D., Ph.D. Diseases & Surgery of the Retina and Vitreous [facility]  Abbreviations: M myopia (nearsighted); A astigmatism; H hyperopia (farsighted); P presbyopia; Mrx spectacle prescription;  CTL contact lenses; OD right eye; OS left eye; OU both eyes  XT exotropia; ET esotropia; PEK punctate epithelial keratitis; PEE punctate epithelial erosions; DES dry eye syndrome; MGD meibomian gland dysfunction; ATs artificial tears; PFAT's preservative free artificial tears; NSC nuclear sclerotic cataract; PSC posterior subcapsular cataract; ERM epi-retinal membrane; PVD posterior vitreous detachment; RD retinal detachment; DM diabetes mellitus; DR diabetic retinopathy; NPDR non-proliferative diabetic retinopathy; PDR proliferative diabetic retinopathy; CSME clinically significant macular edema; DME diabetic macular edema; dbh dot blot hemorrhages; CWS cotton wool spot; POAG primary open angle glaucoma; C/D cup-to-disc ratio; HVF humphrey visual field; GVF goldmann visual field; OCT optical coherence tomography; IOP intraocular pressure; BRVO Branch retinal vein occlusion; CRVO central retinal vein occlusion; CRAO central retinal artery occlusion; BRAO branch retinal artery occlusion; RT  retinal tear; SB scleral buckle; PPV pars plana vitrectomy; VH Vitreous hemorrhage; PRP panretinal laser photocoagulation; IVK intravitreal kenalog ; VMT vitreomacular traction; MH Macular hole;  NVD neovascularization of the disc; NVE neovascularization elsewhere; AREDS age related eye disease study; ARMD age related macular degeneration; POAG primary open angle glaucoma; EBMD epithelial/anterior basement membrane dystrophy; ACIOL anterior chamber intraocular lens; IOL intraocular lens; PCIOL posterior chamber intraocular lens; Phaco/IOL phacoemulsification with intraocular lens placement; PRK photorefractive keratectomy; LASIK laser assisted in situ keratomileusis; HTN hypertension; DM diabetes  mellitus; COPD chronic obstructive pulmonary disease  "

## 2024-12-01 ENCOUNTER — Encounter (INDEPENDENT_AMBULATORY_CARE_PROVIDER_SITE_OTHER): Admitting: Ophthalmology

## 2024-12-01 DIAGNOSIS — H35352 Cystoid macular degeneration, left eye: Secondary | ICD-10-CM

## 2024-12-01 DIAGNOSIS — H04123 Dry eye syndrome of bilateral lacrimal glands: Secondary | ICD-10-CM

## 2024-12-01 DIAGNOSIS — Z961 Presence of intraocular lens: Secondary | ICD-10-CM

## 2024-12-01 DIAGNOSIS — I10 Essential (primary) hypertension: Secondary | ICD-10-CM

## 2024-12-01 DIAGNOSIS — H34832 Tributary (branch) retinal vein occlusion, left eye, with macular edema: Secondary | ICD-10-CM

## 2024-12-01 DIAGNOSIS — H35373 Puckering of macula, bilateral: Secondary | ICD-10-CM

## 2024-12-01 DIAGNOSIS — H35033 Hypertensive retinopathy, bilateral: Secondary | ICD-10-CM

## 2024-12-02 NOTE — Progress Notes (Shared)
 " [facility] - Clinic Note  12/09/2024     CHIEF COMPLAINT Patient presents for No chief complaint on file.  HISTORY OF PRESENT ILLNESS: Nancy Webster is a 89 y.o. female who presents to the clinic today for:     Pt states she feels like she is having more problems with her eyes, she feels like they are irritated, she is using AT's and allergy drops, she states they do help to soothe her eyes, but don't improve her vision  Referring physician: Leslee Reusing, MD [address] [place],  [place] [address]  HISTORICAL INFORMATION:  Selected notes from the MEDICAL RECORD NUMBER Referred by Dr. McCuen for eval of ERM OU   CURRENT MEDICATIONS: Current Outpatient Medications (Ophthalmic Drugs)  Medication Sig   Polyethyl Glycol-Propyl Glycol (SYSTANE) 0.4-0.3 % SOLN Place 1 drop into both eyes 2 (two) times daily.   No current facility-administered medications for this visit. (Ophthalmic Drugs)   Current Outpatient Medications (Other)  Medication Sig   acetaminophen  (TYLENOL ) 500 MG tablet Take 500 mg by mouth every 8 (eight) hours as needed for mild pain or headache.   albuterol  (VENTOLIN  HFA) 108 (90 Base) MCG/ACT inhaler Inhale 2 puffs into the lungs every 6 (six) hours as needed for wheezing or shortness of breath.   Biotin  5000 MCG TABS Take 1 tablet by mouth daily.   Blood Glucose Monitoring Suppl (FREESTYLE LITE) DEVI    Cholecalciferol (VITAMIN D3) 1.25 MG (50000 UT) TABS Take 1 tablet by mouth daily.   cloNIDine  (CATAPRES ) 0.1 MG tablet Take one tablet (0.1 mg) by mouth as needed for BP on top greater than 165 up to one time a day.   diclofenac Sodium (VOLTAREN) 1 % GEL Apply topically.   fluorouracil (EFUDEX) 5 % cream Apply 1 Application topically 2 (two) times daily.   fluticasone  (FLONASE ) 50 MCG/ACT nasal spray USE 1 SPRAY IN EACH NOSTRIL TWICE DAILY *SHAKE GENTLY*   furosemide  (LASIX ) 20 MG tablet TAKE 1/2 TABLET = 10 MG BY MOUTH ONCE DAILY    glucose blood (FREESTYLE LITE) test strip    ibuprofen (ADVIL) 200 MG tablet Take 200 mg by mouth as needed for mild pain or moderate pain.   magnesium oxide (MAG-OX) 400 (240 Mg) MG tablet Take 200 mg by mouth daily.   MEGARED OMEGA-3 KRILL OIL PO Take 1 tablet by mouth daily.   metFORMIN (GLUCOPHAGE) 500 MG tablet TAKE 1 TABLET BY MOUTH TWICE DAILY *TAKE WITH FOOD*   metoprolol  succinate (TOPROL -XL) 25 MG 24 hr tablet Take 0.5 tablets (12.5 mg total) by mouth daily.   miconazole  (MICOTIN) 200 MG vaginal suppository Place 1 suppository (200 mg total) vaginally at bedtime.   Misc. Devices (ROLLATOR ULTRA-LIGHT) MISC 1 Device by Does not apply route daily.   montelukast  (SINGULAIR ) 10 MG tablet Take 1 tablet (10 mg total) by mouth daily.   Multiple Vitamins-Minerals (CENTRUM SILVER  ADULT 50+) TABS Take 1 tablet by mouth daily.   oxymetazoline  (AFRIN) 0.05 % nasal spray Place 1 spray into both nostrils 2 (two) times daily.   pantoprazole  (PROTONIX ) 40 MG tablet TAKE 1 TABLET BY MOUTH ONCE DAILY   REPATHA  SURECLICK 140 MG/ML SOAJ INJECT 140 MG INTO THE SKIN, IN THE ABDOMEN, THIGH, OR OUTER AREA OF UPPER ARM EVERY 14 DAYS   sodium chloride  (OCEAN) 0.65 % SOLN nasal spray Place 1 spray into both nostrils as needed for congestion.   No current facility-administered medications for this visit. (Other)  REVIEW OF SYSTEMS:      ALLERGIES Allergies  Allergen Reactions   Other Other (See Comments)    Cramps to all Cholesterol medications   Statins Other (See Comments)    cramps  UNKNOWN   Atorvastatin Other (See Comments)    Muscle cramps  UNKNOWN   PAST MEDICAL HISTORY Past Medical History:  Diagnosis Date   Anxiety    Coronary artery disease    Diabetes mellitus without complication (HCC)    GERD (gastroesophageal reflux disease)    Hypertension    Renal disorder    stage 3   Past Surgical History:  Procedure Laterality Date   CATARACT EXTRACTION Bilateral    [place]    CHOLECYSTECTOMY N/A 03/02/2018   Procedure: LAPAROSCOPIC CHOLECYSTECTOMY;  Surgeon: Tanda Locus, MD;  Location: [facility] OR;  Service: General;  Laterality: N/A;   ERCP N/A 06/26/2019   Procedure: ENDOSCOPIC RETROGRADE CHOLANGIOPANCREATOGRAPHY (ERCP);  Surgeon: Saintclair Jasper, MD;  Location: [facility] ENDOSCOPY;  Service: Gastroenterology;  Laterality: N/A;   SPHINCTEROTOMY  06/26/2019   Procedure: SPHINCTEROTOMY;  Surgeon: Saintclair Jasper, MD;  Location: [facility] ENDOSCOPY;  Service: Gastroenterology;;   FAMILY HISTORY Family History  Problem Relation Age of Onset   Heart failure Mother    Diabetes Daughter    SOCIAL HISTORY Social History   Tobacco Use   Smoking status: Never   Smokeless tobacco: Never  Vaping Use   Vaping status: Never Used  Substance Use Topics   Alcohol use: Not Currently   Drug use: Never       OPHTHALMIC EXAM: Not recorded    IMAGING AND PROCEDURES  Imaging and Procedures for 12/09/2024           ASSESSMENT/PLAN:  No diagnosis found.    1,2. Epiretinal membrane, both eyes  - +ERM w/ blunting of foveal contour OU -- OS with +IRF / persistent cystic changes - FA (11.01.22) shows mild perifoveal staining / leakage OS suggestive of CME component - repeat FA (06.06.23) shows OD: Mild, late peripapillary staining; OS: Delayed venous return of superior venule, punctate perivascular blockage along superior venule -- superior BRVO outside of macula  - started on PF and Prolensa  QID OS on 11.1.22 - s/p STK OS #1 (12.20.22) - BCVA OD: 20/30; OS: 20/150 -- both stable - OCT shows OD: ERM with Blunted foveal contour and central thickening, partial PVD -- stable; OS: Persistent central IRF/SRF, persistent ERM, ellipsoid disruption in ERM - d/c'd PF and Prolensa  BID OS due to intolerance - STK consent signed 12.20.22 - pt wishes to hold off on ERM surgery - f/u 6 months DFE, OCT  3,4. Hypertensive retinopathy OU  - BP in office 6.6.23 was 171/73 - discussed importance of  tight BP control and relationship to vision and BRVO OS - monitor   5. BRVO OS - s/p IVA OS #1 (08.10.23), #2 (09.07.23), #3 (10.06.23), #4 (11.06.23), #5 (12.11.23) -- IVA resistance - s/p IVE OS #1 (01.22.24), #2 (02.26.24), #3 (04.01.24), #4 (05.06.24), #5 (06.03.24), #6 (07.29.24), #7 (09.16.24) - persistent retinal hemorrhages along superior venule, superior to disc -- improved today - FA 6.6.23 shows delayed venous return through superior venule suggestive of BRVO - pt also reports history of recent TIA-like episode (telephone note on 4.12.23) -- pt experienced transient visual field loss and blurred vision that resolved over several minutes  - BP at previous visit (05/05/22) -- 171/73 - all of these factors raise concern / increased risk of possible cardiovascular compromise, I.e. CVA, MI, etc. - discussed  findings and case with pt's PCP -- Dr. 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 Pseudophakia OU  - s/p CE/IOL (Dr. Fredda)  - IOLs in good position, doing well  - monitor   7. Dry eyes OU - recommend artificial tears and lubricating ointment as needed - may do better with preservative free drops, given history with Pred Forte  and prolensa   Ophthalmic Meds Ordered this visit:  No orders of the defined types were placed in this encounter.    No follow-ups on file.  There are no Patient Instructions on file for this  visit.  This document serves as a record of services personally performed by Redell JUDITHANN Hans, MD, PhD. It was created on their behalf by Wanda GEANNIE Keens, COT an ophthalmic technician. The creation of this record is the provider's dictation and/or activities during the visit.    Electronically signed by:  Wanda GEANNIE Keens, COT  12/02/24 9:32 AM   Redell JUDITHANN Hans, M.D., Ph.D. Diseases & Surgery of the Retina and Vitreous [facility]  Abbreviations: M myopia (nearsighted); A astigmatism; H hyperopia (farsighted); P presbyopia; Mrx spectacle prescription;  CTL contact lenses; OD right eye; OS left eye; OU both eyes  XT exotropia; ET esotropia; PEK punctate epithelial keratitis; PEE punctate epithelial erosions; DES dry eye syndrome; MGD meibomian gland dysfunction; ATs artificial tears; PFAT's preservative free artificial tears; NSC nuclear sclerotic cataract; PSC posterior subcapsular cataract; ERM epi-retinal membrane; PVD posterior vitreous detachment; RD retinal detachment; DM diabetes mellitus; DR diabetic retinopathy; NPDR non-proliferative diabetic retinopathy; PDR proliferative diabetic retinopathy; CSME clinically significant macular edema; DME diabetic macular edema; dbh dot blot hemorrhages; CWS cotton wool spot; POAG primary open angle glaucoma; C/D cup-to-disc ratio; HVF humphrey visual field; GVF goldmann visual field; OCT optical coherence tomography; IOP intraocular pressure; BRVO Branch retinal vein occlusion; CRVO central retinal vein occlusion; CRAO central retinal artery occlusion; BRAO branch retinal artery occlusion; RT retinal tear; SB scleral buckle; PPV pars plana vitrectomy; VH Vitreous hemorrhage; PRP panretinal laser photocoagulation; IVK intravitreal kenalog ; VMT vitreomacular traction; MH Macular hole;  NVD neovascularization of the disc; NVE neovascularization elsewhere; AREDS age related eye disease study; ARMD age related macular degeneration;  POAG primary open angle glaucoma; EBMD epithelial/anterior basement membrane dystrophy; ACIOL anterior chamber intraocular lens; IOL intraocular lens; PCIOL posterior chamber intraocular lens; Phaco/IOL phacoemulsification with intraocular lens placement; PRK photorefractive keratectomy; LASIK laser assisted in situ keratomileusis; HTN hypertension; DM diabetes mellitus; COPD chronic obstructive pulmonary disease  "

## 2024-12-09 ENCOUNTER — Encounter (INDEPENDENT_AMBULATORY_CARE_PROVIDER_SITE_OTHER): Admitting: Ophthalmology

## 2024-12-09 DIAGNOSIS — H35373 Puckering of macula, bilateral: Secondary | ICD-10-CM

## 2024-12-09 DIAGNOSIS — H34832 Tributary (branch) retinal vein occlusion, left eye, with macular edema: Secondary | ICD-10-CM

## 2024-12-09 DIAGNOSIS — H04123 Dry eye syndrome of bilateral lacrimal glands: Secondary | ICD-10-CM

## 2024-12-09 DIAGNOSIS — H35033 Hypertensive retinopathy, bilateral: Secondary | ICD-10-CM

## 2024-12-09 DIAGNOSIS — H35352 Cystoid macular degeneration, left eye: Secondary | ICD-10-CM

## 2024-12-09 DIAGNOSIS — Z961 Presence of intraocular lens: Secondary | ICD-10-CM

## 2024-12-09 DIAGNOSIS — I10 Essential (primary) hypertension: Secondary | ICD-10-CM
